# Patient Record
Sex: Male | Born: 1955 | Race: White | Hispanic: No | Marital: Married | Smoking: Current every day smoker
Health system: Southern US, Community
[De-identification: ages and names within clinical notes are randomized; demographics above are authoritative.]

## PROBLEM LIST (undated history)

## (undated) DIAGNOSIS — M1990 Unspecified osteoarthritis, unspecified site: Secondary | ICD-10-CM

## (undated) DIAGNOSIS — W3400XA Accidental discharge from unspecified firearms or gun, initial encounter: Secondary | ICD-10-CM

## (undated) DIAGNOSIS — Z9889 Other specified postprocedural states: Secondary | ICD-10-CM

## (undated) DIAGNOSIS — Z8679 Personal history of other diseases of the circulatory system: Secondary | ICD-10-CM

## (undated) DIAGNOSIS — I4819 Other persistent atrial fibrillation: Secondary | ICD-10-CM

## (undated) DIAGNOSIS — Z954 Presence of other heart-valve replacement: Secondary | ICD-10-CM

## (undated) DIAGNOSIS — I428 Other cardiomyopathies: Secondary | ICD-10-CM

## (undated) DIAGNOSIS — I099 Rheumatic heart disease, unspecified: Secondary | ICD-10-CM

## (undated) DIAGNOSIS — I442 Atrioventricular block, complete: Secondary | ICD-10-CM

## (undated) HISTORY — DX: Accidental discharge from unspecified firearms or gun, initial encounter: W34.00XA

## (undated) HISTORY — PX: TONSILLECTOMY: SUR1361

## (undated) HISTORY — DX: Other persistent atrial fibrillation: I48.19

## (undated) HISTORY — DX: Atrioventricular block, complete: I44.2

---
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
## 2011-10-09 ENCOUNTER — Emergency Department (HOSPITAL_COMMUNITY)
Admission: EM | Admit: 2011-10-09 | Discharge: 2011-10-09 | Disposition: A | Discharge status: Home / Self Care | Payer: BC Managed Care – PPO | Attending: Emergency Medicine | Admitting: Emergency Medicine

## 2011-10-09 ENCOUNTER — Encounter: Payer: Self-pay | Admitting: *Deleted

## 2011-10-09 DIAGNOSIS — H53149 Visual discomfort, unspecified: Secondary | ICD-10-CM | POA: Insufficient documentation

## 2011-10-09 DIAGNOSIS — H40219 Acute angle-closure glaucoma, unspecified eye: Secondary | ICD-10-CM | POA: Insufficient documentation

## 2011-10-09 DIAGNOSIS — H11419 Vascular abnormalities of conjunctiva, unspecified eye: Secondary | ICD-10-CM | POA: Insufficient documentation

## 2011-10-09 DIAGNOSIS — H5200 Hypermetropia, unspecified eye: Secondary | ICD-10-CM | POA: Insufficient documentation

## 2011-10-09 DIAGNOSIS — R51 Headache: Secondary | ICD-10-CM | POA: Insufficient documentation

## 2011-10-09 DIAGNOSIS — H409 Unspecified glaucoma: Secondary | ICD-10-CM | POA: Insufficient documentation

## 2011-10-09 DIAGNOSIS — R110 Nausea: Secondary | ICD-10-CM | POA: Insufficient documentation

## 2011-10-09 DIAGNOSIS — H5710 Ocular pain, unspecified eye: Secondary | ICD-10-CM | POA: Insufficient documentation

## 2011-10-09 MED ORDER — BRIMONIDINE TARTRATE 0.15 % OP SOLN
1.0000 [drp] | Freq: Once | OPHTHALMIC | Status: AC
  Administered 2011-10-09: 1 [drp] via OPHTHALMIC
  Filled 2011-10-09: qty 5

## 2011-10-09 MED ORDER — APRACLONIDINE HCL 1 % OP SOLN: 1.0000 [drp] | Freq: Once | OPHTHALMIC | Status: DC

## 2011-10-09 MED ORDER — HYDROCODONE-ACETAMINOPHEN 5-500 MG PO TABS: 1.0000 | ORAL_TABLET | Freq: Four times a day (QID) | ORAL | Status: AC | PRN

## 2011-10-09 MED ORDER — TIMOLOL MALEATE 0.5 % OP SOLN
1.0000 [drp] | Freq: Once | OPHTHALMIC | Status: AC
  Administered 2011-10-09: 1 [drp] via OPHTHALMIC

## 2011-10-09 MED ORDER — TETRACAINE HCL 0.5 % OP SOLN
OPHTHALMIC | Status: AC
  Administered 2011-10-09: 12:00:00
  Filled 2011-10-09: qty 2

## 2011-10-09 MED ORDER — ONDANSETRON HCL 4 MG PO TABS: 4.0000 mg | ORAL_TABLET | Freq: Three times a day (TID) | ORAL | Status: AC | PRN

## 2011-10-09 MED ORDER — FLUORESCEIN SODIUM 1 MG OP STRP
ORAL_STRIP | OPHTHALMIC | Status: AC
  Administered 2011-10-09: 12:00:00
  Filled 2011-10-09: qty 1

## 2011-10-09 MED ORDER — ACETAZOLAMIDE ER 500 MG PO CP12: 500.0000 mg | ORAL_CAPSULE | Freq: Two times a day (BID) | ORAL | Status: AC

## 2011-10-09 MED ORDER — ACETAZOLAMIDE SODIUM 500 MG IJ SOLR
500.0000 mg | Freq: Once | INTRAMUSCULAR | Status: AC
  Administered 2011-10-09: 500 mg via INTRAVENOUS
  Filled 2011-10-09: qty 500

## 2011-10-09 MED ORDER — PILOCARPINE HCL 2 % OP SOLN: 1.0000 [drp] | Freq: Once | OPHTHALMIC | Status: DC

## 2011-10-09 MED ORDER — DORZOLAMIDE HCL 2 % OP SOLN: 1.0000 [drp] | Freq: Two times a day (BID) | OPHTHALMIC | Status: AC

## 2011-10-09 MED ORDER — DORZOLAMIDE HCL 2 % OP SOLN
1.0000 [drp] | Freq: Three times a day (TID) | OPHTHALMIC | Status: DC
  Administered 2011-10-09 (×4): 1 [drp] via OPHTHALMIC
  Filled 2011-10-09: qty 10

## 2011-10-09 MED ORDER — OXYCODONE-ACETAMINOPHEN 5-325 MG PO TABS: 1.0000 | ORAL_TABLET | Freq: Once | ORAL | Status: DC

## 2011-10-09 MED ORDER — PREDNISOLONE ACETATE 1 % OP SUSP
1.0000 [drp] | Freq: Once | OPHTHALMIC | Status: AC
  Administered 2011-10-09: 1 [drp] via OPHTHALMIC
  Filled 2011-10-09: qty 1

## 2011-10-09 MED ORDER — PREDNISOLONE ACETATE 1 % OP SUSP: 1.0000 [drp] | Freq: Four times a day (QID) | OPHTHALMIC | Status: AC

## 2011-10-09 MED ORDER — OXYCODONE-ACETAMINOPHEN 5-325 MG PO TABS
ORAL_TABLET | ORAL | Status: AC
  Administered 2011-10-09: 1 via GASTROSTOMY
  Filled 2011-10-09: qty 1

## 2011-10-09 MED ORDER — OXYCODONE-ACETAMINOPHEN 5-325 MG PO TABS
ORAL_TABLET | ORAL | Status: AC
  Administered 2011-10-09: 1
  Filled 2011-10-09: qty 1

## 2011-10-09 MED ORDER — PILOCARPINE HCL 2 % OP SOLN
1.0000 [drp] | Freq: Once | OPHTHALMIC | Status: AC
  Administered 2011-10-09: 1 [drp] via OPHTHALMIC
  Filled 2011-10-09: qty 15

## 2011-10-09 MED ORDER — DORZOLAMIDE HCL-TIMOLOL MAL 2-0.5 % OP SOLN
1.0000 [drp] | Freq: Once | OPHTHALMIC | Status: AC
  Administered 2011-10-09: 1 [drp] via OPHTHALMIC
  Filled 2011-10-09: qty 10

## 2011-10-09 MED ORDER — TIMOLOL MALEATE 0.5 % OP SOLN: 1.0000 [drp] | Freq: Two times a day (BID) | OPHTHALMIC | Status: AC

## 2011-10-09 MED ORDER — PILOCARPINE HCL 2 % OP SOLN
1.0000 [drp] | Freq: Once | OPHTHALMIC | Status: AC
  Administered 2011-10-09: 1 [drp] via OPHTHALMIC

## 2011-10-09 MED ORDER — ONDANSETRON HCL 4 MG/2ML IJ SOLN
INTRAMUSCULAR | Status: AC
  Filled 2011-10-09: qty 2

## 2011-10-09 MED ORDER — TIMOLOL MALEATE 0.5 % OP SOLN: 1.0000 [drp] | Freq: Once | OPHTHALMIC | Status: DC

## 2011-10-09 MED ORDER — ONDANSETRON HCL 4 MG/2ML IJ SOLN
INTRAMUSCULAR | Status: AC
  Administered 2011-10-09: 4 mg via INTRAVENOUS
  Filled 2011-10-09: qty 2

## 2011-10-09 MED ORDER — PILOCARPINE HCL 2 % OP SOLN: 1.0000 [drp] | Freq: Four times a day (QID) | OPHTHALMIC | Status: AC

## 2011-10-09 MED ORDER — DORZOLAMIDE HCL-TIMOLOL MAL 2-0.5 % OP SOLN
1.0000 [drp] | Freq: Once | OPHTHALMIC | Status: DC
  Filled 2011-10-09: qty 10

## 2011-10-09 MED ORDER — ACETAZOLAMIDE 250 MG PO TABS
500.0000 mg | ORAL_TABLET | Freq: Once | ORAL | Status: AC
  Administered 2011-10-09: 500 mg via ORAL
  Filled 2011-10-09: qty 2

## 2011-10-09 MED ORDER — ONDANSETRON HCL 4 MG/2ML IJ SOLN
4.0000 mg | Freq: Once | INTRAMUSCULAR | Status: DC
  Administered 2011-10-09: 4 mg via INTRAVENOUS

## 2011-10-09 NOTE — ED Notes (Signed)
Pt amb to [facility].  C/o slight nasuea.  Family member at bedside

## 2011-10-09 NOTE — ED Notes (Signed)
Pharmacy notified to bring medicines

## 2011-10-09 NOTE — ED Notes (Signed)
Family at bedside. 

## 2011-10-09 NOTE — ED Notes (Signed)
MD at bedside. 

## 2011-10-09 NOTE — ED Notes (Signed)
[name] [name] has requested to hold meds for now

## 2011-10-09 NOTE — Consult Note (Signed)
Reason for consult:  Angle closure OD.   ED Course: Please see initial ED evaluation and triage note by Dr. Blanca Friend re: patient's presentation and ED course.  Agree with note as documented by Dr. Thane Edu note and initial treatment (including drops as documented, and IV Diamox) as discussed with me by telephone consult.   HPI: [name] [name] is an 55 y.o. male.   Currently patient reports that headache and nausea have improved.  Vision OD seems somewhat improved but still somewhat blurry.  No other complaints.    PMH:  History reviewed. No pertinent past medical history.  History reviewed. No pertinent past surgical history.  History reviewed. No pertinent family history.  Meds:  ASA 81mg , MVI  Social History:  reports that he has been smoking.  He does not have any smokeless tobacco history on file. He reports that he does not drink alcohol or use illicit drugs.  Allergies: No Known Allergies  ROS: negative per 10 systems reviewed other than HPI   Exam:  Blood pressure 136/70, pulse 72, temperature 97.6 F (36.4 C), temperature source Oral, resp. rate 18, height 5\' 10"  (1.778 m), weight 74.844 kg (165 lb), SpO2 98.00%.  Gen: NAD; alert; oriented.  VA cc:  OD 20/200 @ dist    Pupils:   OD mid-dilated, non reactive           OS miotic/pinpoint; nonreactive  IOP (T pen)  OD 23 - 26  OS 9   CVF: OD full to CF   OS full to CF  Motility:  OD full ductions  OS full ductions  Balance/alignment:  Ortho by Phoebe Perch   Slit lamp examination:                                 OD                                       External/adnexa: Normal                                      Lids/lashes:        Normal                                      Conjunctiva        White, quiet        Cornea:              Stromal folds; mild residual microcystic change; clear peripherally                 AC:                     Shallow; no cell; 1+ flare                         Iris:                      Normal        Lens:                  Clear; pigment on [facility]  OS                                       External/adnexa: Normal                                      Lids/lashes:        Normal                                      Conjunctiva        White, quiet        Cornea:              Clear                  AC:                     Narrow; quiet                              Iris:                     Normal        Lens:                  Clear       **Final IOP OD (see ED note re: course of meds):  22, 22 by T Pen 24 by Appl  A/P:  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 - With medical management, acute attack broken successfully as documented above. -- Recommend:  D/C to home with plan for follow up tomorrow with me at 1400 for IOP check. Cosopt (equiv) BID OD Prednisolone acetate QID OD Pilocarpine QID OD Diamox ER 500mg  PO BID.  Anticipate YAG PI OD on Tuesday 10/12/11.     All of the above information was relayed to the patient and/or patient family.  Ophthalmic warning signs and symptoms were reviewed, and clear instructions for immediate phone contact and/or immediate return to the ED or clinic were provided should any of these signs or symptoms occur.  Follow up contact information was provided.  All questions were answered.  Larenda Reedy 10/09/2011, 5:42 PM   [facility] [phone]

## 2011-10-09 NOTE — ED Provider Notes (Signed)
History     CSN: [id number] Arrival date & time: 10/09/2011 10:52 AM   First MD Initiated Contact with Patient 10/09/11 1125      Chief Complaint  Patient presents with  . Eye Problem    (Consider location/radiation/quality/duration/timing/severity/associated sxs/prior treatment) HPI [name] [name] is a 55 year old man with hyperopia who presents with a one day history of R eye pain.  He went to his optometrist [name] [name] yesterday for a regular eye exam for new glasses.  His eyes were dilated during that exam.  When he left the office, his right eye was hurting him a little bit.  This progressed throughout the night, and he awoke with bad R eye pain at 1am and has been awake with pain ever since.  The dilation in his other eye, the L eye, lasted 4-5 hours after his eye exam yesterday.  He also reports nausea, but he has not vomited yet.  He tried using an ice pack, but it did not help much.  Very sensitive to light.  The eye drops he received for dilation at the optometrist office were Tropicamide and phenylephrine per his daughter, who is an ophthalmic technician at [name] [name] office and actually gave these drops to her father yesterday.    Patient is hyperopic.  Per his daughter, his refraction OD is +4.00 -1.50 and OS is +3.00 -0.75.   History reviewed. No pertinent past medical history.  History reviewed. No pertinent past surgical history. No eye surgeries.  History reviewed. No pertinent family history.  Lung cancer in both parents.  History  Substance Use Topics  . Smoking status: Current Everyday Smoker -- 1.0 packs/day  . Smokeless tobacco: Not on file  . Alcohol Use: No     Review of Systems Positive for nausea, no vomiting.  No fever.  No headache.   Allergies  Review of patient's allergies indicates no known allergies.  Home Medications   Current Outpatient Rx  Name Route Sig Dispense Refill  . ASPIRIN EC 81 MG PO TBEC Oral Take 81 mg by mouth every 4  (four) hours as needed. For pain.     [name] [name] PO TABS Oral Take 1 tablet by mouth daily.        BP 132/76  Pulse 61  Temp(Src) 97.6 F (36.4 C) (Oral)  Resp 20  Ht 5\' 10"  (1.778 m)  Wt 165 lb (74.844 kg)  BMI 23.68 kg/m2  SpO2 98%  Physical Exam  Patient keeps his eyes closed unless asked.  Visual Acuity: OD: Count Fingers at 2 ft with glasses (per patient at baseline his right eye sees well) OS: 20/20 with glasses  IOP: OD: 34 mmHg, 28 on recheck, both with tonopen OS: 12 mmHg  Pupils: OD: 5-66mm, non reactive to direct or consensual light OS: 1-51mm, reactive to light stimuli in either eye  Other exam findings: OD: Conjunctival injection.  Marked cornea microcytic edema present.  Lens appears to be anterior and obstructing the pupil.  On slit lamp exam without gonioscope the angles appear partially closed, partially open.  No definite cell or flare in anterior chamber.  Pigment present on anterior lens capsule.  Gonioscopy not performed.  OS: Conjunctiva clear. Cornea clear.  No cell or flare.  Angles are markedly shallow and may be partially closed as well based on slitlamp exam.  Lens does not appear to be obstructing pupil.  Gonioscopy not performed.   ED Course  Procedures (including critical care time)  Pressures OD on initial exam 34,28 mmHg by tonopen Ophthalmology consulted First set of eye drops given 1pm. (Dorzolamide, Timolol, Alphagan, Pilocarpine one drop of each) OD pressure 36,39,40 mmHg at 1:04pm.   Diamox 500mg  PO given.  Patient vomited about 30 min later  OD pressure 37, 24, 29, 40 mmHg at 1:20pm by tonopen Second set of eye drops given 1:25pm.  (Dorzolamide, Timolol, Alphagan, Pilocarpine one drop of each) Extra drop of pilocarpine given per ophtho consultant recommendation.  OD pressure 28, 26, 29, 35 at 1:55pm by tonopen Exam unchanged, R pupil may be slightly smaller but still non reactive and anterior displacement of lens/pupillary  block persists Third set of eye drops given 1:50pm (Dorzolamide, Timolol, Alphagan one drop of each, Pilocarpine two drops)  OD pressure 35,28,28,26 at 2:05pm by tonopen Right pupil ~2mm, seems a bit improved from initial Additional pilocarpine drop given at 2:15pm. Zofran 4mg  IV given at 14:14 Pred Forte 1% one drop given at 15:01 Diamox 500mg  IV given at 15:02.  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  He agreed with our diagnosis and guided the above described therapy regimen to treat this patient's acute angle closure glaucoma.   Assessment/Plan: Patient to be discharged from ED to home Has fu tomorrow with [name] [name] for IOP check.  Will have repeat IOP check Monday and then be scheduled for laser peripheral iridotomy OD on Tuesday with [name] [name] Discharge Eye Drop regimen (all in R eye):   -Timolol BID  -Dorzolamide BID  -Pilocarpine 4x per day  -Pred Forte 4x per day  Diamox ER 500mg  PO BID Zofran ODT 4mg  PO Q6HPRN for nausea Tylenol 3 Q6HPRN for pain  [name] Friend, MD 10/09/11 1630

## 2011-10-09 NOTE — ED Notes (Signed)
Opthomologist at bedside.

## 2011-10-09 NOTE — ED Notes (Signed)
While in [place], pt states he vomited. MD aware and orders recieved

## 2011-10-09 NOTE — ED Notes (Signed)
Pt went to eye doctor yesterday for exam.  Pt's right eye has remained dilated.  He has pain to same.  Pt states he has visual disturbance with same.

## 2016-09-01 DIAGNOSIS — J0100 Acute maxillary sinusitis, unspecified: Secondary | ICD-10-CM | POA: Diagnosis not present

## 2016-09-01 DIAGNOSIS — R05 Cough: Secondary | ICD-10-CM | POA: Diagnosis not present

## 2016-10-02 ENCOUNTER — Encounter (HOSPITAL_COMMUNITY): Payer: Self-pay

## 2016-10-02 ENCOUNTER — Inpatient Hospital Stay (HOSPITAL_COMMUNITY)
Admission: EM | Admit: 2016-10-02 | Discharge: 2016-10-06 | DRG: 308 | Disposition: A | Discharge status: Home / Self Care | Payer: BLUE CROSS/BLUE SHIELD | Attending: Internal Medicine | Admitting: Internal Medicine

## 2016-10-02 DIAGNOSIS — I340 Nonrheumatic mitral (valve) insufficiency: Secondary | ICD-10-CM | POA: Diagnosis not present

## 2016-10-02 DIAGNOSIS — F17200 Nicotine dependence, unspecified, uncomplicated: Secondary | ICD-10-CM | POA: Diagnosis present

## 2016-10-02 DIAGNOSIS — I051 Rheumatic mitral insufficiency: Secondary | ICD-10-CM | POA: Diagnosis not present

## 2016-10-02 DIAGNOSIS — Z791 Long term (current) use of non-steroidal anti-inflammatories (NSAID): Secondary | ICD-10-CM | POA: Diagnosis not present

## 2016-10-02 DIAGNOSIS — I429 Cardiomyopathy, unspecified: Secondary | ICD-10-CM | POA: Diagnosis not present

## 2016-10-02 DIAGNOSIS — Z7982 Long term (current) use of aspirin: Secondary | ICD-10-CM

## 2016-10-02 DIAGNOSIS — Z79899 Other long term (current) drug therapy: Secondary | ICD-10-CM

## 2016-10-02 DIAGNOSIS — I509 Heart failure, unspecified: Secondary | ICD-10-CM

## 2016-10-02 DIAGNOSIS — I5043 Acute on chronic combined systolic (congestive) and diastolic (congestive) heart failure: Secondary | ICD-10-CM | POA: Diagnosis not present

## 2016-10-02 DIAGNOSIS — R0902 Hypoxemia: Secondary | ICD-10-CM

## 2016-10-02 DIAGNOSIS — J90 Pleural effusion, not elsewhere classified: Secondary | ICD-10-CM | POA: Diagnosis not present

## 2016-10-02 DIAGNOSIS — I4891 Unspecified atrial fibrillation: Secondary | ICD-10-CM | POA: Diagnosis not present

## 2016-10-02 DIAGNOSIS — I5041 Acute combined systolic (congestive) and diastolic (congestive) heart failure: Secondary | ICD-10-CM | POA: Diagnosis not present

## 2016-10-02 DIAGNOSIS — I5021 Acute systolic (congestive) heart failure: Secondary | ICD-10-CM | POA: Diagnosis not present

## 2016-10-02 DIAGNOSIS — I480 Paroxysmal atrial fibrillation: Secondary | ICD-10-CM | POA: Diagnosis not present

## 2016-10-02 LAB — BASIC METABOLIC PANEL
ANION GAP: 8 (ref 5–15)
BUN: 16 mg/dL (ref 6–20)
CALCIUM: 8.8 mg/dL — AB (ref 8.9–10.3)
CHLORIDE: 110 mmol/L (ref 101–111)
CO2: 20 mmol/L — AB (ref 22–32)
CREATININE: 0.98 mg/dL (ref 0.61–1.24)
GFR calc non Af Amer: >60 mL/min (ref >60)
GLUCOSE: 143 mg/dL — AB (ref 65–99)
Potassium: 4 mmol/L (ref 3.5–5.1)
Sodium: 138 mmol/L (ref 135–145)

## 2016-10-02 LAB — BRAIN NATRIURETIC PEPTIDE: B NATRIURETIC PEPTIDE 5: 333.2 pg/mL — AB (ref 0.0–100.0)

## 2016-10-02 LAB — CBC
HEMATOCRIT: 40.7 % (ref 39.0–52.0)
HEMOGLOBIN: 13.4 g/dL (ref 13.0–17.0)
MCH: 30.5 pg (ref 26.0–34.0)
MCHC: 32.9 g/dL (ref 30.0–36.0)
MCV: 92.5 fL (ref 78.0–100.0)
Platelets: 218 10*3/uL (ref 150–400)
RBC: 4.4 MIL/uL (ref 4.22–5.81)
RDW: 14.6 % (ref 11.5–15.5)
WBC: 9.3 10*3/uL (ref 4.0–10.5)

## 2016-10-02 LAB — I-STAT TROPONIN, ED: Troponin i, poc: 0.01 ng/mL (ref 0.00–0.08)

## 2016-10-02 LAB — TSH: TSH: 1.303 u[IU]/mL (ref 0.350–4.500)

## 2016-10-02 MED ORDER — FUROSEMIDE 10 MG/ML IJ SOLN
20.0000 mg | Freq: Once | INTRAMUSCULAR | Status: AC
  Administered 2016-10-02: 20 mg via INTRAVENOUS
  Filled 2016-10-02: qty 2

## 2016-10-02 MED ORDER — ACETAMINOPHEN 325 MG PO TABS
650.0000 mg | ORAL_TABLET | ORAL | Status: DC | PRN
  Administered 2016-10-03: 650 mg via ORAL
  Filled 2016-10-02: qty 2

## 2016-10-02 MED ORDER — APIXABAN 5 MG PO TABS
5.0000 mg | ORAL_TABLET | Freq: Two times a day (BID) | ORAL | Status: DC
  Administered 2016-10-02 – 2016-10-06 (×8): 5 mg via ORAL
  Filled 2016-10-02 (×8): qty 1

## 2016-10-02 MED ORDER — DILTIAZEM LOAD VIA INFUSION
20.0000 mg | Freq: Once | INTRAVENOUS | Status: AC
  Administered 2016-10-02: 20 mg via INTRAVENOUS
  Filled 2016-10-02: qty 20

## 2016-10-02 MED ORDER — FUROSEMIDE 20 MG PO TABS
20.0000 mg | ORAL_TABLET | Freq: Once | ORAL | Status: AC
  Administered 2016-10-02: 20 mg via ORAL
  Filled 2016-10-02: qty 1

## 2016-10-02 MED ORDER — DILTIAZEM HCL 100 MG IV SOLR
5.0000 mg/h | INTRAVENOUS | Status: DC
  Administered 2016-10-02 – 2016-10-04 (×4): 7.5 mg/h via INTRAVENOUS
  Administered 2016-10-05: 5 mg/h via INTRAVENOUS
  Filled 2016-10-02 (×4): qty 100

## 2016-10-02 MED ORDER — ONDANSETRON HCL 4 MG/2ML IJ SOLN: 4.0000 mg | Freq: Four times a day (QID) | INTRAMUSCULAR | Status: DC | PRN

## 2016-10-02 MED ORDER — DILTIAZEM HCL 100 MG IV SOLR
5.0000 mg/h | INTRAVENOUS | Status: DC
  Administered 2016-10-02: 5 mg/h via INTRAVENOUS
  Filled 2016-10-02 (×2): qty 100

## 2016-10-02 MED ORDER — GUAIFENESIN-DM 100-10 MG/5ML PO SYRP
5.0000 mL | ORAL_SOLUTION | ORAL | Status: DC | PRN
  Administered 2016-10-02: 5 mL via ORAL
  Filled 2016-10-02: qty 5

## 2016-10-02 NOTE — ED Notes (Signed)
Report called to rn on 2w 

## 2016-10-02 NOTE — ED Triage Notes (Signed)
Pt presents with 1 month h/o shortness of breath and malaise.  Pt reports last night was worse, was seen at Urgent care and referred here for new onset afib.

## 2016-10-02 NOTE — H&P (Signed)
History and Physical   Patient ID: [name] [name], MRN: [id number], DOB: Mar 14, 1956   Date of Encounter: 10/02/2016, 2:34 PM  Primary Care Provider: [facility] Cardiologist: New    Electrophysiologist: new  Chief Complaint:  Atrial fibrillation  History of Present Illness: [name] [name] is a 60 y.o. male presenting to the hospital with acute congestive heart failure and found to be in atrial fibrillation.  He was seen a couple of weeks ago with a 2 week antecedent history of a flulike illness characterized by fever and chills myalgias and cough. He was treated with antibiotics with interval improvement but then worsening over the last couple of weeks. He has noted increasing dyspnea on exertion, persistent cough and last night had orthopnea and nocturnal dyspnea. He has had no palpitations. He has no peripheral edema.  Thromboembolic risk profile is notable for nothing although I worry about cardiomyopathy.  His CHADS-VASc score 0.  He denies symptoms of OSA    Past Medical History:  Diagnosis Date  . Atrial fibrillation (HCC) 10/02/2016   Chads Vasc 0     History reviewed. No pertinent surgical history.    Prior to Admission medications   Medication Sig Start Date End Date Taking? Authorizing Provider  aspirin 325 MG tablet Take 325 mg by mouth every 6 (six) hours as needed for mild pain.   Yes Historical Provider, MD  ibuprofen (ADVIL,MOTRIN) 200 MG tablet Take 200 mg by mouth every 6 (six) hours as needed for moderate pain.   Yes Historical Provider, MD  Multiple Vitamin (MULTIVITAMIN) tablet Take 1 tablet by mouth daily.   Yes Historical Provider, MD      Allergies: No Known Allergies   Social History:  The patient  reports that he has been smoking.  He has been smoking about 1.00 pack per day. He uses smokeless tobacco. He reports that he does not drink alcohol or use drugs.   Family History:  The patient's family history is not on file.     ROS:  Please see the history of present illness.     All other systems reviewed and negative.   Vital Signs: Blood pressure 94/77, pulse 106, temperature 97.7 F (36.5 C), temperature source Oral, resp. rate (!) 32, height 5\' 10"  (1.778 m), weight 171 lb (77.6 kg), SpO2 93 %.  PHYSICAL EXAM: General:  Well nourished, well developed, in no acute distress  HEENT: normal Lymph: no adenopathy Neck: >10 Endocrine:  No thryomegaly Vascular: No carotid bruits; FA pulses 2+ bilaterally without bruits  Cardiac:  Irregularly irregular rate and rhythm with a 2/6 systolic murmur at the apex. PMI is displaced and sustained Lungs:  clear to auscultation bilaterally, no wheezing, rhonchi or rales  Abd: soft, nontender, no hepatomegaly  Ext: no edema Musculoskeletal:  No deformities, BUE and BLE strength normal and equal Skin: warm and dry  Neuro:  CNs 2-12 intact, no focal abnormalities noted Psych:  Normal affect    ECG personally reviewed  Atrial fibrillation with a rapid rate at about 110 Intervals-/09/46 2R in lead V1 Axis 100   Labs:   Lab Results  Component Value Date   WBC 9.3 10/02/2016   HGB 13.4 10/02/2016   HCT 40.7 10/02/2016   MCV 92.5 10/02/2016   PLT 218 10/02/2016     Recent Labs Lab 10/02/16 1100  NA 138  K 4.0  CL 110  CO2 20*  BUN 16  CREATININE 0.98  CALCIUM 8.8*  GLUCOSE 143*  TSH normal  Radiology/Studies:   No results found.    ASSESSMENT AND PLAN:   1. Atrial fibrillation with a rapid ventricular response 2. Acute congestive heart failure 3. Mitral regurgitation ? Functional 4. ? Cardiomyopathy  He will need to be admitted with his congestive heart failure and rapid rate. I reviewed issues with the family related to mechanisms of congestive failure and need for anticoagulation in the short-term and the anticipation that we would undertake cardioversion with TEE guidance on Monday. 1. IV diuresis 2. Continued rate control using  diltiazem 3. Continue anticoagulation with apixoban 4. Echo in a.m. to look at LV function and LA size 5. TEE guided cardioversion on Monday    Tommie ArdSigned,  Steven Klein 10/02/2016 2:34 PM  Pager # [phone]

## 2016-10-02 NOTE — Progress Notes (Signed)
Patient arrived from ED, placed on monitor and ccmd made aware, vital signs obtained patient has diltiazem infusing, will monitor patient .Admiral Marcucci, Randall An RN

## 2016-10-02 NOTE — Progress Notes (Signed)
Attempted to call ED and get report. RN stated would call back. Reeves Dam, Randall An RN

## 2016-10-02 NOTE — ED Notes (Signed)
The pt reports that he feels fine  Still in slow af  Will not keep 02 on  He reports that he does not need it  Visiting with family

## 2016-10-02 NOTE — ED Provider Notes (Signed)
[facility]-EMERGENCY DEPT Provider Note   CSN: [id number] Arrival date & time: 10/02/16  1114     History   Chief Complaint No chief complaint on file.   HPI [name] [name] is a 60 y.o. male.  HPI Patient presents to the emergency room for evaluation of new onset atrial fibrillation. Patient started having some respiratory symptoms several weeks ago. He went to his primary doctor and was diagnosed with a respiratory infection. He was put on a course of antibiotics and felt like his symptoms improved. Over the next week however the symptoms returned and he started to have some mild shortness of breath. This was approximately 3 weeks ago. The symptoms have persisted and became more severe yesterday. He noticed he was getting short of breath doing minimal activities. He had trouble lying flat. He went to an urgent care today. They did a chest x-ray that suggested some pulmonary edema. In the office he had an EKG that showed atrial fibrillation. Patient has no known history of heart disease. Denies any alcohol use. He denies any history of thyroid disease. He denies any trouble with chest pain. No fever, chills or other complaints. Past Medical History:  Diagnosis Date  . Atrial fibrillation (HCC) 10/02/2016   Chads Vasc 0    Patient Active Problem List   Diagnosis Date Noted  . Atrial fibrillation (HCC) 10/02/2016    History reviewed. No pertinent surgical history.     Home Medications    Prior to Admission medications   Medication Sig Start Date End Date Taking? Authorizing Provider  aspirin 325 MG tablet Take 325 mg by mouth every 6 (six) hours as needed for mild pain.   Yes Historical Provider, MD  ibuprofen (ADVIL,MOTRIN) 200 MG tablet Take 200 mg by mouth every 6 (six) hours as needed for moderate pain.   Yes Historical Provider, MD  Multiple Vitamin (MULTIVITAMIN) tablet Take 1 tablet by mouth daily.   Yes Historical Provider, MD    Family History History reviewed. No  pertinent family history.  Social History Social History  Substance Use Topics  . Smoking status: Current Every Day Smoker    Packs/day: 1.00  . Smokeless tobacco: Current User  . Alcohol use No     Allergies   Patient has no known allergies.   Review of Systems Review of Systems  All other systems reviewed and are negative.    Physical Exam Updated Vital Signs BP 108/74   Pulse 68   Temp 97.7 F (36.5 C) (Oral)   Resp (!) 27   Ht 5\' 10"  (1.778 m)   Wt 77.6 kg   SpO2 96%   BMI 24.54 kg/m   Physical Exam  Constitutional: He appears well-developed and well-nourished. No distress.  HENT:  Head: Normocephalic and atraumatic.  Right Ear: External ear normal.  Left Ear: External ear normal.  Eyes: Conjunctivae are normal. Right eye exhibits no discharge. Left eye exhibits no discharge. No scleral icterus.  Neck: Neck supple. No tracheal deviation present. No thyromegaly present.  Cardiovascular: Intact distal pulses.  An irregularly irregular rhythm present. Tachycardia present.   Pulmonary/Chest: Effort normal. No stridor. No respiratory distress. He has no wheezes. He has rales.  Bilateral   Abdominal: Soft. Bowel sounds are normal. He exhibits no distension. There is no tenderness. There is no rebound and no guarding.  Musculoskeletal: He exhibits no edema or tenderness.  Neurological: He is alert. He has normal strength. No cranial nerve deficit (no facial droop, extraocular movements intact,  no slurred speech) or sensory deficit. He exhibits normal muscle tone. He displays no seizure activity. Coordination normal.  Skin: Skin is warm and dry. No rash noted. He is not diaphoretic.  Psychiatric: He has a normal mood and affect.  Nursing note and vitals reviewed.    ED Treatments / Results  Labs (all labs ordered are listed, but only abnormal results are displayed) Labs Reviewed  BASIC METABOLIC PANEL - Abnormal; Notable for the following:       Result Value     CO2 20 (*)    Glucose, Bld 143 (*)    Calcium 8.8 (*)    All other components within normal limits  BRAIN NATRIURETIC PEPTIDE - Abnormal; Notable for the following:    B Natriuretic Peptide 333.2 (*)    All other components within normal limits  CBC  TSH  I-STAT TROPOININ, ED    EKG  EKG Interpretation  Date/Time:  Saturday October 02 2016 11:27:45 EST Ventricular Rate:  149 PR Interval:    QRS Duration: 88 QT Interval:  297 QTC Calculation: 468 R Axis:   100 Text Interpretation:  Atrial fibrillation Ventricular premature complex Right axis deviation Borderline T wave abnormalities No old tracing to compare Confirmed by Solene Hereford  MD-J, Mikle Sternberg ([id number]) on 10/02/2016 11:30:16 AM       Radiology UC CXR reviewed,  Image is available on Disc  Procedures Procedures (including critical care time)  Medications Ordered in ED Medications  diltiazem (CARDIZEM) 1 mg/mL load via infusion 20 mg (20 mg Intravenous Bolus from Bag 10/02/16 1148)    And  diltiazem (CARDIZEM) 100 mg in dextrose 5 % 100 mL (1 mg/mL) infusion (7.5 mg/hr Intravenous Rate/Dose Change 10/02/16 1243)  furosemide (LASIX) tablet 20 mg (20 mg Oral Given 10/02/16 1306)     Initial Impression / Assessment and Plan / ED Course  I have reviewed the triage vital signs and the nursing notes.  Pertinent labs & imaging results that were available during my care of the patient were reviewed by me and considered in my medical decision making (see chart for details).  Clinical Course as of Oct 02 1620  Sat Oct 02, 2016  1145 CXR from the UC reviewed.  Vascular congestion, bilateral pleural effusions  [JK]  1152 Patient's EKG is consistent with atrial fibrillation with a rapid ventricular rate. I will start the patient Cardizem drip.  He is not a candidate for ED cardioversion considering the long duration of his symptoms.`  [JK]  1230 HR has responded to cardizem drip.  Now in the low 100s.  Initial labs are reassuring.   Troponin normal.  Will consult with cardiology  [JK]  1251 Discussed with Dr Diona Browner. Will see pt in the ED  [JK]    Clinical Course User Index [JK] Linwood Dibbles, MD    Final Clinical Impressions(s) / ED Diagnoses   Final diagnoses:  Atrial fibrillation with rapid ventricular response [facility])    New Prescriptions New Prescriptions   No medications on file     Linwood Dibbles, MD 10/02/16 1621

## 2016-10-02 NOTE — ED Notes (Signed)
Pt c/o a burning sensation in his chest that has been there all day

## 2016-10-03 ENCOUNTER — Inpatient Hospital Stay (HOSPITAL_COMMUNITY): Payer: BLUE CROSS/BLUE SHIELD

## 2016-10-03 DIAGNOSIS — I4891 Unspecified atrial fibrillation: Secondary | ICD-10-CM

## 2016-10-03 LAB — ECHOCARDIOGRAM COMPLETE
HEIGHTINCHES: 70 in
Weight: 2736 oz

## 2016-10-03 LAB — BASIC METABOLIC PANEL
ANION GAP: 9 (ref 5–15)
BUN: 13 mg/dL (ref 6–20)
CALCIUM: 8.9 mg/dL (ref 8.9–10.3)
CO2: 26 mmol/L (ref 22–32)
Chloride: 105 mmol/L (ref 101–111)
Creatinine, Ser: 1.1 mg/dL (ref 0.61–1.24)
Glucose, Bld: 109 mg/dL — ABNORMAL HIGH (ref 65–99)
POTASSIUM: 4.3 mmol/L (ref 3.5–5.1)
Sodium: 140 mmol/L (ref 135–145)

## 2016-10-03 MED ORDER — ALPRAZOLAM 0.25 MG PO TABS
0.2500 mg | ORAL_TABLET | Freq: Every evening | ORAL | Status: AC | PRN
  Administered 2016-10-03: 0.25 mg via ORAL
  Filled 2016-10-03: qty 1

## 2016-10-03 MED ORDER — ALPRAZOLAM 0.25 MG PO TABS
0.2500 mg | ORAL_TABLET | Freq: Once | ORAL | Status: AC
  Administered 2016-10-03: 0.25 mg via ORAL
  Filled 2016-10-03: qty 1

## 2016-10-03 NOTE — Progress Notes (Signed)
Pt has six bits of SVT, per CCMD. PT alert, oriented in no apparent distress. Asymptomatic, we'll continue to monitor.

## 2016-10-03 NOTE — Progress Notes (Signed)
Patient Name: [name] KocherJohn P Maron Date of Encounter: 10/03/2016    Hospital Problem List     Active Problems:   Atrial fibrillation (HCC)     Subjective   Rate control improved. Out 2.1L. Feeling much improved with less SOB.  Inpatient Medications    Scheduled Meds: . apixaban  5 mg Oral BID   Continuous Infusions: . diltiazem (CARDIZEM) infusion 7.5 mg/hr (10/03/16 0631)   PRN Meds: acetaminophen, guaiFENesin-dextromethorphan, ondansetron (ZOFRAN) IV   Vital Signs    Vitals:   10/02/16 1645 10/02/16 1824 10/02/16 2036 10/03/16 0700  BP: 99/82 113/71 117/75 114/60  Pulse: 105 100 (!) 104 81  Resp: (!) 28 20 20 18   Temp:  98.2 F (36.8 C) 97.9 F (36.6 C) 97.8 F (36.6 C)  TempSrc:  Oral Oral Oral  SpO2: 93% 92% 92% 93%  Weight:      Height:        Intake/Output Summary (Last 24 hours) at 10/03/16 1028 Last data filed at 10/03/16 1000  Gross per 24 hour  Intake           419.63 ml  Output             2550 ml  Net         -2130.37 ml   Filed Weights   10/02/16 1127  Weight: 171 lb (77.6 kg)    Physical Exam    GEN: Well nourished, well developed, in no acute distress.  HEENT: Grossly normal.  Neck: Supple, no JVD, carotid bruits, or masses. Cardiac: iRRR, 2/6  Murmur at the apexrubs, or gallops. No clubbing, cyanosis, edema.  Radials/DP/PT 2+ and equal bilaterally.  Respiratory:  Respirations regular and unlabored, clear to auscultation bilaterally. GI: Soft, nontender, nondistended, BS + x 4. MS: no deformity or atrophy. Skin: warm and dry, no rash. Neuro:  Strength and sensation are intact. Psych: AAOx3.  Normal affect.  Labs    CBC  Recent Labs  10/02/16 1100  WBC 9.3  HGB 13.4  HCT 40.7  MCV 92.5  PLT 218   Basic Metabolic Panel  Recent Labs  10/02/16 1100 10/03/16 0215  NA 138 140  K 4.0 4.3  CL 110 105  CO2 20* 26  GLUCOSE 143* 109*  BUN 16 13  CREATININE 0.98 1.10  CALCIUM 8.8* 8.9   Liver Function Tests No results  for input(s): AST, ALT, ALKPHOS, BILITOT, PROT, ALBUMIN in the last 72 hours. No results for input(s): LIPASE, AMYLASE in the last 72 hours. Cardiac Enzymes No results for input(s): CKTOTAL, CKMB, CKMBINDEX, TROPONINI in the last 72 hours. BNP Invalid input(s): POCBNP D-Dimer No results for input(s): DDIMER in the last 72 hours. Hemoglobin A1C No results for input(s): HGBA1C in the last 72 hours. Fasting Lipid Panel No results for input(s): CHOL, HDL, LDLCALC, TRIG, CHOLHDL, LDLDIRECT in the last 72 hours. Thyroid Function Tests  Recent Labs  10/02/16 1100  TSH 1.303    Telemetry    Atrial fibrillaiton - Personally Reviewed  ECG    Atrial fibrillation, RAD - Personally Reviewed  Radiology    No results found.  Cardiac Studies   TTE pending  Patient Profile     [name] KocherJohn P [name] is a 60 y.o. male presenting to the hospital with acute congestive heart failure and found to be in atrial fibrillation.  Assessment & Plan    1. Atrial fibrillation: has been started on Eliquis for anticoagulation. Plan for TEE/CV tomorrow. Rate control with diltiazem.  2.  Acute heart failure: appears to be in HF with PND and orthopnea. TTE pending. Continue diuresis.  Out 2.1L.  Signed, Onda Kattner Jorja Loa, MD  10/03/2016, 10:28 AM

## 2016-10-03 NOTE — Discharge Instructions (Addendum)
Atrial Fibrillation Atrial fibrillation is a type of irregular or rapid heartbeat (arrhythmia). In atrial fibrillation, the heart quivers continuously in a chaotic pattern. This occurs when parts of the heart receive disorganized signals that make the heart unable to pump blood normally. This can increase the risk for stroke, heart failure, and other heart-related conditions. There are different types of atrial fibrillation, including:  Paroxysmal atrial fibrillation. This type starts suddenly, and it usually stops on its own shortly after it starts.  Persistent atrial fibrillation. This type often lasts longer than a week. It may stop on its own or with treatment.  Long-lasting persistent atrial fibrillation. This type lasts longer than 12 months.  Permanent atrial fibrillation. This type does not go away. Talk with your health care provider to learn about the type of atrial fibrillation that you have. What are the causes? This condition is caused by some heart-related conditions or procedures, including:  A heart attack.  Coronary artery disease.  Heart failure.  Heart valve conditions.  High blood pressure.  Inflammation of the sac that surrounds the heart (pericarditis).  Heart surgery.  Certain heart rhythm disorders, such as Wolf-Parkinson-White syndrome. Other causes include:  Pneumonia.  Obstructive sleep apnea.  Blockage of an artery in the lungs (pulmonary embolism, or PE).  Lung cancer.  Chronic lung disease.  Thyroid problems, especially if the thyroid is overactive (hyperthyroidism).  Caffeine.  Excessive alcohol use or illegal drug use.  Use of some medicines, including certain decongestants and diet pills. Sometimes, the cause cannot be found. What increases the risk? This condition is more likely to develop in:  People who are older in age.  People who smoke.  People who have diabetes mellitus.  People who are overweight (obese).  Athletes  who exercise vigorously. What are the signs or symptoms? Symptoms of this condition include:  A feeling that your heart is beating rapidly or irregularly.  A feeling of discomfort or pain in your chest.  Shortness of breath.  Sudden light-headedness or weakness.  Getting tired easily during exercise. In some cases, there are no symptoms. How is this diagnosed? Your health care provider may be able to detect atrial fibrillation when taking your pulse. If detected, this condition may be diagnosed with:  An electrocardiogram (ECG).  A Holter monitor test that records your heartbeat patterns over a 24-hour period.  Transthoracic echocardiogram (TTE) to evaluate how blood flows through your heart.  Transesophageal echocardiogram (TEE) to view more detailed images of your heart.  A stress test.  Imaging tests, such as a CT scan or chest X-ray.  Blood tests. How is this treated? The main goals of treatment are to prevent blood clots from forming and to keep your heart beating at a normal rate and rhythm. The type of treatment that you receive depends on many factors, such as your underlying medical conditions and how you feel when you are experiencing atrial fibrillation. This condition may be treated with:  Medicine to slow down the heart rate, bring the hearts rhythm back to normal, or prevent clots from forming.  Electrical cardioversion. This is a procedure that resets your hearts rhythm by delivering a controlled, low-energy shock to the heart through your skin.  Different types of ablation, such as catheter ablation, catheter ablation with pacemaker, or surgical ablation. These procedures destroy the heart tissues that send abnormal signals. When the pacemaker is used, it is placed under your skin to help your heart beat in a regular rhythm. Follow these instructions  at home:  Take over-the counter and prescription medicines only as told by your health care provider.  If  your health care provider prescribed a blood-thinning medicine (anticoagulant), take it exactly as told. Taking too much blood-thinning medicine can cause bleeding. If you do not take enough blood-thinning medicine, you will not have the protection that you need against stroke and other problems.  Do not use tobacco products, including cigarettes, chewing tobacco, and e-cigarettes. If you need help quitting, ask your health care provider.  If you have obstructive sleep apnea, manage your condition as told by your health care provider.  Do not drink alcohol.  Do not drink beverages that contain caffeine, such as coffee, soda, and tea.  Maintain a healthy weight. Do not use diet pills unless your health care provider approves. Diet pills may make heart problems worse.  Follow diet instructions as told by your health care provider.  Exercise regularly as told by your health care provider.  Keep all follow-up visits as told by your health care provider. This is important. How is this prevented?  Avoid drinking beverages that contain caffeine or alcohol.  Avoid certain medicines, especially medicines that are used for breathing problems.  Avoid certain herbs and herbal medicines, such as those that contain ephedra or ginseng.  Do not use illegal drugs, such as cocaine and amphetamines.  Do not smoke.  Manage your high blood pressure. Contact a health care provider if:  You notice a change in the rate, rhythm, or strength of your heartbeat.  You are taking an anticoagulant and you notice increased bruising.  You tire more easily when you exercise or exert yourself. Get help right away if:  You have chest pain, abdominal pain, sweating, or weakness.  You feel nauseous.  You notice blood in your vomit, bowel movement, or urine.  You have shortness of breath.  You suddenly have swollen feet and ankles.  You feel dizzy.  You have sudden weakness or numbness of the face, arm,  or leg, especially on one side of the body.  You have trouble speaking, trouble understanding, or both (aphasia).  Your face or your eyelid droops on one side. These symptoms may represent a serious problem that is an emergency. Do not wait to see if the symptoms will go away. Get medical help right away. Call your local emergency services (911 in the [place] Do not drive yourself to the hospital.  This information is not intended to replace advice given to you by your health care provider. Make sure you discuss any questions you have with your health care provider. Document Released: 10/25/2005 Document Revised: 03/03/2016 Document Reviewed: 02/19/2015 Elsevier Interactive Patient Education  2017 [employer] Information on my medicine - ELIQUIS (apixaban)  This medication education was reviewed with me or my healthcare representative as part of my discharge preparation.  The pharmacist that spoke with me during my hospital stay was:  Benny LennertMeyer, Andrew David, [facility]  Why was Eliquis prescribed for you? Eliquis was prescribed for you to reduce the risk of a blood clot forming that can cause a stroke if you have a medical condition called atrial fibrillation (a type of irregular heartbeat).  What do You need to know about Eliquis ? Take your Eliquis TWICE DAILY - one tablet in the morning and one tablet in the evening with or without food. If you have difficulty swallowing the tablet whole please discuss with your pharmacist how to take the medication safely.  Take Eliquis exactly as  prescribed by your doctor and DO NOT stop taking Eliquis without talking to the doctor who prescribed the medication.  Stopping may increase your risk of developing a stroke.  Refill your prescription before you run out.  After discharge, you should have regular check-up appointments with your healthcare provider that is prescribing your Eliquis.  In the future your dose may need to be changed if your kidney  function or weight changes by a significant amount or as you get older.  What do you do if you miss a dose? If you miss a dose, take it as soon as you remember on the same day and resume taking twice daily.  Do not take more than one dose of ELIQUIS at the same time to make up a missed dose.  Important Safety Information A possible side effect of Eliquis is bleeding. You should call your healthcare provider right away if you experience any of the following: ? Bleeding from an injury or your nose that does not stop. ? Unusual colored urine (red or dark brown) or unusual colored stools (red or black). ? Unusual bruising for unknown reasons. ? A serious fall or if you hit your head (even if there is no bleeding).  Some medicines may interact with Eliquis and might increase your risk of bleeding or clotting while on Eliquis. To help avoid this, consult your healthcare provider or pharmacist prior to using any new prescription or non-prescription medications, including herbals, vitamins, non-steroidal anti-inflammatory drugs (NSAIDs) and supplements.  This website has more information on Eliquis (apixaban): http://www.eliquis.com/eliquis/home

## 2016-10-04 ENCOUNTER — Inpatient Hospital Stay (HOSPITAL_COMMUNITY): Payer: BLUE CROSS/BLUE SHIELD

## 2016-10-04 ENCOUNTER — Encounter (HOSPITAL_COMMUNITY)
Admission: EM | Disposition: A | Discharge status: Home / Self Care | Payer: Self-pay | Source: Home / Self Care | Attending: Internal Medicine

## 2016-10-04 ENCOUNTER — Encounter (HOSPITAL_COMMUNITY): Payer: Self-pay

## 2016-10-04 ENCOUNTER — Inpatient Hospital Stay (HOSPITAL_COMMUNITY): Payer: BLUE CROSS/BLUE SHIELD | Admitting: Certified Registered Nurse Anesthetist

## 2016-10-04 DIAGNOSIS — I5041 Acute combined systolic (congestive) and diastolic (congestive) heart failure: Secondary | ICD-10-CM

## 2016-10-04 DIAGNOSIS — I051 Rheumatic mitral insufficiency: Secondary | ICD-10-CM

## 2016-10-04 DIAGNOSIS — I4891 Unspecified atrial fibrillation: Secondary | ICD-10-CM

## 2016-10-04 DIAGNOSIS — I340 Nonrheumatic mitral (valve) insufficiency: Secondary | ICD-10-CM

## 2016-10-04 HISTORY — PX: CARDIOVERSION: SHX1299

## 2016-10-04 HISTORY — PX: TEE WITHOUT CARDIOVERSION: SHX5443

## 2016-10-04 LAB — ECHO TEE
PISA EROA: 0.38 cm2
VTI: 110 cm

## 2016-10-04 SURGERY — ECHOCARDIOGRAM, TRANSESOPHAGEAL
Anesthesia: Monitor Anesthesia Care

## 2016-10-04 MED ORDER — SODIUM CHLORIDE 0.9 % IV SOLN
INTRAVENOUS | Status: DC
  Administered 2016-10-04: 11:00:00 via INTRAVENOUS

## 2016-10-04 MED ORDER — FUROSEMIDE 10 MG/ML IJ SOLN
40.0000 mg | Freq: Two times a day (BID) | INTRAMUSCULAR | Status: DC
  Administered 2016-10-04 – 2016-10-05 (×2): 40 mg via INTRAVENOUS
  Filled 2016-10-04 (×2): qty 4

## 2016-10-04 MED ORDER — ALPRAZOLAM 0.5 MG PO TABS
0.5000 mg | ORAL_TABLET | Freq: Two times a day (BID) | ORAL | Status: DC | PRN
  Administered 2016-10-04 – 2016-10-05 (×3): 0.5 mg via ORAL
  Filled 2016-10-04 (×3): qty 1

## 2016-10-04 MED ORDER — METOPROLOL TARTRATE 50 MG PO TABS
50.0000 mg | ORAL_TABLET | Freq: Two times a day (BID) | ORAL | Status: DC
  Administered 2016-10-04 – 2016-10-06 (×4): 50 mg via ORAL
  Filled 2016-10-04 (×4): qty 1

## 2016-10-04 MED ORDER — FUROSEMIDE 10 MG/ML IJ SOLN
40.0000 mg | Freq: Once | INTRAMUSCULAR | Status: AC
  Administered 2016-10-04: 40 mg via INTRAVENOUS
  Filled 2016-10-04: qty 4

## 2016-10-04 MED ORDER — LISINOPRIL 2.5 MG PO TABS
2.5000 mg | ORAL_TABLET | Freq: Every day | ORAL | Status: DC
  Administered 2016-10-04: 2.5 mg via ORAL
  Filled 2016-10-04 (×2): qty 1

## 2016-10-04 MED ORDER — OFF THE BEAT BOOK
Freq: Once | Status: AC
  Administered 2016-10-04: 09:00:00
  Filled 2016-10-04: qty 1

## 2016-10-04 MED ORDER — PROPOFOL 10 MG/ML IV BOLUS
INTRAVENOUS | Status: DC | PRN
  Administered 2016-10-04 (×3): 20 mg via INTRAVENOUS

## 2016-10-04 MED ORDER — SILVER SULFADIAZINE 1 % EX CREA
1.0000 "application " | TOPICAL_CREAM | Freq: Every day | CUTANEOUS | Status: DC
  Administered 2016-10-04 – 2016-10-05 (×2): 1 via TOPICAL
  Filled 2016-10-04: qty 85

## 2016-10-04 MED ORDER — FUROSEMIDE 10 MG/ML IJ SOLN: 40.0000 mg | Freq: Two times a day (BID) | INTRAMUSCULAR | Status: DC

## 2016-10-04 MED ORDER — BUTAMBEN-TETRACAINE-BENZOCAINE 2-2-14 % EX AERO
INHALATION_SPRAY | CUTANEOUS | Status: DC | PRN
  Administered 2016-10-04: 2 via TOPICAL

## 2016-10-04 MED ORDER — PROPOFOL 500 MG/50ML IV EMUL
INTRAVENOUS | Status: DC | PRN
  Administered 2016-10-04: 100 ug/kg/min via INTRAVENOUS

## 2016-10-04 NOTE — Anesthesia Preprocedure Evaluation (Signed)
Anesthesia Evaluation  Patient identified by MRN, date of birth, ID band Patient awake    Reviewed: Allergy & Precautions, NPO status   Airway Mallampati: II       Dental   Pulmonary Current Smoker,    breath sounds clear to auscultation       Cardiovascular  Rhythm:Irregular Rate:Normal  History noted. CE   Neuro/Psych    GI/Hepatic negative GI ROS, Neg liver ROS,   Endo/Other  negative endocrine ROS  Renal/GU negative Renal ROS     Musculoskeletal   Abdominal   Peds  Hematology   Anesthesia Other Findings   Reproductive/Obstetrics                             Anesthesia Physical Anesthesia Plan  ASA: III  Anesthesia Plan: MAC   Post-op Pain Management:    Induction: Intravenous  Airway Management Planned: Simple Face Mask  Additional Equipment:   Intra-op Plan:   Post-operative Plan:   Informed Consent: I have reviewed the patients History and Physical, chart, labs and discussed the procedure including the risks, benefits and alternatives for the proposed anesthesia with the patient or authorized representative who has indicated his/her understanding and acceptance.   Dental advisory given  Plan Discussed with: CRNA and Anesthesiologist  Anesthesia Plan Comments:         Anesthesia Quick Evaluation

## 2016-10-04 NOTE — Op Note (Addendum)
INDICATIONS: atrial fibrillation, mitral insufficiency  PROCEDURE:   Informed consent was obtained prior to the procedure. The risks, benefits and alternatives for the procedure were discussed and the patient comprehended these risks.  Risks include, but are not limited to, cough, sore throat, vomiting, nausea, somnolence, esophageal and stomach trauma or perforation, bleeding, low blood pressure, aspiration, pneumonia, infection, trauma to the teeth and death.    After a procedural time-out, the oropharynx was anesthetized with 20% benzocaine spray.   During this procedure the patient was administered propofol IV (Anesthesiology, Dr. Sampson Goon) for sedation.  The patient's heart rate, blood pressure, and oxygen saturationweare monitored continuously during the procedure.  The transesophageal probe was inserted in the esophagus and stomach without difficulty and multiple views were obtained.  The patient was kept under observation until the patient left the procedure room.  The patient left the procedure room in stable condition.   Agitated microbubble saline contrast was not administered.  COMPLICATIONS:    There were no immediate complications.  FINDINGS:  Severe mitral regurgitation, probably rheumatic. Dilated left atrium, no thrombus in the appendage. Moderately reduced LVEF, global hypokinesis. EF 35-40%. Normal aorta.  Incidentally noted extensive consolidation of the left lung and a small left pleural effusion.  RECOMMENDATIONS:   Proceed with cardioversion.  Time Spent Directly with the Patient:  30 minutes   [name] [name] 10/04/2016, 11:38 AM

## 2016-10-04 NOTE — Anesthesia Postprocedure Evaluation (Signed)
Anesthesia Post Note  Patient: [name]  Procedure(s) Performed: Procedure(s) (LRB): TRANSESOPHAGEAL ECHOCARDIOGRAM (TEE) (N/A) CARDIOVERSION (N/A)  Patient location during evaluation: Endoscopy Anesthesia Type: MAC Level of consciousness: awake Pain management: pain level controlled Vital Signs Assessment: post-procedure vital signs reviewed and stable Respiratory status: spontaneous breathing Cardiovascular status: stable Anesthetic complications: no    Last Vitals:  Vitals:   10/04/16 1240 10/04/16 1303  BP: (!) 100/54 (!) 114/58  Pulse: 86 90  Resp: (!) 26   Temp:      Last Pain:  Vitals:   10/04/16 1240  TempSrc:   PainSc: 3                  EDWARDS,Jamariya Davidoff

## 2016-10-04 NOTE — Op Note (Addendum)
Procedure: Electrical Cardioversion Indications:  Atrial Fibrillation  Procedure Details:  Consent: Risks of procedure as well as the alternatives and risks of each were explained to the (patient/caregiver).  Consent for procedure obtained.  Time Out: Verified patient identification, verified procedure, site/side was marked, verified correct patient position, special equipment/implants available, medications/allergies/relevent history reviewed, required imaging and test results available.  Performed  Patient placed on cardiac monitor, pulse oximetry, supplemental oxygen as necessary.  Sedation given: IV propofol, Anesthesiology Pacer pads placed anterior and posterior chest.  Cardioverted 3 time(s).  Cardioversion with synchronized biphasic 120J, 200J, 200J shock.  Evaluation: Findings: Post procedure EKG shows: Atrial Fibrillation Complications: None Patient did tolerate procedure well.   Unsuccessful DC cardioversion. Consider repeat attempt after improvement in CHF, lung problems and antiarrhythmic therapy. Discussed with Dr. [place].  Time Spent Directly with the Patient:  30 minutes   [name] [name] 10/04/2016, 11:41 AM

## 2016-10-04 NOTE — Progress Notes (Signed)
  Echocardiogram Echocardiogram Transesophageal has been performed.  Arvil Chaco 10/04/2016, 12:17 PM

## 2016-10-04 NOTE — Progress Notes (Signed)
Lasix IV given as ordered and patient requesting medication for Anxiety Xanax given as ordered as needed for anxiety will monitor patient. Savana Spina, Randall An RN

## 2016-10-04 NOTE — Progress Notes (Signed)
Patient family very upset this AM about waiting for testing for patient, about patient not being able to sleep any, several things. Suzzette Righter [facility] Made aware and into see patient and family. Orders were received. Unit Assistant Director also made aware and into see patient. Will continue to monitor patient. Mattis Featherly, Randall An RN

## 2016-10-04 NOTE — Transfer of Care (Signed)
Immediate Anesthesia Transfer of Care Note  Patient: [name] [name]  Procedure(s) Performed: Procedure(s): TRANSESOPHAGEAL ECHOCARDIOGRAM (TEE) (N/A) CARDIOVERSION (N/A)  Patient Location: PACU  Anesthesia Type:MAC  Level of Consciousness: awake, alert  and oriented  Airway & Oxygen Therapy: Patient Spontanous Breathing and Patient connected to nasal cannula oxygen  Post-op Assessment: Report given to RN and Post -op Vital signs reviewed and stable  Post vital signs: Reviewed and stable  Last Vitals:  Vitals:   10/04/16 0951 10/04/16 1003  BP: 108/68 111/67  Pulse: 94 90  Resp:  20  Temp:  36.4 C    Last Pain:  Vitals:   10/04/16 1003  TempSrc: Oral  PainSc:          Complications: No apparent anesthesia complications

## 2016-10-04 NOTE — Progress Notes (Signed)
Off the beat book given to patient. [name] [name], Randall An RN

## 2016-10-04 NOTE — Progress Notes (Signed)
Patient Name: [name] [name] Date of Encounter: 10/04/2016  Primary Cardiologist: New, Seen by Dr. Graciela Husbands and [facility] over the weekend  Hospital Problem List     Active Problems:   Atrial fibrillation (HCC)     Subjective   Uncomfortable, says he cannot breath. Endorses orthopnea.   Inpatient Medications    Scheduled Meds: . apixaban  5 mg Oral BID  . furosemide  40 mg Intravenous Once   Continuous Infusions: . diltiazem (CARDIZEM) infusion 7.5 mg/hr (10/03/16 1950)   PRN Meds: acetaminophen, ALPRAZolam, guaiFENesin-dextromethorphan, ondansetron (ZOFRAN) IV   Vital Signs    Vitals:   10/03/16 1439 10/03/16 1637 10/03/16 2038 10/04/16 0538  BP: 108/70 118/69 (!) 119/58 102/81  Pulse:  89 84 94  Resp:  18 20 20   Temp:  97.3 F (36.3 C) 97.3 F (36.3 C) 97.4 F (36.3 C)  TempSrc:  Oral Oral Oral  SpO2: 95% 93% 91% 94%  Weight:      Height:        Intake/Output Summary (Last 24 hours) at 10/04/16 0807 Last data filed at 10/03/16 1700  Gross per 24 hour  Intake              690 ml  Output              460 ml  Net              230 ml   Filed Weights   10/02/16 1127  Weight: 171 lb (77.6 kg)    Physical Exam   GEN: Well nourished, well developed, in no acute distress.  HEENT: Grossly normal.  Neck: Supple, no JVD, carotid bruits, or masses. Cardiac: irregularly irregular, 2/6 systolic murmur. No rubs or gallops. No clubbing, cyanosis, edema.  Radials/DP/PT 2+ and equal bilaterally.  Respiratory:  Respirations regular and unlabored, clear to auscultation bilaterally. GI: Soft, nontender, nondistended, BS + x 4. MS: no deformity or atrophy. Skin: warm and dry, no rash. Neuro:  Strength and sensation are intact. Psych: AAOx3.  Normal affect.  Labs    CBC  Recent Labs  10/02/16 1100  WBC 9.3  HGB 13.4  HCT 40.7  MCV 92.5  PLT 218   Basic Metabolic Panel  Recent Labs  10/02/16 1100 10/03/16 0215  NA 138 140  K 4.0 4.3  CL 110 105    CO2 20* 26  GLUCOSE 143* 109*  BUN 16 13  CREATININE 0.98 1.10  CALCIUM 8.8* 8.9   Thyroid Function Tests  Recent Labs  10/02/16 1100  TSH 1.303    Telemetry    Afib, rates controlled in the 80-90's - Personally Reviewed  ECG     Afib RVR rate 149- Personally Reviewed  Radiology    No results found.  Cardiac Studies  Transthoracic Echocardiography 10/03/16 Study Conclusions  - Left ventricle: The cavity size was at the upper limits of   normal. Wall thickness was increased in a pattern of mild LVH.   Systolic function was mildly to moderately reduced. The estimated   ejection fraction was in the range of 40% to 45%. Diffuse   hypokinesis. The study is not technically sufficient to allow   evaluation of LV diastolic function. - Aortic valve: Mildly calcified annulus. Trileaflet; mildly   calcified leaflets. - Mitral valve: Calcified annulus. Appears to be somewhat   restricted posterior leaflet motion with incomplete coaptation,   possibly a component of annular dilatation. There was moderate to   severe regurgitation directed  posteriorly. - Left atrium: The atrium was severely dilated. - Right ventricle: Systolic function was mildly reduced. - Right atrium: The atrium was severely dilated. Central venous   pressure (est): 8 mm Hg. - Tricuspid valve: There was mild regurgitation. - Pulmonary arteries: PA peak pressure: 44 mm Hg (S). - Pericardium, extracardiac: There was no pericardial effusion.  Impressions:  - Upper normal LV chamber size with mild LVH and LVEF approximately   40-45%. There is relatively diffuse hypokinesis. Indeterminate   diastolic function in the setting of atrial fibrillation. Severe   left atrial enlargement. Mildly calcified mitral annulus. Appears   to be somewhat restricted posterior leaflet motion with   incomplete coaptation, possibly a component of annular   dilatation. There is moderate to severe, posteriorly directed    mitral regurgitation (PISA not performed). Mildly reduced right   ventricular contraction. Severe right atrial enlargement. Mild   tricuspid regurgitation with PASP estimated at 44 mmHg. Could   consider TEE for further evaluation of mitral regurgitation.   Patient Profile     [name] [name] is a 60 year old male with no prior cardiac history. Admitted on 10/02/16 with rapid Afib and dyspnea. For TEE/DCCV today. Started on Eliquis for anticoagulation.    Assessment & Plan    1. New onset atrial fibrillation: Presented with a month of malaise and flu like symptoms. Developed activity intolerance and orthopnea, found to be in Afib RVR. Started on diltiazem gtt for rate control and Eliquis for anticoagulation, 3 doses so far, 4th dose will be given prior to TEE/DCCV today.   Echo shows reduced EF of 40-45%, and severe left atrial enlargement. Would discontinue diltiazem for rate control and use beta blocker in setting of reduced EF. With his severe left atrial enlargement I worry that he may not hold NSR. His wife thinks that he has sleep apnea. He needs a sleep study.   2. Acute on chronic systolic CHF: Appears volume overloaded today, using accessory muscles to breathe, will give 40mg  IV lasix now. With reduced EF would start on ACE-I, BP soft start with lisinopril 2.5mg . Would also start Coreg likely tomorrow.     Signed, Little IshikawaErin E Smith, NP  10/04/2016, 8:07 AM   Patient seen and examined and history reviewed. Agree with above findings and plan. Pleasant 60 yo WM admitted this weekend with symptoms of progressive dyspnea and fatigue. Found to be in Afib with RVR. Patient reports a longstanding murmur but has never had this formally evaluated by Echo. Presented with 6-7 weeks of progressive dyspnea unresponsive to antibiotics and mucinex.   I personally reviewed results of Echo and TEE.  He has Severe MR. Severe biatrial enlargement. Moderate LV dysfunction with EF 40-45%. Attempt at DCCV  today unsuccessful.  On exam he has bilateral rales 1/2 way up bilaterally. + JVD. Gr 2-3/6 systolic murmur at the apex. No edema.  My impression is that he has longstanding MR that has led to significant atrial enlargement and subsequent AFib. DCCV unsuccessful due to marked atrial enlargement. EF is down due to MR and/or Afib with RVR.  We need to promote better diuresis with IV lasix. HR currently controlled on IV diltiazem but with LV dysfunction will try and transition to oral metoprolol. Continue anticoagulation.  At some point we will need to consider right and left heart cath. I suspect he may need MV repair or replacement. Timing of this will depend on his response to medical therapy.  Prolonged discussion of these issues with  patient and extended family.  Ziah Turvey [place], MDFACC 10/04/2016 2:13 PM

## 2016-10-05 MED ORDER — FUROSEMIDE 40 MG PO TABS: 40.0000 mg | ORAL_TABLET | Freq: Every day | ORAL | Status: DC

## 2016-10-05 NOTE — Progress Notes (Signed)
Patient Name: [name] KocherJohn P [name] Date of Encounter: 10/05/2016  Primary Cardiologist: [name] Zingale [place] MD  Hospital Problem List     Active Problems:   Atrial fibrillation with rapid ventricular response [facility](HCC)   Rheumatic mitral regurgitation     Subjective   Feels much better. No SOB. Denies chest pain or dizziness.  Inpatient Medications    Scheduled Meds: . apixaban  5 mg Oral BID  . furosemide  40 mg Intravenous Q12H  . metoprolol tartrate  50 mg Oral BID  . silver sulfADIAZINE  1 application Topical Daily   Continuous Infusions:  PRN Meds: acetaminophen, ALPRAZolam, guaiFENesin-dextromethorphan, ondansetron (ZOFRAN) IV   Vital Signs    Vitals:   10/04/16 2000 10/04/16 2057 10/05/16 0434 10/05/16 0946  BP:  (!) 106/54 97/62 (!) 91/52  Pulse:  77 82 80  Resp:  20 20   Temp:  98.2 F (36.8 C) 97.9 F (36.6 C)   TempSrc:  Oral Oral   SpO2: 95% 93% 96%   Weight:   157 lb 6.4 oz (71.4 kg)   Height:        Intake/Output Summary (Last 24 hours) at 10/05/16 1120 Last data filed at 10/05/16 0900  Gross per 24 hour  Intake              490 ml  Output             2250 ml  Net            -1760 ml   Filed Weights   10/02/16 1127 10/05/16 0434  Weight: 171 lb (77.6 kg) 157 lb 6.4 oz (71.4 kg)    Physical Exam    GEN: Well nourished, well developed, in no acute distress.  HEENT: Grossly normal.  Neck: Supple, no JVD, carotid bruits, or masses. Cardiac: IRRR, no rubs, or gallops. Gr 2-3/6 systolic murmur at the apex. No clubbing, cyanosis, edema.  Radials/DP/PT 2+ and equal bilaterally.  Respiratory:  Respirations regular and unlabored, clear to auscultation bilaterally. GI: Soft, nontender, nondistended, BS + x 4. MS: no deformity or atrophy. Skin: warm and dry, no rash. Neuro:  Strength and sensation are intact. Psych: AAOx3.  Normal affect.  Labs    CBC No results for input(s): WBC, NEUTROABS, HGB, HCT, MCV, PLT in the last 72 hours. Basic Metabolic  Panel  Recent Labs  40/98/1111/26/17 0215  NA 140  K 4.3  CL 105  CO2 26  GLUCOSE 109*  BUN 13  CREATININE 1.10  CALCIUM 8.9   Liver Function Tests No results for input(s): AST, ALT, ALKPHOS, BILITOT, PROT, ALBUMIN in the last 72 hours. No results for input(s): LIPASE, AMYLASE in the last 72 hours. Cardiac Enzymes No results for input(s): CKTOTAL, CKMB, CKMBINDEX, TROPONINI in the last 72 hours. BNP Invalid input(s): POCBNP D-Dimer No results for input(s): DDIMER in the last 72 hours. Hemoglobin A1C No results for input(s): HGBA1C in the last 72 hours. Fasting Lipid Panel No results for input(s): CHOL, HDL, LDLCALC, TRIG, CHOLHDL, LDLDIRECT in the last 72 hours. Thyroid Function Tests No results for input(s): TSH, T4TOTAL, T3FREE, THYROIDAB in the last 72 hours.  Invalid input(s): FREET3  Telemetry    Afib with controlled rate in 80s. - Personally Reviewed  ECG    Afib with RVR - Personally Reviewed  Radiology    Dg Chest Port 1 View  Result Date: 10/04/2016 CLINICAL DATA:  Irregular heartbeat.  Hypoxia. EXAM: PORTABLE CHEST 1 VIEW COMPARISON:  None. FINDINGS: There are  bibasilar densities suggestive for pleural effusions and airspace disease. There may be airspace disease in the right hilum and right lower lung region. Heart size appears to be upper limits of normal. The trachea is midline. There is lucency at the left lung apex which may represent a bleb. IMPRESSION: Asymmetric airspace disease with bilateral pleural effusions. Findings could represent pulmonary edema and/or infection. Lucency at the left lung apex probably represents a bleb rather than pneumothorax. Electronically Signed   By: Richarda Overlie M.D.   On: 10/04/2016 12:48    Cardiac Studies   Transthoracic Echocardiography 10/03/16 Study Conclusions  - Left ventricle: The cavity size was at the upper limits of normal. Wall thickness was increased in a pattern of mild LVH. Systolic function was mildly  to moderately reduced. The estimated ejection fraction was in the range of 40% to 45%. Diffuse hypokinesis. The study is not technically sufficient to allow evaluation of LV diastolic function. - Aortic valve: Mildly calcified annulus. Trileaflet; mildly calcified leaflets. - Mitral valve: Calcified annulus. Appears to be somewhat restricted posterior leaflet motion with incomplete coaptation, possibly a component of annular dilatation. There was moderate to severe regurgitation directed posteriorly. - Left atrium: The atrium was severely dilated. - Right ventricle: Systolic function was mildly reduced. - Right atrium: The atrium was severely dilated. Central venous pressure (est): 8 mm Hg. - Tricuspid valve: There was mild regurgitation. - Pulmonary arteries: PA peak pressure: 44 mm Hg (S). - Pericardium, extracardiac: There was no pericardial effusion.  Impressions:  - Upper normal LV chamber size with mild LVH and LVEF approximately 40-45%. There is relatively diffuse hypokinesis. Indeterminate diastolic function in the setting of atrial fibrillation. Severe left atrial enlargement. Mildly calcified mitral annulus. Appears to be somewhat restricted posterior leaflet motion with incomplete coaptation, possibly a component of annular dilatation. There is moderate to severe, posteriorly directed mitral regurgitation (PISA not performed). Mildly reduced right ventricular contraction. Severe right atrial enlargement. Mild tricuspid regurgitation with PASP estimated at 44 mmHg. Could consider TEE for further evaluation of mitral regurgitation.  TEE: FINDINGS:  Severe mitral regurgitation, probably rheumatic. Dilated left atrium, no thrombus in the appendage. Moderately reduced LVEF, global hypokinesis. EF 35-40%. Normal aorta.  Incidentally noted extensive consolidation of the left lung and a small left pleural effusion.  Patient Profile      [name] [name] is a 60 year old male with no prior cardiac history. Admitted on 10/02/16 with rapid Afib and dyspnea. For TEE/DCCV today. Started on Eliquis for anticoagulation.   Assessment & Plan    1. Atrial fibrillation duration unknown. HR now well controlled. Unsuccessful DCCV suggests longer duration of AFib. Marked biatrial enlargement. Plan initial therapy with rate control and anticoagulation with Eliquis. Will stop IV diltiazem. 2. Acute on chronic combined systolic/diastolic CHF. EF 40-45%. This is particularly worrisome with severe MR. Indicates inability of LV to accommodate volume overload. On metoprolol and IV lasix. Stop diltiazem. Will hold ACEi due to low BP. Switch lasix to po. I/0 negative 2.8 liters. Improved rales and symptoms.  3. Chronic severe MR. ? Rheumatic. Patient reports being ill as a child but can't remember specifics.   Plan: anticipate that he will require MV repair versus replacement. Will optimize medical therapy. If stable will plan DC tomorrow. Will need to arrange right and left heart cath as outpatient.   Signed, Kairee Kozma [place], MD  10/05/2016, 11:20 AM

## 2016-10-06 ENCOUNTER — Telehealth: Payer: Self-pay | Admitting: Cardiology

## 2016-10-06 DIAGNOSIS — I5021 Acute systolic (congestive) heart failure: Secondary | ICD-10-CM

## 2016-10-06 LAB — BASIC METABOLIC PANEL
Anion gap: 8 (ref 5–15)
BUN: 13 mg/dL (ref 6–20)
CALCIUM: 9.1 mg/dL (ref 8.9–10.3)
CO2: 26 mmol/L (ref 22–32)
CREATININE: 0.86 mg/dL (ref 0.61–1.24)
Chloride: 107 mmol/L (ref 101–111)
GFR calc Af Amer: >60 mL/min (ref >60)
GLUCOSE: 106 mg/dL — AB (ref 65–99)
POTASSIUM: 3.9 mmol/L (ref 3.5–5.1)
Sodium: 141 mmol/L (ref 135–145)

## 2016-10-06 MED ORDER — SILVER SULFADIAZINE 1 % EX CREA: 1.0000 "application " | TOPICAL_CREAM | Freq: Every day | CUTANEOUS | 0 refills | Status: DC

## 2016-10-06 MED ORDER — FUROSEMIDE 20 MG PO TABS: 20.0000 mg | ORAL_TABLET | Freq: Every day | ORAL | 12 refills | Status: DC

## 2016-10-06 MED ORDER — APIXABAN 5 MG PO TABS: 5.0000 mg | ORAL_TABLET | Freq: Two times a day (BID) | ORAL | 12 refills | Status: DC

## 2016-10-06 MED ORDER — FUROSEMIDE 20 MG PO TABS
20.0000 mg | ORAL_TABLET | Freq: Every day | ORAL | Status: DC
  Administered 2016-10-06: 20 mg via ORAL
  Filled 2016-10-06: qty 1

## 2016-10-06 MED ORDER — METOPROLOL TARTRATE 50 MG PO TABS: 50.0000 mg | ORAL_TABLET | Freq: Two times a day (BID) | ORAL | 12 refills | Status: DC

## 2016-10-06 NOTE — Progress Notes (Signed)
Insurance check completed for Eliquis S/W [name]  @ [employer] # [phone]   ELIQUIS 5 MG BID 30 /60 TAB   COVER- YES  CO-PAY- $ 35.00  TIER- 2 DRUG  PRIOR APPROVAL- NO  [facility] : [facility]

## 2016-10-06 NOTE — Discharge Summary (Signed)
Discharge Summary    Patient ID: [name] [name],  MRN: [id number], DOB/AGE: 01/14/1956 60 y.o.  Admit date: 10/02/2016 Discharge date: 10/06/2016  Primary Care Provider: [facility] Primary Cardiologist: Dr. [place]   Discharge Diagnoses    Active Problems:   Atrial fibrillation with rapid ventricular response [facility])   Rheumatic mitral regurgitation   Allergies No Known Allergies  Diagnostic Studies/Procedures    Transthoracic Echocardiography 10/03/16 Study Conclusions  - Left ventricle: The cavity size was at the upper limits of   normal. Wall thickness was increased in a pattern of mild LVH.   Systolic function was mildly to moderately reduced. The estimated   ejection fraction was in the range of 40% to 45%. Diffuse   hypokinesis. The study is not technically sufficient to allow   evaluation of LV diastolic function. - Aortic valve: Mildly calcified annulus. Trileaflet; mildly   calcified leaflets. - Mitral valve: Calcified annulus. Appears to be somewhat   restricted posterior leaflet motion with incomplete coaptation,   possibly a component of annular dilatation. There was moderate to   severe regurgitation directed posteriorly. - Left atrium: The atrium was severely dilated. - Right ventricle: Systolic function was mildly reduced. - Right atrium: The atrium was severely dilated. Central venous   pressure (est): 8 mm Hg. - Tricuspid valve: There was mild regurgitation. - Pulmonary arteries: PA peak pressure: 44 mm Hg (S). - Pericardium, extracardiac: There was no pericardial effusion.  Impressions:  - Upper normal LV chamber size with mild LVH and LVEF approximately   40-45%. There is relatively diffuse hypokinesis. Indeterminate   diastolic function in the setting of atrial fibrillation. Severe   left atrial enlargement. Mildly calcified mitral annulus. Appears   to be somewhat restricted posterior leaflet motion with  incomplete coaptation, possibly a component of annular   dilatation. There is moderate to severe, posteriorly directed   mitral regurgitation (PISA not performed). Mildly reduced right   ventricular contraction. Severe right atrial enlargement. Mild   tricuspid regurgitation with PASP estimated at 44 mmHg. Could   consider TEE for further evaluation of mitral regurgitation.  Transesophageal Echocardiography 10/04/16 Study Conclusions  - Left ventricle: The cavity size was normal. Wall thickness was   normal. Systolic function was mildly to moderately reduced. The   estimated ejection fraction was in the range of 40% to 45%. Mild   diffuse hypokinesis with no identifiable regional variations. - Aortic valve: No evidence of vegetation. - Mitral valve: Moderate thickening, involving the leaflet margin   more than the base, with mild involvement of chords, consistent   with rheumatic disease. There was moderate to severe   regurgitation originating across the line of coaptation, but most   importantly from the anterolateral commissure and directed   centrally. - Left atrium: The atrium was severely dilated. No evidence of   thrombus in the atrial cavity or appendage. No spontaneous echo   contrast was observed. Emptying velocity was reduced. - Right atrium: The atrium was severely dilated. - Atrial septum: No defect or patent foramen ovale was identified. - Tricuspid valve: No evidence of vegetation. - Pulmonic valve: No evidence of vegetation. - Pericardium, extracardiac: There was a left pleural effusion.   Incidental note is made of left lung consolidation.    _____________   History of Present Illness     [name] [name] is a 60 year old male with no prior cardiac history. He presented to the ED on 10/02/16 with a  2 week history of flu-like illness characterized by fever, chills, myalgias and cough. He was treated initially with antibiotics that improved his symptoms but he  became more dypneic and cough persisted.   Also developed orthopnea and PND which prompted him to seek medical attention. He was found to be in atrial fibrillation with RVR and admitted.   Hospital Course  He was started on apixoban with plans for TEE/DCCV after appropriate doses of apixoban. Diltiazem gtt was started for rate control.   Also was started on IV diuresis as chest x ray showed pulmonary edema and patient was volume overloaded on exam. He had an excellent response to IV diuresis and his weight was down 13 pounds at discharge. Discharge weight was 158 lbs.   Transthoracic echo showed an EF of 40-45%, severe mitral regurgitation, severely dilated left atrium. TEE/DCCV on 10/04/16 was unsuccessful. In light of his reduced EF, his diltiazem was discontinued and he was started on metoprolol for rate control.   It was felt that his long standing MR led to significant atrial enlargement and subsequent Afib. He will need right and left heart cath which is scheduled for 12/13 at 10am.   His heart rate is controlled on metoprolol. He will follow up in our office in one week with repeat labs. If his renal function is stable and his BP can tolerate, will need to start on ACE-I. Will continue his po Lasix at 20mg .   He was seen today by Dr. [place] and deemed suitable for discharge.   _____________  Discharge Vitals Blood pressure 106/77, pulse (!) 115, temperature 97.4 F (36.3 C), temperature source Oral, resp. rate 18, height 5\' 10"  (1.778 m), weight 158 lb 9.6 oz (71.9 kg), SpO2 97 %.  Filed Weights   10/02/16 1127 10/05/16 0434 10/06/16 0531  Weight: 171 lb (77.6 kg) 157 lb 6.4 oz (71.4 kg) 158 lb 9.6 oz (71.9 kg)    Labs & Radiologic Studies    Basic Metabolic Panel  Recent Labs  10/06/16 0952  NA 141  K 3.9  CL 107  CO2 26  GLUCOSE 106*  BUN 13  CREATININE 0.86  CALCIUM 9.1    Dg Chest Port 1 View  Result Date: 10/04/2016 CLINICAL DATA:  Irregular heartbeat.   Hypoxia. EXAM: PORTABLE CHEST 1 VIEW COMPARISON:  None. FINDINGS: There are bibasilar densities suggestive for pleural effusions and airspace disease. There may be airspace disease in the right hilum and right lower lung region. Heart size appears to be upper limits of normal. The trachea is midline. There is lucency at the left lung apex which may represent a bleb. IMPRESSION: Asymmetric airspace disease with bilateral pleural effusions. Findings could represent pulmonary edema and/or infection. Lucency at the left lung apex probably represents a bleb rather than pneumothorax. Electronically Signed   By: Richarda OverlieAdam  Henn M.D.   On: 10/04/2016 12:48    Disposition   Pt is being discharged home today in good condition.  Follow-up Plans & Appointments    Follow-up Information    Corine ShelterLuke Kilroy, [place] Follow up on 10/15/2016.   Specialties:  Cardiology, Radiology Why:  at 8:30am for hospital follow up Contact information: [address] [place] [address] [phone]        Peter [place]Jordan, MD Follow up on 11/25/2016.   Specialty:  Cardiology Why:  at 9am for appt. with Dr. [place].  Contact information: [address] [place] [address] [phone]  [facility] Follow up on 10/20/2016.   Why:  Come to the Admissions department at 8am for heart catheterization. Nothing to eat or drink after midnight on 10/19/16.  Contact information: [address] [place] [address] [phone]         Discharge Instructions    Diet - low sodium heart healthy    Complete by:  As directed    Discharge instructions    Complete by:  As directed    You have a heart catheterization scheduled for 10/20/16. Please come to [facility] hospital at 8am that day and report to the admissions office. Nothing to eat or drink after midnight the night before.   Increase activity slowly    Complete by:  As directed       Discharge  Medications   Current Discharge Medication List    START taking these medications   Details  apixaban (ELIQUIS) 5 MG TABS tablet Take 1 tablet (5 mg total) by mouth 2 (two) times daily. Qty: 60 tablet, Refills: 12    furosemide (LASIX) 20 MG tablet Take 1 tablet (20 mg total) by mouth daily. Qty: 30 tablet, Refills: 12    metoprolol (LOPRESSOR) 50 MG tablet Take 1 tablet (50 mg total) by mouth 2 (two) times daily. Qty: 60 tablet, Refills: 12    silver sulfADIAZINE (SILVADENE) 1 % cream Apply 1 application topically daily. Qty: 50 g, Refills: 0      CONTINUE these medications which have NOT CHANGED   Details  Multiple Vitamin (MULTIVITAMIN) tablet Take 1 tablet by mouth daily.      STOP taking these medications     aspirin 325 MG tablet      ibuprofen (ADVIL,MOTRIN) 200 MG tablet             Outstanding Labs/Studies   BMP   Duration of Discharge Encounter   Greater than 30 minutes including physician time.  Signed, Little Ishikawa NP 10/06/2016, 11:11 AM

## 2016-10-06 NOTE — Telephone Encounter (Signed)
TCM Phone call .Marland Kitchen Appt is on 10/15/16 at 8:30am w/ Corine Shelter . Thanks

## 2016-10-06 NOTE — Care Management Note (Signed)
Case Management Note Donn Pierini RN, BSN Unit 2W-Case Manager [phone]  Patient Details  Name: [name] [name] MRN: [id number] Date of Birth: [birth year]  Subjective/Objective:  Pt admitted with afib- cardioversion attempted x3                  Action/Plan: PTA pt lived at home with wife- plan to return home- pt has been started on Eliquis- per insurance check- S/W [name]  @ [employer] # [phone]   ELIQUIS 5 MG BID 30 /60 TAB   COVER- YES  CO-PAY- $ 35.00  TIER- 2 DRUG  PRIOR APPROVAL- NO  [facility]   Spoke with pt and wife at bedside- pt given 30 day free card along with copay assist card for Eliquis, [facility]   Expected Discharge Date:       10/06/16           Expected Discharge Plan:  Home/Self Care  In-House Referral:     Discharge planning Services  CM Consult, Medication Assistance  Post Acute Care Choice:  NA Choice offered to:     DME Arranged:    DME Agency:     HH Arranged:    HH Agency:     Status of Service:  Completed, signed off  If discussed at Long Length of Stay Meetings, dates discussed:    Discharge Disposition: Home/self care   Additional Comments:  Darrold Span, RN 10/06/2016, 12:10 PM

## 2016-10-06 NOTE — Progress Notes (Signed)
Patient Name: [name] KocherJohn P [name] Date of Encounter: 10/06/2016  Primary Cardiologist: Willard Farquharson [place] MD  Hospital Problem List     Active Problems:   Atrial fibrillation with rapid ventricular response [facility](HCC)   Rheumatic mitral regurgitation     Subjective   Feels much better. No SOB. Denies chest pain or dizziness.  Inpatient Medications    Scheduled Meds: . apixaban  5 mg Oral BID  . furosemide  40 mg Oral Daily  . metoprolol tartrate  50 mg Oral BID  . silver sulfADIAZINE  1 application Topical Daily   Continuous Infusions:  PRN Meds: acetaminophen, ALPRAZolam, guaiFENesin-dextromethorphan, ondansetron (ZOFRAN) IV   Vital Signs    Vitals:   10/05/16 0946 10/05/16 1426 10/05/16 2056 10/06/16 0531  BP: (!) 91/52 (!) 107/47 (!) 109/56 106/77  Pulse: 80 72 96 86  Resp:  18 18 18   Temp:  97.8 F (36.6 C) 98.2 F (36.8 C) 97.4 F (36.3 C)  TempSrc:  Oral Oral Oral  SpO2:  98% 93% 97%  Weight:    158 lb 9.6 oz (71.9 kg)  Height:        Intake/Output Summary (Last 24 hours) at 10/06/16 0929 Last data filed at 10/06/16 0828  Gross per 24 hour  Intake              600 ml  Output             1200 ml  Net             -600 ml   Filed Weights   10/02/16 1127 10/05/16 0434 10/06/16 0531  Weight: 171 lb (77.6 kg) 157 lb 6.4 oz (71.4 kg) 158 lb 9.6 oz (71.9 kg)    Physical Exam    GEN: Well nourished, well developed, in no acute distress.  HEENT: Grossly normal.  Neck: Supple, no JVD, carotid bruits, or masses. Cardiac: IRRR, no rubs, or gallops. Gr 2-3/6 systolic murmur at the apex. No clubbing, cyanosis, edema.  Radials/DP/PT 2+ and equal bilaterally.  Respiratory:  Respirations regular and unlabored, clear to auscultation bilaterally. GI: Soft, nontender, nondistended, BS + x 4. MS: no deformity or atrophy. Skin: warm and dry, no rash. Neuro:  Strength and sensation are intact. Psych: AAOx3.  Normal affect.  Labs    CBC No results for input(s): WBC,  NEUTROABS, HGB, HCT, MCV, PLT in the last 72 hours. Basic Metabolic Panel No results for input(s): NA, K, CL, CO2, GLUCOSE, BUN, CREATININE, CALCIUM, MG, PHOS in the last 72 hours. Liver Function Tests No results for input(s): AST, ALT, ALKPHOS, BILITOT, PROT, ALBUMIN in the last 72 hours. No results for input(s): LIPASE, AMYLASE in the last 72 hours. Cardiac Enzymes No results for input(s): CKTOTAL, CKMB, CKMBINDEX, TROPONINI in the last 72 hours. BNP Invalid input(s): POCBNP D-Dimer No results for input(s): DDIMER in the last 72 hours. Hemoglobin A1C No results for input(s): HGBA1C in the last 72 hours. Fasting Lipid Panel No results for input(s): CHOL, HDL, LDLCALC, TRIG, CHOLHDL, LDLDIRECT in the last 72 hours. Thyroid Function Tests No results for input(s): TSH, T4TOTAL, T3FREE, THYROIDAB in the last 72 hours.  Invalid input(s): FREET3  Telemetry    Afib with controlled rate in 80s. - Personally Reviewed  ECG    Afib with RVR - Personally Reviewed  Radiology    Dg Chest Port 1 View  Result Date: 10/04/2016 CLINICAL DATA:  Irregular heartbeat.  Hypoxia. EXAM: PORTABLE CHEST 1 VIEW COMPARISON:  None. FINDINGS: There are  bibasilar densities suggestive for pleural effusions and airspace disease. There may be airspace disease in the right hilum and right lower lung region. Heart size appears to be upper limits of normal. The trachea is midline. There is lucency at the left lung apex which may represent a bleb. IMPRESSION: Asymmetric airspace disease with bilateral pleural effusions. Findings could represent pulmonary edema and/or infection. Lucency at the left lung apex probably represents a bleb rather than pneumothorax. Electronically Signed   By: Richarda Overlie M.D.   On: 10/04/2016 12:48    Cardiac Studies   Transthoracic Echocardiography 10/03/16 Study Conclusions  - Left ventricle: The cavity size was at the upper limits of normal. Wall thickness was increased in a  pattern of mild LVH. Systolic function was mildly to moderately reduced. The estimated ejection fraction was in the range of 40% to 45%. Diffuse hypokinesis. The study is not technically sufficient to allow evaluation of LV diastolic function. - Aortic valve: Mildly calcified annulus. Trileaflet; mildly calcified leaflets. - Mitral valve: Calcified annulus. Appears to be somewhat restricted posterior leaflet motion with incomplete coaptation, possibly a component of annular dilatation. There was moderate to severe regurgitation directed posteriorly. - Left atrium: The atrium was severely dilated. - Right ventricle: Systolic function was mildly reduced. - Right atrium: The atrium was severely dilated. Central venous pressure (est): 8 mm Hg. - Tricuspid valve: There was mild regurgitation. - Pulmonary arteries: PA peak pressure: 44 mm Hg (S). - Pericardium, extracardiac: There was no pericardial effusion.  Impressions:  - Upper normal LV chamber size with mild LVH and LVEF approximately 40-45%. There is relatively diffuse hypokinesis. Indeterminate diastolic function in the setting of atrial fibrillation. Severe left atrial enlargement. Mildly calcified mitral annulus. Appears to be somewhat restricted posterior leaflet motion with incomplete coaptation, possibly a component of annular dilatation. There is moderate to severe, posteriorly directed mitral regurgitation (PISA not performed). Mildly reduced right ventricular contraction. Severe right atrial enlargement. Mild tricuspid regurgitation with PASP estimated at 44 mmHg. Could consider TEE for further evaluation of mitral regurgitation.  TEE: FINDINGS:  Severe mitral regurgitation, probably rheumatic. Dilated left atrium, no thrombus in the appendage. Moderately reduced LVEF, global hypokinesis. EF 35-40%. Normal aorta.  Incidentally noted extensive consolidation of the left lung and  a small left pleural effusion.  Patient Profile     [name] [name] is a 60 year old male with no prior cardiac history. Admitted on 10/02/16 with rapid Afib and dyspnea. For TEE/DCCV today. Started on Eliquis for anticoagulation.   Assessment & Plan    1. Atrial fibrillation duration unknown. HR now well controlled on oral metoprolol. Unsuccessful DCCV suggests longer duration of AFib. Marked biatrial enlargement. Plan initial therapy with rate control and anticoagulation with Eliquis.  2. Acute on chronic combined systolic/diastolic CHF. EF 40-45%. This is particularly worrisome with severe MR. Indicates inability of LV to accommodate volume overload. On metoprolol and po lasix.  Will hold ACEi due to low BP. Reduce lasix to 20 mg daily. I/0 negative 5.4 liters. Will check BMET today.  3. Chronic severe MR. ? Rheumatic. Patient reports being ill as a child but can't remember specifics.   Plan: anticipate that he will require MV repair versus replacement. Will optimize medical therapy. DC home today. Plan follow up in our office in one week with repeat labs. If renal function stable will add ACEi. Will need to arrange right and left heart cath as outpatient probably on Dec 13 or 14 when I am in lab.  Signed, Platon Arocho [place], MD  10/06/2016, 9:29 AM

## 2016-10-08 NOTE — Telephone Encounter (Signed)
TOC call to patient no answer.No voice mail. 

## 2016-10-08 NOTE — Telephone Encounter (Signed)
Tried to call no voice mail set up

## 2016-10-15 ENCOUNTER — Encounter: Payer: Self-pay | Admitting: Cardiology

## 2016-10-15 ENCOUNTER — Other Ambulatory Visit: Payer: Self-pay | Admitting: Cardiology

## 2016-10-15 ENCOUNTER — Ambulatory Visit (INDEPENDENT_AMBULATORY_CARE_PROVIDER_SITE_OTHER): Payer: BLUE CROSS/BLUE SHIELD | Admitting: Cardiology

## 2016-10-15 DIAGNOSIS — I051 Rheumatic mitral insufficiency: Secondary | ICD-10-CM | POA: Diagnosis not present

## 2016-10-15 DIAGNOSIS — I517 Cardiomegaly: Secondary | ICD-10-CM

## 2016-10-15 DIAGNOSIS — I420 Dilated cardiomyopathy: Secondary | ICD-10-CM

## 2016-10-15 DIAGNOSIS — I428 Other cardiomyopathies: Secondary | ICD-10-CM | POA: Insufficient documentation

## 2016-10-15 DIAGNOSIS — Z7901 Long term (current) use of anticoagulants: Secondary | ICD-10-CM | POA: Insufficient documentation

## 2016-10-15 DIAGNOSIS — I509 Heart failure, unspecified: Secondary | ICD-10-CM | POA: Insufficient documentation

## 2016-10-15 DIAGNOSIS — I4891 Unspecified atrial fibrillation: Secondary | ICD-10-CM

## 2016-10-15 HISTORY — DX: Other cardiomyopathies: I42.8

## 2016-10-15 LAB — BASIC METABOLIC PANEL WITH GFR
BUN: 18 mg/dL (ref 7–25)
CO2: 27 mmol/L (ref 20–31)
Calcium: 9.6 mg/dL (ref 8.6–10.3)
Chloride: 103 mmol/L (ref 98–110)
Creat: 1.04 mg/dL (ref 0.70–1.33)
GFR, Est African American: >89 mL/min (ref >=60)
GFR, Est Non African American: 78 mL/min (ref >=60)
Glucose, Bld: 87 mg/dL (ref 65–99)
Potassium: 5 mmol/L (ref 3.5–5.3)
Sodium: 138 mmol/L (ref 135–146)

## 2016-10-15 LAB — CBC WITH DIFFERENTIAL/PLATELET
Basophils Absolute: 154 cells/uL (ref 0–200)
Basophils Relative: 1 %
Eosinophils Absolute: 154 cells/uL (ref 15–500)
Eosinophils Relative: 1 %
HCT: 47 % (ref 38.5–50.0)
Hemoglobin: 15.7 g/dL (ref 13.2–17.1)
Lymphocytes Relative: 21 %
Lymphs Abs: 3234 cells/uL (ref 850–3900)
MCH: 30.7 pg (ref 27.0–33.0)
MCHC: 33.4 g/dL (ref 32.0–36.0)
MCV: 91.8 fL (ref 80.0–100.0)
MPV: 10.1 fL (ref 7.5–12.5)
Monocytes Absolute: 1078 cells/uL — ABNORMAL HIGH (ref 200–950)
Monocytes Relative: 7 %
Neutro Abs: 10780 cells/uL — ABNORMAL HIGH (ref 1500–7800)
Neutrophils Relative %: 70 %
Platelets: 315 10*3/uL (ref 140–400)
RBC: 5.12 MIL/uL (ref 4.20–5.80)
RDW: 13.8 % (ref 11.0–15.0)
WBC: 15.4 10*3/uL — ABNORMAL HIGH (ref 3.8–10.8)

## 2016-10-15 LAB — APTT: aPTT: 27 s (ref 22–34)

## 2016-10-15 LAB — PROTIME-INR
INR: 1
Prothrombin Time: 11 s (ref 9.0–11.5)

## 2016-10-15 NOTE — Patient Instructions (Signed)
Medication Instructions: START taking your aspirin 81 mg daily.  DO NOT take Eliquis 12/11, 12/12, or 12/13   Labwork: Your physician recommends that you return for lab work today: CBC, BMP, PT-INR, and PTT   Any Other Special Instructions will be listed below: Your Catheterization with Dr. [place] is scheduled for 10/20/16.   If you need a refill on your cardiac medications before your next appointment, please call your pharmacy.

## 2016-10-15 NOTE — Assessment & Plan Note (Signed)
Admitted 10/02/16 with acute CHF in the setting of AF with RVR, CM with an EF of 40-45%, and severe MR Discharge wgt 158 lbs, wgt today 161.2 lbs

## 2016-10-15 NOTE — Assessment & Plan Note (Signed)
Eliquis added Nov 2017 for AF

## 2016-10-15 NOTE — Assessment & Plan Note (Signed)
Severe MR by TEE 10/04/16

## 2016-10-15 NOTE — Assessment & Plan Note (Signed)
LA was severely dilated by echo 10/03/16
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
## 2016-10-15 NOTE — Assessment & Plan Note (Signed)
New presentation 10/02/16- unknown duration. His rate is still a little fast but he is tolerating it well.

## 2016-10-15 NOTE — Assessment & Plan Note (Signed)
EF 40-45%, etiology not yet determined, for Rt and Lt cath 10/20/16

## 2016-10-18 ENCOUNTER — Telehealth: Payer: Self-pay | Admitting: Cardiology

## 2016-10-18 NOTE — Telephone Encounter (Signed)
New message ° ° ° ° ° °What can pt take for a cold? ° °

## 2016-10-18 NOTE — Telephone Encounter (Signed)
Spoke with wife passed on our recommendations for heart/BP medication with no decongestants (anything without the -D) and tylenol for bodyaches, fever

## 2016-10-20 ENCOUNTER — Encounter (HOSPITAL_COMMUNITY)
Admission: RE | Disposition: A | Discharge status: Home / Self Care | Payer: Self-pay | Source: Ambulatory Visit | Attending: Cardiology

## 2016-10-20 ENCOUNTER — Other Ambulatory Visit: Payer: Self-pay | Admitting: Cardiology

## 2016-10-20 ENCOUNTER — Ambulatory Visit (HOSPITAL_COMMUNITY)
Admission: RE | Admit: 2016-10-20 | Discharge: 2016-10-20 | Disposition: A | Discharge status: Home / Self Care | Payer: BLUE CROSS/BLUE SHIELD | Source: Ambulatory Visit | Attending: Cardiology | Admitting: Cardiology

## 2016-10-20 DIAGNOSIS — I509 Heart failure, unspecified: Secondary | ICD-10-CM | POA: Diagnosis not present

## 2016-10-20 DIAGNOSIS — Z7982 Long term (current) use of aspirin: Secondary | ICD-10-CM | POA: Insufficient documentation

## 2016-10-20 DIAGNOSIS — I429 Cardiomyopathy, unspecified: Secondary | ICD-10-CM | POA: Diagnosis not present

## 2016-10-20 DIAGNOSIS — I4891 Unspecified atrial fibrillation: Secondary | ICD-10-CM | POA: Insufficient documentation

## 2016-10-20 DIAGNOSIS — I051 Rheumatic mitral insufficiency: Secondary | ICD-10-CM | POA: Diagnosis not present

## 2016-10-20 DIAGNOSIS — Z7901 Long term (current) use of anticoagulants: Secondary | ICD-10-CM | POA: Insufficient documentation

## 2016-10-20 DIAGNOSIS — I5022 Chronic systolic (congestive) heart failure: Secondary | ICD-10-CM | POA: Diagnosis present

## 2016-10-20 DIAGNOSIS — F17210 Nicotine dependence, cigarettes, uncomplicated: Secondary | ICD-10-CM | POA: Insufficient documentation

## 2016-10-20 DIAGNOSIS — I428 Other cardiomyopathies: Secondary | ICD-10-CM

## 2016-10-20 HISTORY — PX: CARDIAC CATHETERIZATION: SHX172

## 2016-10-20 LAB — POCT I-STAT 3, ART BLOOD GAS (G3+)
ACID-BASE DEFICIT: 1 mmol/L (ref 0.0–2.0)
Bicarbonate: 24.1 mmol/L (ref 20.0–28.0)
O2 SAT: 98 %
PCO2 ART: 38.8 mmHg (ref 32.0–48.0)
TCO2: 25 mmol/L (ref 0–100)
pH, Arterial: 7.4 (ref 7.350–7.450)
pO2, Arterial: 110 mmHg — ABNORMAL HIGH (ref 83.0–108.0)

## 2016-10-20 LAB — POCT I-STAT 3, VENOUS BLOOD GAS (G3P V)
ACID-BASE DEFICIT: 1 mmol/L (ref 0.0–2.0)
BICARBONATE: 24.9 mmol/L (ref 20.0–28.0)
O2 Saturation: 56 %
PH VEN: 7.356 (ref 7.250–7.430)
TCO2: 26 mmol/L (ref 0–100)
pCO2, Ven: 44.5 mmHg (ref 44.0–60.0)
pO2, Ven: 31 mmHg — CL (ref 32.0–45.0)

## 2016-10-20 SURGERY — RIGHT/LEFT HEART CATH AND CORONARY ANGIOGRAPHY

## 2016-10-20 MED ORDER — HEPARIN SODIUM (PORCINE) 1000 UNIT/ML IJ SOLN
INTRAMUSCULAR | Status: AC
  Filled 2016-10-20: qty 1

## 2016-10-20 MED ORDER — IOPAMIDOL (ISOVUE-370) INJECTION 76%
INTRAVENOUS | Status: AC
  Filled 2016-10-20: qty 100

## 2016-10-20 MED ORDER — SODIUM CHLORIDE 0.9 % WEIGHT BASED INFUSION
3.0000 mL/kg/h | INTRAVENOUS | Status: AC
  Administered 2016-10-20: 3 mL/kg/h via INTRAVENOUS

## 2016-10-20 MED ORDER — SODIUM CHLORIDE 0.9% FLUSH: 3.0000 mL | INTRAVENOUS | Status: DC | PRN

## 2016-10-20 MED ORDER — LIDOCAINE HCL (PF) 1 % IJ SOLN
INTRAMUSCULAR | Status: AC
  Filled 2016-10-20: qty 30

## 2016-10-20 MED ORDER — VERAPAMIL HCL 2.5 MG/ML IV SOLN
INTRAVENOUS | Status: AC
  Filled 2016-10-20: qty 2

## 2016-10-20 MED ORDER — MIDAZOLAM HCL 2 MG/2ML IJ SOLN
INTRAMUSCULAR | Status: AC
  Filled 2016-10-20: qty 2

## 2016-10-20 MED ORDER — HEPARIN (PORCINE) IN NACL 2-0.9 UNIT/ML-% IJ SOLN
INTRAMUSCULAR | Status: DC | PRN
Start: 2016-10-20 — End: 2016-10-20
  Administered 2016-10-20: 1000 mL

## 2016-10-20 MED ORDER — IOPAMIDOL (ISOVUE-370) INJECTION 76%
INTRAVENOUS | Status: DC | PRN
  Administered 2016-10-20: 85 mL via INTRA_ARTERIAL

## 2016-10-20 MED ORDER — SODIUM CHLORIDE 0.9% FLUSH: 3.0000 mL | Freq: Two times a day (BID) | INTRAVENOUS | Status: DC

## 2016-10-20 MED ORDER — FENTANYL CITRATE (PF) 100 MCG/2ML IJ SOLN
INTRAMUSCULAR | Status: AC
  Filled 2016-10-20: qty 2

## 2016-10-20 MED ORDER — HEPARIN SODIUM (PORCINE) 1000 UNIT/ML IJ SOLN
INTRAMUSCULAR | Status: DC | PRN
  Administered 2016-10-20: 4000 [IU] via INTRAVENOUS

## 2016-10-20 MED ORDER — SODIUM CHLORIDE 0.9 % WEIGHT BASED INFUSION: 1.0000 mL/kg/h | INTRAVENOUS | Status: DC

## 2016-10-20 MED ORDER — ASPIRIN 81 MG PO CHEW: 81.0000 mg | CHEWABLE_TABLET | ORAL | Status: DC

## 2016-10-20 MED ORDER — AMIODARONE HCL 200 MG PO TABS: 400.0000 mg | ORAL_TABLET | Freq: Every day | ORAL | 3 refills | Status: DC

## 2016-10-20 MED ORDER — LIDOCAINE HCL (PF) 1 % IJ SOLN
INTRAMUSCULAR | Status: DC | PRN
  Administered 2016-10-20 (×2): 2 mL

## 2016-10-20 MED ORDER — SODIUM CHLORIDE 0.9 % IV SOLN: 250.0000 mL | INTRAVENOUS | Status: DC | PRN

## 2016-10-20 MED ORDER — FENTANYL CITRATE (PF) 100 MCG/2ML IJ SOLN
INTRAMUSCULAR | Status: DC | PRN
  Administered 2016-10-20: 25 ug via INTRAVENOUS

## 2016-10-20 MED ORDER — VERAPAMIL HCL 2.5 MG/ML IV SOLN
INTRAVENOUS | Status: DC | PRN
  Administered 2016-10-20: 10:00:00 via INTRA_ARTERIAL

## 2016-10-20 MED ORDER — HEPARIN (PORCINE) IN NACL 2-0.9 UNIT/ML-% IJ SOLN
INTRAMUSCULAR | Status: AC
  Filled 2016-10-20: qty 1000

## 2016-10-20 MED ORDER — MIDAZOLAM HCL 2 MG/2ML IJ SOLN
INTRAMUSCULAR | Status: DC | PRN
Start: 2016-10-20 — End: 2016-10-20
  Administered 2016-10-20: 1 mg via INTRAVENOUS

## 2016-10-20 SURGICAL SUPPLY — 14 items
CATH BALLN WEDGE 5F 110CM (CATHETERS) ×2 IMPLANT
CATH EXPO 5F FL3.5 (CATHETERS) ×2 IMPLANT
CATH EXPO 5FR ANG PIGTAIL 145 (CATHETERS) ×2 IMPLANT
CATH INFINITI JR4 5F (CATHETERS) ×2 IMPLANT
DEVICE RAD COMP TR BAND LRG (VASCULAR PRODUCTS) ×2 IMPLANT
GLIDESHEATH SLEND SS 6F .021 (SHEATH) ×2 IMPLANT
GUIDEWIRE INQWIRE 1.5J.035X260 (WIRE) ×1 IMPLANT
INQWIRE 1.5J .035X260CM (WIRE) ×2
KIT HEART LEFT (KITS) ×2 IMPLANT
PACK CARDIAC CATHETERIZATION (CUSTOM PROCEDURE TRAY) ×2 IMPLANT
SHEATH FAST CATH BRACH 5F 5CM (SHEATH) ×2 IMPLANT
SYR MEDRAD MARK V 150ML (SYRINGE) ×2 IMPLANT
TRANSDUCER W/STOPCOCK (MISCELLANEOUS) ×2 IMPLANT
TUBING CIL FLEX 10 FLL-RA (TUBING) ×2 IMPLANT

## 2016-10-20 NOTE — Interval H&P Note (Signed)
History and Physical Interval Note:  10/20/2016 9:52 AM  [name] [name]  has presented today for surgery, with the diagnosis of chf - severe mr  The various methods of treatment have been discussed with the patient and family. After consideration of risks, benefits and other options for treatment, the patient has consented to  Procedure(s): Right/Left Heart Cath and Coronary Angiography (N/A) as a surgical intervention .  The patient's history has been reviewed, patient examined, no change in status, stable for surgery.  I have reviewed the patient's chart and labs.  Questions were answered to the patient's satisfaction.     Theron Arista [facility] 10/20/2016 9:53 AM
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
## 2016-10-20 NOTE — Discharge Instructions (Addendum)
May resume Eliquis tonight.  Start Amiodarone 400 mg daily for your heart rhythm      Radial Site Care Introduction Refer to this sheet in the next few weeks. These instructions provide you with information about caring for yourself after your procedure. Your health care provider may also give you more specific instructions. Your treatment has been planned according to current medical practices, but problems sometimes occur. Call your health care provider if you have any problems or questions after your procedure. What can I expect after the procedure? After your procedure, it is typical to have the following:  Bruising at the radial site that usually fades within 1-2 weeks.  Blood collecting in the tissue (hematoma) that may be painful to the touch. It should usually decrease in size and tenderness within 1-2 weeks. Follow these instructions at home:  Take medicines only as directed by your health care provider.  You may shower 24-48 hours after the procedure or as directed by your health care provider. Remove the bandage (dressing) and gently wash the site with plain soap and water. Pat the area dry with a clean towel. Do not rub the site, because this may cause bleeding.  Do not take baths, swim, or use a hot tub until your health care provider approves.  Check your insertion site every day for redness, swelling, or drainage.  Do not apply powder or lotion to the site.  Do not flex or bend the affected arm for 24 hours or as directed by your health care provider.  Do not push or pull heavy objects with the affected arm for 24 hours or as directed by your health care provider.  Do not lift over 10 lb (4.5 kg) for 5 days after your procedure or as directed by your health care provider.  Ask your health care provider when it is okay to:  Return to work or school.  Resume usual physical activities or sports.  Resume sexual activity.  Do not drive home if you are discharged  the same day as the procedure. Have someone else drive you.  You may drive 24 hours after the procedure unless otherwise instructed by your health care provider.  Do not operate machinery or power tools for 24 hours after the procedure.  If your procedure was done as an outpatient procedure, which means that you went home the same day as your procedure, a responsible adult should be with you for the first 24 hours after you arrive home.  Keep all follow-up visits as directed by your health care provider. This is important. Contact a health care provider if:  You have a fever.  You have chills.  You have increased bleeding from the radial site. Hold pressure on the site. Get help right away if:  You have unusual pain at the radial site.  You have redness, warmth, or swelling at the radial site.  You have drainage (other than a small amount of blood on the dressing) from the radial site.  The radial site is bleeding, and the bleeding does not stop after 30 minutes of holding steady pressure on the site.  Your arm or hand becomes pale, cool, tingly, or numb. This information is not intended to replace advice given to you by your health care provider. Make sure you discuss any questions you have with your health care provider. Document Released: 11/27/2010 Document Revised: 04/01/2016 Document Reviewed: 05/13/2014  2017 [employer]

## 2016-10-21 ENCOUNTER — Encounter (HOSPITAL_COMMUNITY): Payer: Self-pay | Admitting: Cardiology

## 2016-10-25 ENCOUNTER — Other Ambulatory Visit: Payer: Self-pay | Admitting: *Deleted

## 2016-10-25 ENCOUNTER — Telehealth: Payer: Self-pay | Admitting: Cardiology

## 2016-10-25 ENCOUNTER — Institutional Professional Consult (permissible substitution) (INDEPENDENT_AMBULATORY_CARE_PROVIDER_SITE_OTHER): Payer: BLUE CROSS/BLUE SHIELD | Admitting: Thoracic Surgery (Cardiothoracic Vascular Surgery)

## 2016-10-25 ENCOUNTER — Encounter: Payer: Self-pay | Admitting: Thoracic Surgery (Cardiothoracic Vascular Surgery)

## 2016-10-25 VITALS — BP 100/70 | HR 76 | Resp 16 | Ht 70.0 in | Wt 161.0 lb

## 2016-10-25 DIAGNOSIS — I712 Thoracic aortic aneurysm, without rupture, unspecified: Secondary | ICD-10-CM

## 2016-10-25 DIAGNOSIS — I4819 Other persistent atrial fibrillation: Secondary | ICD-10-CM

## 2016-10-25 DIAGNOSIS — I5022 Chronic systolic (congestive) heart failure: Secondary | ICD-10-CM

## 2016-10-25 DIAGNOSIS — I051 Rheumatic mitral insufficiency: Secondary | ICD-10-CM

## 2016-10-25 DIAGNOSIS — I4891 Unspecified atrial fibrillation: Secondary | ICD-10-CM

## 2016-10-25 DIAGNOSIS — I420 Dilated cardiomyopathy: Secondary | ICD-10-CM

## 2016-10-25 DIAGNOSIS — I481 Persistent atrial fibrillation: Secondary | ICD-10-CM

## 2016-10-25 DIAGNOSIS — I340 Nonrheumatic mitral (valve) insufficiency: Secondary | ICD-10-CM

## 2016-10-25 DIAGNOSIS — Z01818 Encounter for other preprocedural examination: Secondary | ICD-10-CM

## 2016-10-25 NOTE — Telephone Encounter (Signed)
New message  Pt is having trouble breathing at night/waking up every 2 hours  Going on for the past 3-4 nights  Please call back and advise

## 2016-10-25 NOTE — Telephone Encounter (Signed)
Spoke with pt states that he recently had left heart cath. He states that ever since he has been waking ~every 2 hours SOB denies any other symptoms, chest pain or pressure, no lightheaded or dizziness. Pt states that he get up and walks around and lies back down and he is fine and goes back to sleep, then wakes up again in 2 hours and does the same thing again. Pt states that he had follow up with sergeon and he told him that this happens sometimes and he is just healing. Pt would like an rx for something to sleep so he doesn't wake wup all night long. Please advise.

## 2016-10-25 NOTE — Progress Notes (Signed)
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 Details: The right wrist was prepped, draped, and anesthetized with 1% lidocaine. Using the modified Seldinger technique a 6 Fr slender sheath was placed in the right radial artery and a 5 French sheath was placed in the right brachial vein. A Swan-Ganz catheter was used for the right heart catheterization. Standard protocol was followed for recording of right heart pressures and sampling of oxygen saturations. Fick cardiac output was calculated. Standard Judkins catheters were used for selective coronary angiography and left ventriculography. There were no immediate procedural complications. The patient was transferred to the post catheterization recovery area  for further monitoring.  Contrast: 85 cc   Estimated blood loss <50 mL.  During this procedure the patient was administered the following to achieve and maintain moderate conscious sedation: Versed 1 mg, Fentanyl 25 mcg, while the patient's heart rate, blood pressure, and oxygen saturation were continuously monitored. The period of conscious sedation was 85 minutes, of which I was present face-to-face 100% of this time.    Complications   Complications documented before study signed (10/20/2016 10:47 AM EST)    No complications were associated with this study.  Documented by [name] [name] [place], MD - 10/20/2016 10:46 AM EST    Coronary Findings   Dominance: Right  Left Main  Vessel was injected. Vessel is normal in caliber. Vessel is angiographically normal.  Left Anterior Descending  Vessel was injected. Vessel is normal in caliber. Vessel is angiographically normal.  Ramus Intermedius  Vessel was injected. Vessel is normal in caliber. Vessel is angiographically normal.  Left Circumflex  Vessel was injected. Vessel is normal in caliber. Vessel is angiographically normal.  Right Coronary Artery  Vessel was injected. Vessel is normal in caliber. Vessel is angiographically normal.  Right Heart   Right Heart Pressures LV EDP is normal. Normal right heart pressures.    Right Atrium The right atrium is dilated.    Wall Motion              Left Heart   Left Ventricle The left ventricle is moderately dilated. There is severe left ventricular systolic dysfunction. LV end diastolic pressure is normal. The left ventricular ejection fraction is 25-35% by visual estimate. There are LV function abnormalities due to global hypokinesis. There is severe (4+) mitral regurgitation.    Coronary Diagrams   Diagnostic Diagram     Implants     No implant documentation for this case.  PACS Images   Show images for Cardiac catheterization   Link to Procedure Log   Procedure Log     Hemo Data   Flowsheet Row Most Recent Value  Fick Cardiac Output 2.8 L/min  Fick Cardiac Output Index 1.48 (L/min)/BSA  RA A Wave 5 mmHg  RA V Wave 6 mmHg  RA Mean 4 mmHg  RV Systolic Pressure 33 mmHg  RV Diastolic Pressure 0 mmHg  RV EDP 2 mmHg  [name] Systolic Pressure 35 mmHg  [name] Diastolic Pressure 12 mmHg  [name] Mean 21 mmHg  PW A Wave 18 mmHg  PW V Wave 18 mmHg  PW Mean 15 mmHg  AO Systolic Pressure 92 mmHg  AO Diastolic Pressure 55 mmHg  AO Mean 69 mmHg  LV Systolic Pressure 84 mmHg  LV Diastolic Pressure 2 mmHg  LV EDP 6 mmHg  Arterial Occlusion Pressure Extended Systolic Pressure 91 mmHg  Arterial Occlusion Pressure Extended Diastolic Pressure 58 mmHg  Arterial Occlusion Pressure Extended Mean Pressure 71 mmHg  Left Ventricular Apex Extended Systolic Pressure 88 mmHg  Left Ventricular Apex Extended Diastolic Pressure 3 mmHg  Left Ventricular Apex Extended EDP Pressure 12 mmHg  QP/QS 1  TPVR Index 14.16 HRUI  TSVR Index 46.52 HRUI  PVR SVR Ratio 0.09  TPVR/TSVR Ratio 0.3     Impression:  Patient has stage D severe symptomatic mitral regurgitation and persistent atrial fibrillation, presumably of recent onset.  I have personally reviewed the patient's recent transthoracic and transesophageal echocardiograms and diagnostic heart catheterization. He has at least moderate global left ventricular systolic dysfunction. There is severe mitral regurgitation with a broad central jet that fills the entire left atrium. There was flow reversal in the pulmonary veins. There is mild thickening of the free margins of both the anterior and posterior mitral valve leaflets and there may be some fixed restriction of leaflet mobility consistent with type IIIA dysfunction, although overall both leaflets seem to move and it is also possible that the patient has primarily type I dysfunction.  There is severe left atrial enlargement. Some of the anatomical findings are consistent with likely  rheumatic disease, and this would make sense given the patient's reported history of long-standing presence of a heart murmur. 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 In either case the patient would likely best be treated with surgical intervention. There remains a possibility that his mitral valve may be repairable, although the patient may require mitral valve replacement if there is extensive fibrosis and scarring of the valve leaflets or subvalvular apparatus. Finally, I feel the patient might benefit from concomitant maze procedure, and he may be a good candidate for minimally invasive approach.   Plan:  The patient and his wife were counseled at length regarding the indications, risks and potential benefits of mitral valve repair or replacement and maze procedure.  The rationale for elective surgery has been explained, including a comparison between surgery and continued medical therapy with close follow-up.  The likelihood of successful and durable valve repair has been discussed with particular reference to the findings of their recent echocardiogram.  Based upon these findings and previous experience, I have quoted them a less than 50 percent likelihood of successful valve repair.  In the event that his valve cannot be successfully repaired, we discussed the possibility of replacing the mitral valve using a mechanical prosthesis with the attendant need for long-term anticoagulation versus the alternative of replacing it using a bioprosthetic tissue valve with its potential for late structural valve deterioration and failure, depending upon the patient's longevity.  The patient specifically requests that if the mitral valve must be replaced that  it be done using a mechanical valve.   The patient understands and accepts all potential risks of surgery including but not  limited to risk of death, stroke or other neurologic complication, myocardial infarction, congestive heart failure, respiratory failure, renal failure, bleeding requiring transfusion and/or reexploration, arrhythmia, infection or other wound complications, pneumonia, pleural and/or pericardial effusion, pulmonary embolus, aortic dissection or other major vascular complication, or delayed complications related to valve repair or replacement including but not limited to structural valve deterioration and failure, thrombosis, embolization, endocarditis, or paravalvular leak.  Alternative surgical approaches have been discussed including a comparison between conventional sternotomy and minimally-invasive techniques.  The relative risks and benefits of each have been reviewed as they pertain to the patient's specific circumstances, and all of their questions have been addressed.  Specific risks potentially related to the minimally-invasive approach were discussed at length, including but not limited to risk of conversion to full or partial sternotomy, aortic dissection or other major vascular complication, unilateral acute lung injury or pulmonary edema, phrenic nerve dysfunction or paralysis, rib fracture, chronic pain, lung hernia, or lymphocele.   All of their questions have been answered.  We tentatively plan to proceed with surgery on 11/11/2016. Prior to surgery we will obtain a CT angiogram of the aorta and iliac vessels to evaluate the feasibility of peripheral cannulation for surgery. The patient has been instructed to stop taking Eliquis 1 week prior to surgery. He will remain on all other medications without change through the night before surgery.  On the morning of surgery he will take only metoprolol with a sip of water.   I spent in excess of 90 minutes during the conduct of this  office consultation and >50% of this time involved direct face-to-face encounter with the patient for counseling and/or coordination of their care.   Salvatore Decent. Cornelius Moras, MD 10/25/2016 3:55 PM

## 2016-10-25 NOTE — Telephone Encounter (Signed)
Returned call to patient.Dr.Jordan's advice given.Advised to call back if not better.

## 2016-10-25 NOTE — Telephone Encounter (Signed)
Tried to call  will call tater

## 2016-10-25 NOTE — Patient Instructions (Signed)
No smoking!  Stop taking Eliquis on Thursday 11/04/2016  Continue taking all other medications without change through the day before surgery.  Have nothing to eat or drink after midnight the night before surgery.  On the morning of surgery take only metoprolol (Lopressor) with a sip of water.

## 2016-10-25 NOTE — Telephone Encounter (Signed)
Sounds like he is having PND. Would increase lasix to bid for 3 days to see if this helps. Scheduled for surgery Jan 4 with Dr. Cornelius Moras.  Asharia Lotter [place] MD, [facility]

## 2016-10-25 NOTE — Telephone Encounter (Signed)
waking ~every 2 hours at night

## 2016-10-27 ENCOUNTER — Encounter (HOSPITAL_COMMUNITY): Payer: Self-pay | Admitting: Dentistry

## 2016-10-27 ENCOUNTER — Ambulatory Visit (HOSPITAL_COMMUNITY): Payer: Self-pay | Admitting: Dentistry

## 2016-10-27 VITALS — BP 100/48 | HR 59 | Temp 97.8°F

## 2016-10-27 DIAGNOSIS — M264 Malocclusion, unspecified: Secondary | ICD-10-CM

## 2016-10-27 DIAGNOSIS — K08409 Partial loss of teeth, unspecified cause, unspecified class: Secondary | ICD-10-CM

## 2016-10-27 DIAGNOSIS — K06010 Localized gingival recession, unspecified: Secondary | ICD-10-CM

## 2016-10-27 DIAGNOSIS — K036 Deposits [accretions] on teeth: Secondary | ICD-10-CM

## 2016-10-27 DIAGNOSIS — Z7901 Long term (current) use of anticoagulants: Secondary | ICD-10-CM

## 2016-10-27 DIAGNOSIS — I340 Nonrheumatic mitral (valve) insufficiency: Secondary | ICD-10-CM

## 2016-10-27 DIAGNOSIS — K0530 Chronic periodontitis, unspecified: Secondary | ICD-10-CM

## 2016-10-27 DIAGNOSIS — K045 Chronic apical periodontitis: Secondary | ICD-10-CM

## 2016-10-27 DIAGNOSIS — K029 Dental caries, unspecified: Secondary | ICD-10-CM

## 2016-10-27 DIAGNOSIS — Z01818 Encounter for other preprocedural examination: Secondary | ICD-10-CM

## 2016-10-27 DIAGNOSIS — K06020 Generalized gingival recession, unspecified: Secondary | ICD-10-CM

## 2016-10-27 MED ORDER — AMOXICILLIN 500 MG PO CAPS: ORAL_CAPSULE | ORAL | 3 refills | Status: DC

## 2016-10-27 MED ORDER — CHLORHEXIDINE GLUCONATE 0.12 % MT SOLN: OROMUCOSAL | 99 refills | Status: DC

## 2016-10-27 NOTE — Patient Instructions (Signed)
[facility]    Department of Dental Medicine     DR. KULINSKI      HEART VALVES AND MOUTH CARE:  FACTS:   If you have any infection in your mouth, it can infect your heart valve.  If you heart valve is infected, you will be seriously ill.  Infections in the mouth can be SILENT and do not always cause pain.  Examples of infections in the mouth are gum disease, dental cavities, and abscesses.  Some possible signs of infection are: Bad breath, bleeding gums, or teeth that are sensitive to sweets, hot, and/or cold. There are many other signs as well.  WHAT YOU HAVE TO DO:   Brush your teeth after meals and at bedtime. Spend at least 2 minutes brushing well, especially behind your back teeth and all around your teeth that stand alone. Brush at the gumline also.  Do not go to bed without brushing your teeth and flossing.  If you gums bleed when you brush or floss, do NOT stop brushing or flossing. It usually means that your gums need more attention and better cleaning.   If your Dentist or Dr. Kulinski gave you a prescription mouthwash to use, make sure to use it as directed. If you run out of the medication, get a refill at the pharmacy.   If you were given any other medications or directions by your Dentist, please follow them. If you did not understand the directions or forget what you were told, please call. We will be happy to refresh her memory.  If you need antibiotics before dental procedures, make sure you take them one hour prior to every dental visit as directed.   Get a dental checkup every 4-6 months in order to keep your mouth healthy, or to find and treat any new infection. You will most likely need your teeth cleaned or gums treated at the same time.  If you are not able to come in for your scheduled appointment, call your Dentist as soon as possible to reschedule.  If you have a problem in between dental visits, call your Dentist.  
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
## 2016-11-02 ENCOUNTER — Ambulatory Visit
Admission: RE | Admit: 2016-11-02 | Discharge: 2016-11-02 | Disposition: A | Discharge status: Home / Self Care | Payer: BLUE CROSS/BLUE SHIELD | Source: Ambulatory Visit | Attending: Thoracic Surgery (Cardiothoracic Vascular Surgery) | Admitting: Thoracic Surgery (Cardiothoracic Vascular Surgery)

## 2016-11-02 DIAGNOSIS — I712 Thoracic aortic aneurysm, without rupture, unspecified: Secondary | ICD-10-CM

## 2016-11-02 DIAGNOSIS — Z01818 Encounter for other preprocedural examination: Secondary | ICD-10-CM

## 2016-11-02 MED ORDER — IOPAMIDOL (ISOVUE-370) INJECTION 76%
75.0000 mL | Freq: Once | INTRAVENOUS | Status: AC | PRN
  Administered 2016-11-02: 75 mL via INTRAVENOUS

## 2016-11-05 NOTE — Pre-Procedure Instructions (Addendum)
[name] [name]  11/05/2016      [id number] [address] [place] [address] Phone: [phone] Fax: [phone]    Your procedure is scheduled on January 4  Report to [facility] Admitting at 0530 A.M.  Call this number if you have problems the morning of surgery:  [phone]   Remember:  Do not eat food or drink liquids after midnight.   Take these medicines the morning of surgery with A SIP OF WATER metoprolol (LOPRESSOR), Amiodarone   7 days prior to surgery STOP taking any Aspirin, Aleve, Naproxen, Ibuprofen, Motrin, Advil, Goody's, BC's, all herbal medications, fish oil, and all vitamins,   ELIQUIS IS ALREADY STOPPED    Do not wear jewelry  Do not wear lotions, powders, or cologne, or deoderant.  Men may shave face and neck.  Do not bring valuables to the hospital.  [facility] is not responsible for any belongings or valuables.  Contacts, dentures or bridgework may not be worn into surgery.  Leave your suitcase in the car.  After surgery it may be brought to your room.  For patients admitted to the hospital, discharge time will be determined by your treatment team.  Patients discharged the day of surgery will not be allowed to drive home.    Special instructions:   [place]- Preparing For Surgery  Before surgery, you can play an important role. Because skin is not sterile, your skin needs to be as free of germs as possible. You can reduce the number of germs on your skin by washing with CHG (chlorahexidine gluconate) Soap before surgery.  CHG is an antiseptic cleaner which kills germs and bonds with the skin to continue killing germs even after washing.  Please do not use if you have an allergy to CHG or antibacterial soaps. If your skin becomes reddened/irritated stop using the CHG.  Do not shave (including legs and underarms) for at least 48 hours prior to first CHG shower. It is OK to shave  your face.  Please follow these instructions carefully.   1. Shower the NIGHT BEFORE SURGERY and the MORNING OF SURGERY with CHG.   2. If you chose to wash your hair, wash your hair first as usual with your normal shampoo.  3. After you shampoo, rinse your hair and body thoroughly to remove the shampoo.  4. Use CHG as you would any other liquid soap. You can apply CHG directly to the skin and wash gently with a scrungie or a clean washcloth.   5. Apply the CHG Soap to your body ONLY FROM THE NECK DOWN.  Do not use on open wounds or open sores. Avoid contact with your eyes, ears, mouth and genitals (private parts). Wash genitals (private parts) with your normal soap.  6. Wash thoroughly, paying special attention to the area where your surgery will be performed.  7. Thoroughly rinse your body with warm water from the neck down.  8. DO NOT shower/wash with your normal soap after using and rinsing off the CHG Soap.  9. Pat yourself dry with a CLEAN TOWEL.   10. Wear CLEAN PAJAMAS   11. Place CLEAN SHEETS on your bed the night of your first shower and DO NOT SLEEP WITH PETS.    Day of Surgery: Do not apply any deodorants/lotions. Please wear clean clothes to the hospital/surgery center.     Please read over the following fact sheets that you were  given.

## 2016-11-09 ENCOUNTER — Ambulatory Visit (HOSPITAL_COMMUNITY)
Admission: RE | Admit: 2016-11-09 | Discharge: 2016-11-09 | Disposition: A | Discharge status: Home / Self Care | Payer: BLUE CROSS/BLUE SHIELD | Source: Ambulatory Visit | Attending: Thoracic Surgery (Cardiothoracic Vascular Surgery) | Admitting: Thoracic Surgery (Cardiothoracic Vascular Surgery)

## 2016-11-09 ENCOUNTER — Encounter (HOSPITAL_COMMUNITY)
Admission: RE | Admit: 2016-11-09 | Discharge: 2016-11-09 | Disposition: A | Discharge status: Home / Self Care | Payer: BLUE CROSS/BLUE SHIELD | Source: Ambulatory Visit | Attending: Thoracic Surgery (Cardiothoracic Vascular Surgery) | Admitting: Thoracic Surgery (Cardiothoracic Vascular Surgery)

## 2016-11-09 ENCOUNTER — Encounter (HOSPITAL_COMMUNITY): Payer: Self-pay

## 2016-11-09 DIAGNOSIS — R42 Dizziness and giddiness: Secondary | ICD-10-CM | POA: Insufficient documentation

## 2016-11-09 DIAGNOSIS — I340 Nonrheumatic mitral (valve) insufficiency: Secondary | ICD-10-CM | POA: Diagnosis not present

## 2016-11-09 DIAGNOSIS — M542 Cervicalgia: Secondary | ICD-10-CM | POA: Insufficient documentation

## 2016-11-09 DIAGNOSIS — I509 Heart failure, unspecified: Secondary | ICD-10-CM | POA: Diagnosis not present

## 2016-11-09 DIAGNOSIS — I517 Cardiomegaly: Secondary | ICD-10-CM | POA: Diagnosis not present

## 2016-11-09 DIAGNOSIS — Z01812 Encounter for preprocedural laboratory examination: Secondary | ICD-10-CM | POA: Diagnosis not present

## 2016-11-09 DIAGNOSIS — I4891 Unspecified atrial fibrillation: Secondary | ICD-10-CM

## 2016-11-09 DIAGNOSIS — Z01818 Encounter for other preprocedural examination: Secondary | ICD-10-CM | POA: Diagnosis not present

## 2016-11-09 DIAGNOSIS — F17200 Nicotine dependence, unspecified, uncomplicated: Secondary | ICD-10-CM | POA: Insufficient documentation

## 2016-11-09 HISTORY — DX: Unspecified osteoarthritis, unspecified site: M19.90

## 2016-11-09 LAB — PULMONARY FUNCTION TEST
DL/VA % PRED: 85 %
DL/VA: 3.95 ml/min/mmHg/L
DLCO COR % PRED: 61 %
DLCO cor: 19.8 ml/min/mmHg
DLCO unc % pred: 61 %
DLCO unc: 19.91 ml/min/mmHg
FEF 25-75 POST: 4.01 L/s
FEF 25-75 Pre: 3.87 L/sec
FEF2575-%Change-Post: 3 %
FEF2575-%PRED-POST: 134 %
FEF2575-%Pred-Pre: 129 %
FEV1-%CHANGE-POST: 0 %
FEV1-%Pred-Post: 76 %
FEV1-%Pred-Pre: 76 %
FEV1-PRE: 2.76 L
FEV1-Post: 2.76 L
FEV1FVC-%CHANGE-POST: 2 %
FEV1FVC-%PRED-PRE: 111 %
FEV6-%Change-Post: -2 %
FEV6-%PRED-PRE: 71 %
FEV6-%Pred-Post: 69 %
FEV6-POST: 3.17 L
FEV6-Pre: 3.24 L
FEV6FVC-%Pred-Post: 104 %
FEV6FVC-%Pred-Pre: 104 %
FVC-%CHANGE-POST: -2 %
FVC-%PRED-POST: 66 %
FVC-%PRED-PRE: 67 %
FVC-POST: 3.17 L
FVC-PRE: 3.24 L
PRE FEV1/FVC RATIO: 85 %
PRE FEV6/FVC RATIO: 100 %
Post FEV1/FVC ratio: 87 %
Post FEV6/FVC ratio: 100 %
RV % PRED: 88 %
RV: 1.99 L
TLC % pred: 77 %
TLC: 5.44 L

## 2016-11-09 LAB — URINALYSIS, ROUTINE W REFLEX MICROSCOPIC
Bacteria, UA: NONE SEEN
Bilirubin Urine: NEGATIVE
GLUCOSE, UA: NEGATIVE mg/dL
HGB URINE DIPSTICK: NEGATIVE
Ketones, ur: NEGATIVE mg/dL
NITRITE: NEGATIVE
Protein, ur: NEGATIVE mg/dL
RBC / HPF: NONE SEEN RBC/hpf (ref 0–5)
SPECIFIC GRAVITY, URINE: 1.006 (ref 1.005–1.030)
Squamous Epithelial / LPF: NONE SEEN
pH: 5 (ref 5.0–8.0)

## 2016-11-09 LAB — CBC
HEMATOCRIT: 45.7 % (ref 39.0–52.0)
HEMOGLOBIN: 14.8 g/dL (ref 13.0–17.0)
MCH: 30.3 pg (ref 26.0–34.0)
MCHC: 32.4 g/dL (ref 30.0–36.0)
MCV: 93.5 fL (ref 78.0–100.0)
Platelets: 275 10*3/uL (ref 150–400)
RBC: 4.89 MIL/uL (ref 4.22–5.81)
RDW: 15.5 % (ref 11.5–15.5)
WBC: 10.5 10*3/uL (ref 4.0–10.5)

## 2016-11-09 LAB — VAS US DOPPLER PRE CABG
LCCADDIAS: 15 cm/s
LEFT ECA DIAS: -22 cm/s
LEFT VERTEBRAL DIAS: -17 cm/s
LICAPDIAS: -31 cm/s
Left CCA dist sys: 36 cm/s
Left CCA prox dias: 30 cm/s
Left CCA prox sys: 99 cm/s
Left ICA dist dias: -32 cm/s
Left ICA dist sys: -63 cm/s
Left ICA prox sys: -70 cm/s
RCCADSYS: -56 cm/s
RCCAPSYS: -90 cm/s
RIGHT ECA DIAS: -15 cm/s
RIGHT VERTEBRAL DIAS: -14 cm/s
Right CCA prox dias: -15 cm/s

## 2016-11-09 LAB — COMPREHENSIVE METABOLIC PANEL
ALK PHOS: 84 U/L (ref 38–126)
ALT: 23 U/L (ref 17–63)
AST: 22 U/L (ref 15–41)
Albumin: 4.1 g/dL (ref 3.5–5.0)
Anion gap: 6 (ref 5–15)
BUN: 17 mg/dL (ref 6–20)
CHLORIDE: 105 mmol/L (ref 101–111)
CO2: 24 mmol/L (ref 22–32)
CREATININE: 1.1 mg/dL (ref 0.61–1.24)
Calcium: 9.3 mg/dL (ref 8.9–10.3)
GFR calc Af Amer: >60 mL/min (ref >60)
Glucose, Bld: 95 mg/dL (ref 65–99)
Potassium: 4.6 mmol/L (ref 3.5–5.1)
Sodium: 135 mmol/L (ref 135–145)
Total Bilirubin: 0.7 mg/dL (ref 0.3–1.2)
Total Protein: 7.3 g/dL (ref 6.5–8.1)

## 2016-11-09 LAB — SURGICAL PCR SCREEN
MRSA, PCR: NEGATIVE
Staphylococcus aureus: NEGATIVE

## 2016-11-09 LAB — ABO/RH: ABO/RH(D): A POS

## 2016-11-09 LAB — APTT: aPTT: 31 seconds (ref 24–36)

## 2016-11-09 LAB — TYPE AND SCREEN
ABO/RH(D): A POS
Antibody Screen: NEGATIVE

## 2016-11-09 LAB — BLOOD GAS, ARTERIAL
Acid-Base Excess: 0.5 mmol/L (ref 0.0–2.0)
Bicarbonate: 24.1 mmol/L (ref 20.0–28.0)
DRAWN BY: 449841
FIO2: 0.21
O2 SAT: 98.1 %
PCO2 ART: 35.4 mmHg (ref 32.0–48.0)
PH ART: 7.448 (ref 7.350–7.450)
Patient temperature: 98.6
pO2, Arterial: 108 mmHg (ref 83.0–108.0)

## 2016-11-09 LAB — PROTIME-INR
INR: 0.99
PROTHROMBIN TIME: 13.1 s (ref 11.4–15.2)

## 2016-11-09 MED ORDER — ALBUTEROL SULFATE (2.5 MG/3ML) 0.083% IN NEBU
2.5000 mg | INHALATION_SOLUTION | Freq: Once | RESPIRATORY_TRACT | Status: AC
  Administered 2016-11-09: 2.5 mg via RESPIRATORY_TRACT

## 2016-11-09 NOTE — Progress Notes (Signed)
Pre-op Cardiac Surgery  Carotid Findings:   Findings are consistent with a 1-39 percent stenosis involving the right internal carotid artery and the left internal carotid artery. The vertebral arteries demonstrate antegrade flow.  Upper Extremity Right Left  Brachial Pressures 114  Triphasic 109  Triphasic  Radial Waveforms Triphasic Triphasic  Ulnar Waveforms Triphasic Triphasic  Palmar Arch (Allen's Test) Palmar waveforms are obliterated with radial compression and remain within normal limits with ulnar compression. Palmar waveforms are obliterated with radial and ulnar compression.   11/09/16 1:04 PM Olen Cordial RVT

## 2016-11-09 NOTE — Progress Notes (Signed)
   11/09/16 0849  OBSTRUCTIVE SLEEP APNEA  Have you ever been diagnosed with sleep apnea through a sleep study? No  Do you snore loudly (loud enough to be heard through closed doors)?  1  Do you often feel tired, fatigued, or sleepy during the daytime (such as falling asleep during driving or talking to someone)? 0  Has anyone observed you stop breathing during your sleep? 1  Do you have, or are you being treated for high blood pressure? 1  BMI more than 35 kg/m2? 0  Age > 50 (1-yes) 1  Neck circumference greater than:Male 16 inches or larger, Male 17inches or larger? 0  Male Gender (Yes=1) 1  Obstructive Sleep Apnea Score 5  Score 5 or greater  Results sent to PCP

## 2016-11-09 NOTE — Progress Notes (Signed)
Call from [place], ?from Radiology to note CXR done today.  Call to [facility] at Dr. Orvan July office. Asked that the CXR be reviewed .

## 2016-11-10 LAB — HEMOGLOBIN A1C
Hgb A1c MFr Bld: 5.6 % (ref 4.8–5.6)
Mean Plasma Glucose: 114 mg/dL

## 2016-11-10 MED ORDER — METOPROLOL TARTRATE 12.5 MG HALF TABLET: 12.5000 mg | ORAL_TABLET | Freq: Once | ORAL | Status: DC

## 2016-11-10 MED ORDER — VANCOMYCIN HCL 10 G IV SOLR
1250.0000 mg | INTRAVENOUS | Status: AC
  Administered 2016-11-11: 1250 mg via INTRAVENOUS
  Filled 2016-11-10: qty 1250

## 2016-11-10 MED ORDER — VANCOMYCIN HCL 1000 MG IV SOLR
INTRAVENOUS | Status: AC
  Administered 2016-11-11: 1000 mL
  Filled 2016-11-10: qty 1000

## 2016-11-10 MED ORDER — PHENYLEPHRINE HCL 10 MG/ML IJ SOLN
30.0000 ug/min | INTRAVENOUS | Status: AC
  Administered 2016-11-11: 25 ug/min via INTRAVENOUS
  Filled 2016-11-10: qty 2

## 2016-11-10 MED ORDER — TRANEXAMIC ACID (OHS) PUMP PRIME SOLUTION
2.0000 mg/kg | INTRAVENOUS | Status: DC
Start: 2016-11-11 — End: 2016-11-11
  Filled 2016-11-10: qty 1.52

## 2016-11-10 MED ORDER — NITROGLYCERIN IN D5W 200-5 MCG/ML-% IV SOLN
2.0000 ug/min | INTRAVENOUS | Status: DC
  Filled 2016-11-10: qty 250

## 2016-11-10 MED ORDER — DEXMEDETOMIDINE HCL IN NACL 400 MCG/100ML IV SOLN
0.1000 ug/kg/h | INTRAVENOUS | Status: AC
  Administered 2016-11-11: .2 ug/kg/h via INTRAVENOUS
  Filled 2016-11-10: qty 100

## 2016-11-10 MED ORDER — CHLORHEXIDINE GLUCONATE 0.12 % MT SOLN
15.0000 mL | Freq: Once | OROMUCOSAL | Status: AC
  Administered 2016-11-11: 15 mL via OROMUCOSAL
  Filled 2016-11-10: qty 15

## 2016-11-10 MED ORDER — SODIUM CHLORIDE 0.9 % IV SOLN
INTRAVENOUS | Status: AC
  Administered 2016-11-11: 1 [IU]/h via INTRAVENOUS
  Filled 2016-11-10: qty 2.5

## 2016-11-10 MED ORDER — DEXTROSE 5 % IV SOLN
1.5000 g | INTRAVENOUS | Status: AC
  Administered 2016-11-11: .75 g via INTRAVENOUS
  Administered 2016-11-11: 1.5 g via INTRAVENOUS
  Filled 2016-11-10: qty 1.5

## 2016-11-10 MED ORDER — MAGNESIUM SULFATE 50 % IJ SOLN
40.0000 meq | INTRAMUSCULAR | Status: DC
  Filled 2016-11-10: qty 10

## 2016-11-10 MED ORDER — TRANEXAMIC ACID 1000 MG/10ML IV SOLN
1.5000 mg/kg/h | INTRAVENOUS | Status: AC
  Administered 2016-11-11: 1.5 mg/kg/h via INTRAVENOUS
  Filled 2016-11-10: qty 25

## 2016-11-10 MED ORDER — SODIUM CHLORIDE 0.9 % IV SOLN
INTRAVENOUS | Status: DC
  Filled 2016-11-10: qty 30

## 2016-11-10 MED ORDER — EPINEPHRINE PF 1 MG/ML IJ SOLN
0.0000 ug/min | INTRAVENOUS | Status: AC
  Administered 2016-11-11: 3 ug/min via INTRAVENOUS
  Administered 2016-11-11: 2 ug/min via INTRAVENOUS
  Filled 2016-11-10: qty 4

## 2016-11-10 MED ORDER — PLASMA-LYTE 148 IV SOLN
INTRAVENOUS | Status: DC
  Filled 2016-11-10: qty 2.5

## 2016-11-10 MED ORDER — DOPAMINE-DEXTROSE 3.2-5 MG/ML-% IV SOLN
0.0000 ug/kg/min | INTRAVENOUS | Status: DC
  Filled 2016-11-10: qty 250

## 2016-11-10 MED ORDER — TRANEXAMIC ACID (OHS) BOLUS VIA INFUSION
15.0000 mg/kg | INTRAVENOUS | Status: AC
  Administered 2016-11-11: 1143 mg via INTRAVENOUS
  Filled 2016-11-10: qty 1143

## 2016-11-10 MED ORDER — POTASSIUM CHLORIDE 2 MEQ/ML IV SOLN
80.0000 meq | INTRAVENOUS | Status: DC
  Filled 2016-11-10: qty 40

## 2016-11-10 MED ORDER — DEXTROSE 5 % IV SOLN
750.0000 mg | INTRAVENOUS | Status: DC
  Filled 2016-11-10: qty 750

## 2016-11-10 MED ORDER — GLUTARALDEHYDE 0.625% SOAKING SOLUTION
TOPICAL | Status: DC | PRN
  Filled 2016-11-10 (×2): qty 50
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
## 2016-11-11 ENCOUNTER — Inpatient Hospital Stay (HOSPITAL_COMMUNITY): Payer: BLUE CROSS/BLUE SHIELD | Admitting: Vascular Surgery

## 2016-11-11 ENCOUNTER — Inpatient Hospital Stay (HOSPITAL_COMMUNITY): Payer: BLUE CROSS/BLUE SHIELD

## 2016-11-11 ENCOUNTER — Inpatient Hospital Stay (HOSPITAL_COMMUNITY)
Admission: RE | Admit: 2016-11-11 | Discharge: 2016-11-20 | DRG: 219 | Disposition: A | Discharge status: Home / Self Care | Payer: BLUE CROSS/BLUE SHIELD | Source: Ambulatory Visit | Attending: Thoracic Surgery (Cardiothoracic Vascular Surgery) | Admitting: Thoracic Surgery (Cardiothoracic Vascular Surgery)

## 2016-11-11 ENCOUNTER — Inpatient Hospital Stay (HOSPITAL_COMMUNITY): Payer: BLUE CROSS/BLUE SHIELD | Admitting: Anesthesiology

## 2016-11-11 ENCOUNTER — Encounter (HOSPITAL_COMMUNITY)
Admission: RE | Disposition: A | Discharge status: Home / Self Care | Payer: Self-pay | Source: Ambulatory Visit | Attending: Thoracic Surgery (Cardiothoracic Vascular Surgery)

## 2016-11-11 ENCOUNTER — Ambulatory Visit (HOSPITAL_COMMUNITY): Payer: BLUE CROSS/BLUE SHIELD

## 2016-11-11 ENCOUNTER — Encounter (HOSPITAL_COMMUNITY): Payer: Self-pay | Admitting: *Deleted

## 2016-11-11 DIAGNOSIS — Z954 Presence of other heart-valve replacement: Secondary | ICD-10-CM

## 2016-11-11 DIAGNOSIS — I081 Rheumatic disorders of both mitral and tricuspid valves: Principal | ICD-10-CM | POA: Diagnosis present

## 2016-11-11 DIAGNOSIS — I5043 Acute on chronic combined systolic (congestive) and diastolic (congestive) heart failure: Secondary | ICD-10-CM | POA: Diagnosis not present

## 2016-11-11 DIAGNOSIS — I340 Nonrheumatic mitral (valve) insufficiency: Secondary | ICD-10-CM

## 2016-11-11 DIAGNOSIS — Z7901 Long term (current) use of anticoagulants: Secondary | ICD-10-CM

## 2016-11-11 DIAGNOSIS — I4891 Unspecified atrial fibrillation: Secondary | ICD-10-CM

## 2016-11-11 DIAGNOSIS — I420 Dilated cardiomyopathy: Secondary | ICD-10-CM | POA: Diagnosis present

## 2016-11-11 DIAGNOSIS — I5022 Chronic systolic (congestive) heart failure: Secondary | ICD-10-CM | POA: Diagnosis not present

## 2016-11-11 DIAGNOSIS — M1990 Unspecified osteoarthritis, unspecified site: Secondary | ICD-10-CM | POA: Diagnosis not present

## 2016-11-11 DIAGNOSIS — D72829 Elevated white blood cell count, unspecified: Secondary | ICD-10-CM | POA: Diagnosis not present

## 2016-11-11 DIAGNOSIS — Z79899 Other long term (current) drug therapy: Secondary | ICD-10-CM | POA: Diagnosis not present

## 2016-11-11 DIAGNOSIS — Z4682 Encounter for fitting and adjustment of non-vascular catheter: Secondary | ICD-10-CM | POA: Diagnosis not present

## 2016-11-11 DIAGNOSIS — D6959 Other secondary thrombocytopenia: Secondary | ICD-10-CM | POA: Diagnosis not present

## 2016-11-11 DIAGNOSIS — I2510 Atherosclerotic heart disease of native coronary artery without angina pectoris: Secondary | ICD-10-CM | POA: Diagnosis not present

## 2016-11-11 DIAGNOSIS — I071 Rheumatic tricuspid insufficiency: Secondary | ICD-10-CM | POA: Diagnosis present

## 2016-11-11 DIAGNOSIS — I498 Other specified cardiac arrhythmias: Secondary | ICD-10-CM | POA: Diagnosis not present

## 2016-11-11 DIAGNOSIS — J9811 Atelectasis: Secondary | ICD-10-CM | POA: Diagnosis not present

## 2016-11-11 DIAGNOSIS — R918 Other nonspecific abnormal finding of lung field: Secondary | ICD-10-CM | POA: Diagnosis not present

## 2016-11-11 DIAGNOSIS — I481 Persistent atrial fibrillation: Secondary | ICD-10-CM | POA: Diagnosis present

## 2016-11-11 DIAGNOSIS — J984 Other disorders of lung: Secondary | ICD-10-CM | POA: Diagnosis not present

## 2016-11-11 DIAGNOSIS — D62 Acute posthemorrhagic anemia: Secondary | ICD-10-CM | POA: Diagnosis not present

## 2016-11-11 DIAGNOSIS — I272 Pulmonary hypertension, unspecified: Secondary | ICD-10-CM | POA: Diagnosis not present

## 2016-11-11 DIAGNOSIS — Z950 Presence of cardiac pacemaker: Secondary | ICD-10-CM

## 2016-11-11 DIAGNOSIS — Z952 Presence of prosthetic heart valve: Secondary | ICD-10-CM

## 2016-11-11 DIAGNOSIS — Z8679 Personal history of other diseases of the circulatory system: Secondary | ICD-10-CM

## 2016-11-11 DIAGNOSIS — I427 Cardiomyopathy due to drug and external agent: Secondary | ICD-10-CM | POA: Diagnosis not present

## 2016-11-11 DIAGNOSIS — I428 Other cardiomyopathies: Secondary | ICD-10-CM

## 2016-11-11 DIAGNOSIS — I361 Nonrheumatic tricuspid (valve) insufficiency: Secondary | ICD-10-CM | POA: Diagnosis not present

## 2016-11-11 DIAGNOSIS — I5023 Acute on chronic systolic (congestive) heart failure: Secondary | ICD-10-CM | POA: Diagnosis not present

## 2016-11-11 DIAGNOSIS — Z452 Encounter for adjustment and management of vascular access device: Secondary | ICD-10-CM | POA: Diagnosis not present

## 2016-11-11 DIAGNOSIS — I517 Cardiomegaly: Secondary | ICD-10-CM | POA: Diagnosis present

## 2016-11-11 DIAGNOSIS — R0602 Shortness of breath: Secondary | ICD-10-CM | POA: Diagnosis not present

## 2016-11-11 DIAGNOSIS — I4819 Other persistent atrial fibrillation: Secondary | ICD-10-CM | POA: Diagnosis present

## 2016-11-11 DIAGNOSIS — Z9889 Other specified postprocedural states: Secondary | ICD-10-CM

## 2016-11-11 DIAGNOSIS — I442 Atrioventricular block, complete: Secondary | ICD-10-CM | POA: Diagnosis present

## 2016-11-11 DIAGNOSIS — I492 Junctional premature depolarization: Secondary | ICD-10-CM | POA: Diagnosis not present

## 2016-11-11 DIAGNOSIS — I4510 Unspecified right bundle-branch block: Secondary | ICD-10-CM | POA: Diagnosis present

## 2016-11-11 DIAGNOSIS — I051 Rheumatic mitral insufficiency: Secondary | ICD-10-CM | POA: Diagnosis present

## 2016-11-11 DIAGNOSIS — Z45018 Encounter for adjustment and management of other part of cardiac pacemaker: Secondary | ICD-10-CM | POA: Diagnosis not present

## 2016-11-11 DIAGNOSIS — F17210 Nicotine dependence, cigarettes, uncomplicated: Secondary | ICD-10-CM | POA: Diagnosis present

## 2016-11-11 DIAGNOSIS — Z09 Encounter for follow-up examination after completed treatment for conditions other than malignant neoplasm: Secondary | ICD-10-CM

## 2016-11-11 HISTORY — DX: Presence of other heart-valve replacement: Z95.4

## 2016-11-11 HISTORY — PX: MITRAL VALVE REPAIR: SHX2039

## 2016-11-11 HISTORY — PX: MINIMALLY INVASIVE MAZE PROCEDURE: SHX6244

## 2016-11-11 HISTORY — PX: TRICUSPID VALVE REPLACEMENT: SHX816

## 2016-11-11 HISTORY — DX: Personal history of other diseases of the circulatory system: Z86.79

## 2016-11-11 HISTORY — DX: Rheumatic heart disease, unspecified: I09.9

## 2016-11-11 HISTORY — DX: Other cardiomyopathies: I42.8

## 2016-11-11 HISTORY — DX: Other specified postprocedural states: Z98.890

## 2016-11-11 HISTORY — PX: TEE WITHOUT CARDIOVERSION: SHX5443

## 2016-11-11 LAB — POCT I-STAT 3, ART BLOOD GAS (G3+)
ACID-BASE DEFICIT: 5 mmol/L — AB (ref 0.0–2.0)
ACID-BASE EXCESS: 1 mmol/L (ref 0.0–2.0)
Acid-base deficit: 2 mmol/L (ref 0.0–2.0)
Acid-base deficit: 1 mmol/L (ref 0.0–2.0)
BICARBONATE: 28 mmol/L (ref 20.0–28.0)
BICARBONATE: 27.1 mmol/L (ref 20.0–28.0)
BICARBONATE: 22.4 mmol/L (ref 20.0–28.0)
Bicarbonate: 25.3 mmol/L (ref 20.0–28.0)
O2 Saturation: 100 %
O2 Saturation: 100 %
O2 Saturation: 92 %
O2 Saturation: 91 %
PCO2 ART: 48.6 mmHg — AB (ref 32.0–48.0)
PCO2 ART: 44 mmHg (ref 32.0–48.0)
PH ART: 7.189 — AB (ref 7.350–7.450)
PH ART: 7.365 (ref 7.350–7.450)
PO2 ART: 348 mmHg — AB (ref 83.0–108.0)
PO2 ART: 417 mmHg — AB (ref 83.0–108.0)
PO2 ART: 69 mmHg — AB (ref 83.0–108.0)
PO2 ART: 63 mmHg — AB (ref 83.0–108.0)
Patient temperature: 36.3
Patient temperature: 36.4
TCO2: 30 mmol/L (ref 0–100)
TCO2: 29 mmol/L (ref 0–100)
TCO2: 24 mmol/L (ref 0–100)
TCO2: 27 mmol/L (ref 0–100)
pCO2 arterial: 73.5 mmHg (ref 32.0–48.0)
pCO2 arterial: 51 mmHg — ABNORMAL HIGH (ref 32.0–48.0)
pH, Arterial: 7.334 — ABNORMAL LOW (ref 7.350–7.450)
pH, Arterial: 7.269 — ABNORMAL LOW (ref 7.350–7.450)

## 2016-11-11 LAB — CBC
HCT: 40 % (ref 39.0–52.0)
HEMATOCRIT: 36.9 % — AB (ref 39.0–52.0)
HEMOGLOBIN: 12.2 g/dL — AB (ref 13.0–17.0)
Hemoglobin: 13 g/dL (ref 13.0–17.0)
MCH: 30.3 pg (ref 26.0–34.0)
MCH: 30.3 pg (ref 26.0–34.0)
MCHC: 32.5 g/dL (ref 30.0–36.0)
MCHC: 33.1 g/dL (ref 30.0–36.0)
MCV: 93.2 fL (ref 78.0–100.0)
MCV: 91.8 fL (ref 78.0–100.0)
PLATELETS: 161 10*3/uL (ref 150–400)
Platelets: 184 10*3/uL (ref 150–400)
RBC: 4.29 MIL/uL (ref 4.22–5.81)
RBC: 4.02 MIL/uL — ABNORMAL LOW (ref 4.22–5.81)
RDW: 15.6 % — AB (ref 11.5–15.5)
RDW: 15 % (ref 11.5–15.5)
WBC: 22.2 10*3/uL — AB (ref 4.0–10.5)
WBC: 21.5 10*3/uL — AB (ref 4.0–10.5)

## 2016-11-11 LAB — POCT I-STAT, CHEM 8
BUN: 21 mg/dL — ABNORMAL HIGH (ref 6–20)
BUN: 19 mg/dL (ref 6–20)
BUN: 20 mg/dL (ref 6–20)
BUN: 18 mg/dL (ref 6–20)
BUN: 19 mg/dL (ref 6–20)
BUN: 16 mg/dL (ref 6–20)
BUN: 15 mg/dL (ref 6–20)
CALCIUM ION: 1.09 mmol/L — AB (ref 1.15–1.40)
CHLORIDE: 95 mmol/L — AB (ref 101–111)
CHLORIDE: 100 mmol/L — AB (ref 101–111)
CHLORIDE: 103 mmol/L (ref 101–111)
CHLORIDE: 100 mmol/L — AB (ref 101–111)
CREATININE: 0.9 mg/dL (ref 0.61–1.24)
CREATININE: 0.7 mg/dL (ref 0.61–1.24)
CREATININE: 0.8 mg/dL (ref 0.61–1.24)
CREATININE: 0.9 mg/dL (ref 0.61–1.24)
CREATININE: 0.9 mg/dL (ref 0.61–1.24)
Calcium, Ion: 1.29 mmol/L (ref 1.15–1.40)
Calcium, Ion: 1.23 mmol/L (ref 1.15–1.40)
Calcium, Ion: 1.07 mmol/L — ABNORMAL LOW (ref 1.15–1.40)
Calcium, Ion: 1.08 mmol/L — ABNORMAL LOW (ref 1.15–1.40)
Calcium, Ion: 1.09 mmol/L — ABNORMAL LOW (ref 1.15–1.40)
Calcium, Ion: 1.11 mmol/L — ABNORMAL LOW (ref 1.15–1.40)
Chloride: 103 mmol/L (ref 101–111)
Chloride: 104 mmol/L (ref 101–111)
Chloride: 105 mmol/L (ref 101–111)
Creatinine, Ser: 0.9 mg/dL (ref 0.61–1.24)
Creatinine, Ser: 0.9 mg/dL (ref 0.61–1.24)
GLUCOSE: 114 mg/dL — AB (ref 65–99)
GLUCOSE: 129 mg/dL — AB (ref 65–99)
Glucose, Bld: 110 mg/dL — ABNORMAL HIGH (ref 65–99)
Glucose, Bld: 115 mg/dL — ABNORMAL HIGH (ref 65–99)
Glucose, Bld: 110 mg/dL — ABNORMAL HIGH (ref 65–99)
Glucose, Bld: 156 mg/dL — ABNORMAL HIGH (ref 65–99)
Glucose, Bld: 172 mg/dL — ABNORMAL HIGH (ref 65–99)
HCT: 30 % — ABNORMAL LOW (ref 39.0–52.0)
HCT: 31 % — ABNORMAL LOW (ref 39.0–52.0)
HCT: 32 % — ABNORMAL LOW (ref 39.0–52.0)
HEMATOCRIT: 42 % (ref 39.0–52.0)
HEMATOCRIT: 41 % (ref 39.0–52.0)
HEMATOCRIT: 33 % — AB (ref 39.0–52.0)
HEMATOCRIT: 36 % — AB (ref 39.0–52.0)
HEMOGLOBIN: 14.3 g/dL (ref 13.0–17.0)
HEMOGLOBIN: 13.9 g/dL (ref 13.0–17.0)
HEMOGLOBIN: 10.2 g/dL — AB (ref 13.0–17.0)
HEMOGLOBIN: 12.2 g/dL — AB (ref 13.0–17.0)
Hemoglobin: 11.2 g/dL — ABNORMAL LOW (ref 13.0–17.0)
Hemoglobin: 10.5 g/dL — ABNORMAL LOW (ref 13.0–17.0)
Hemoglobin: 10.9 g/dL — ABNORMAL LOW (ref 13.0–17.0)
POTASSIUM: 4.6 mmol/L (ref 3.5–5.1)
POTASSIUM: 4.9 mmol/L (ref 3.5–5.1)
POTASSIUM: 5.2 mmol/L — AB (ref 3.5–5.1)
Potassium: 4.7 mmol/L (ref 3.5–5.1)
Potassium: 6.5 mmol/L (ref 3.5–5.1)
Potassium: 3.9 mmol/L (ref 3.5–5.1)
Potassium: 3.7 mmol/L (ref 3.5–5.1)
SODIUM: 141 mmol/L (ref 135–145)
Sodium: 141 mmol/L (ref 135–145)
Sodium: 138 mmol/L (ref 135–145)
Sodium: 138 mmol/L (ref 135–145)
Sodium: 132 mmol/L — ABNORMAL LOW (ref 135–145)
Sodium: 134 mmol/L — ABNORMAL LOW (ref 135–145)
Sodium: 137 mmol/L (ref 135–145)
TCO2: 29 mmol/L (ref 0–100)
TCO2: 25 mmol/L (ref 0–100)
TCO2: 28 mmol/L (ref 0–100)
TCO2: 25 mmol/L (ref 0–100)
TCO2: 27 mmol/L (ref 0–100)
TCO2: 24 mmol/L (ref 0–100)
TCO2: 23 mmol/L (ref 0–100)

## 2016-11-11 LAB — POCT I-STAT 4, (NA,K, GLUC, HGB,HCT)
Glucose, Bld: 130 mg/dL — ABNORMAL HIGH (ref 65–99)
HCT: 37 % — ABNORMAL LOW (ref 39.0–52.0)
Hemoglobin: 12.6 g/dL — ABNORMAL LOW (ref 13.0–17.0)
Potassium: 4.1 mmol/L (ref 3.5–5.1)
Sodium: 140 mmol/L (ref 135–145)

## 2016-11-11 LAB — GLUCOSE, CAPILLARY
GLUCOSE-CAPILLARY: 108 mg/dL — AB (ref 65–99)
GLUCOSE-CAPILLARY: 152 mg/dL — AB (ref 65–99)
GLUCOSE-CAPILLARY: 160 mg/dL — AB (ref 65–99)
Glucose-Capillary: 128 mg/dL — ABNORMAL HIGH (ref 65–99)
Glucose-Capillary: 85 mg/dL (ref 65–99)
Glucose-Capillary: 179 mg/dL — ABNORMAL HIGH (ref 65–99)
Glucose-Capillary: 170 mg/dL — ABNORMAL HIGH (ref 65–99)

## 2016-11-11 LAB — PROTIME-INR
INR: 1.38
PROTHROMBIN TIME: 17 s — AB (ref 11.4–15.2)

## 2016-11-11 LAB — CREATININE, SERUM: Creatinine, Ser: 1.18 mg/dL (ref 0.61–1.24)

## 2016-11-11 LAB — HEMOGLOBIN AND HEMATOCRIT, BLOOD
HEMATOCRIT: 31.8 % — AB (ref 39.0–52.0)
HEMOGLOBIN: 10.6 g/dL — AB (ref 13.0–17.0)

## 2016-11-11 LAB — PLATELET COUNT: Platelets: 143 10*3/uL — ABNORMAL LOW (ref 150–400)

## 2016-11-11 LAB — APTT: APTT: 37 s — AB (ref 24–36)

## 2016-11-11 LAB — MAGNESIUM: Magnesium: 3 mg/dL — ABNORMAL HIGH (ref 1.7–2.4)

## 2016-11-11 SURGERY — REPAIR, MITRAL VALVE, MINIMALLY INVASIVE
Anesthesia: General | Site: Chest

## 2016-11-11 MED ORDER — LACTATED RINGERS IV SOLN
INTRAVENOUS | Status: DC | PRN
  Administered 2016-11-11: 07:00:00 via INTRAVENOUS

## 2016-11-11 MED ORDER — ACETAMINOPHEN 160 MG/5ML PO SOLN
1000.0000 mg | Freq: Four times a day (QID) | ORAL | Status: DC
  Administered 2016-11-11: 1000 mg
  Filled 2016-11-11: qty 40.6

## 2016-11-11 MED ORDER — EPINEPHRINE PF 1 MG/ML IJ SOLN
0.0000 ug/min | INTRAVENOUS | Status: DC
  Administered 2016-11-12: 2 ug/min via INTRAVENOUS
  Filled 2016-11-11 (×2): qty 4

## 2016-11-11 MED ORDER — ALBUMIN HUMAN 5 % IV SOLN
250.0000 mL | INTRAVENOUS | Status: AC | PRN
  Administered 2016-11-11 (×3): 250 mL via INTRAVENOUS
  Filled 2016-11-11: qty 250

## 2016-11-11 MED ORDER — SODIUM CHLORIDE 0.9% FLUSH
3.0000 mL | Freq: Two times a day (BID) | INTRAVENOUS | Status: DC
  Administered 2016-11-12: 3 mL via INTRAVENOUS
  Administered 2016-11-13 – 2016-11-14 (×2): 10 mL via INTRAVENOUS
  Administered 2016-11-16 – 2016-11-18 (×3): 3 mL via INTRAVENOUS

## 2016-11-11 MED ORDER — ROCURONIUM BROMIDE 100 MG/10ML IV SOLN
INTRAVENOUS | Status: DC | PRN
  Administered 2016-11-11: 100 mg via INTRAVENOUS
  Administered 2016-11-11: 50 mg via INTRAVENOUS
  Administered 2016-11-11: 20 mg via INTRAVENOUS
  Administered 2016-11-11: 30 mg via INTRAVENOUS
  Administered 2016-11-11 (×2): 50 mg via INTRAVENOUS

## 2016-11-11 MED ORDER — MAGNESIUM SULFATE 4 GM/100ML IV SOLN
4.0000 g | Freq: Once | INTRAVENOUS | Status: AC
  Administered 2016-11-11: 4 g via INTRAVENOUS
  Filled 2016-11-11: qty 100

## 2016-11-11 MED ORDER — SODIUM CHLORIDE 0.9 % IV SOLN
INTRAVENOUS | Status: DC | PRN
  Administered 2016-11-11: 15:00:00 via INTRAVENOUS

## 2016-11-11 MED ORDER — SODIUM CHLORIDE 0.9% FLUSH: 3.0000 mL | INTRAVENOUS | Status: DC | PRN

## 2016-11-11 MED ORDER — MIDAZOLAM HCL 2 MG/2ML IJ SOLN
INTRAMUSCULAR | Status: AC
  Filled 2016-11-11: qty 2

## 2016-11-11 MED ORDER — DOCUSATE SODIUM 100 MG PO CAPS
200.0000 mg | ORAL_CAPSULE | Freq: Every day | ORAL | Status: DC
  Administered 2016-11-12 – 2016-11-19 (×6): 200 mg via ORAL
  Filled 2016-11-11 (×9): qty 2

## 2016-11-11 MED ORDER — DEXMEDETOMIDINE HCL IN NACL 200 MCG/50ML IV SOLN
INTRAVENOUS | Status: AC
  Filled 2016-11-11: qty 50

## 2016-11-11 MED ORDER — SODIUM CHLORIDE 0.9 % IJ SOLN
INTRAMUSCULAR | Status: AC
Start: 2016-11-11 — End: 2016-11-11
  Filled 2016-11-11: qty 10

## 2016-11-11 MED ORDER — LACTATED RINGERS IV SOLN: INTRAVENOUS | Status: DC

## 2016-11-11 MED ORDER — FAMOTIDINE IN NACL 20-0.9 MG/50ML-% IV SOLN
20.0000 mg | Freq: Two times a day (BID) | INTRAVENOUS | Status: AC
  Administered 2016-11-11: 20 mg via INTRAVENOUS

## 2016-11-11 MED ORDER — BUPIVACAINE HCL (PF) 0.5 % IJ SOLN
INTRAMUSCULAR | Status: DC | PRN
  Administered 2016-11-11: 10 mL

## 2016-11-11 MED ORDER — SODIUM CHLORIDE 0.9 % IV SOLN
INTRAVENOUS | Status: DC
  Administered 2016-11-11: 2.8 [IU]/h via INTRAVENOUS
  Filled 2016-11-11 (×2): qty 2.5

## 2016-11-11 MED ORDER — CHLORHEXIDINE GLUCONATE 0.12% ORAL RINSE (MEDLINE KIT)
15.0000 mL | Freq: Two times a day (BID) | OROMUCOSAL | Status: DC
  Administered 2016-11-11 – 2016-11-12 (×3): 15 mL via OROMUCOSAL

## 2016-11-11 MED ORDER — BUPIVACAINE HCL 0.5 % IJ SOLN
INTRAMUSCULAR | Status: AC
  Filled 2016-11-11: qty 1

## 2016-11-11 MED ORDER — NITROGLYCERIN IN D5W 200-5 MCG/ML-% IV SOLN: 0.0000 ug/min | INTRAVENOUS | Status: DC

## 2016-11-11 MED ORDER — BUPIVACAINE 0.5 % ON-Q PUMP SINGLE CATH 400 ML
400.0000 mL | INJECTION | Status: DC
  Filled 2016-11-11: qty 400

## 2016-11-11 MED ORDER — 0.9 % SODIUM CHLORIDE (POUR BTL) OPTIME
TOPICAL | Status: DC | PRN
  Administered 2016-11-11: 6000 mL

## 2016-11-11 MED ORDER — ROCURONIUM BROMIDE 50 MG/5ML IV SOSY
PREFILLED_SYRINGE | INTRAVENOUS | Status: AC
Start: 2016-11-11 — End: 2016-11-11
  Filled 2016-11-11: qty 5

## 2016-11-11 MED ORDER — SODIUM CHLORIDE 0.9 % IV SOLN
30.0000 meq | Freq: Once | INTRAVENOUS | Status: DC
  Filled 2016-11-11: qty 15

## 2016-11-11 MED ORDER — SODIUM CHLORIDE 0.9 % IV SOLN
INTRAVENOUS | Status: DC
  Administered 2016-11-11: 16:00:00 via INTRAVENOUS

## 2016-11-11 MED ORDER — FENTANYL CITRATE (PF) 250 MCG/5ML IJ SOLN
INTRAMUSCULAR | Status: AC
  Filled 2016-11-11: qty 25

## 2016-11-11 MED ORDER — HEPARIN SODIUM (PORCINE) 1000 UNIT/ML IJ SOLN
INTRAMUSCULAR | Status: DC | PRN
  Administered 2016-11-11: 5000 [IU] via INTRAVENOUS
  Administered 2016-11-11: 25000 [IU] via INTRAVENOUS

## 2016-11-11 MED ORDER — MORPHINE SULFATE (PF) 2 MG/ML IV SOLN
1.0000 mg | INTRAVENOUS | Status: DC | PRN
  Administered 2016-11-12 (×2): 1 mg via INTRAVENOUS
  Administered 2016-11-13: 2 mg via INTRAVENOUS
  Filled 2016-11-11: qty 1

## 2016-11-11 MED ORDER — SODIUM CHLORIDE 0.9 % IV SOLN
30.0000 meq | Freq: Once | INTRAVENOUS | Status: AC
  Administered 2016-11-11: 30 meq via INTRAVENOUS
  Filled 2016-11-11: qty 15

## 2016-11-11 MED ORDER — ACETAMINOPHEN 650 MG RE SUPP
650.0000 mg | Freq: Once | RECTAL | Status: AC
  Administered 2016-11-11: 650 mg via RECTAL

## 2016-11-11 MED ORDER — ORAL CARE MOUTH RINSE
15.0000 mL | Freq: Four times a day (QID) | OROMUCOSAL | Status: DC
  Administered 2016-11-11 – 2016-11-14 (×5): 15 mL via OROMUCOSAL

## 2016-11-11 MED ORDER — METOPROLOL TARTRATE 5 MG/5ML IV SOLN: 2.5000 mg | INTRAVENOUS | Status: DC | PRN

## 2016-11-11 MED ORDER — SODIUM CHLORIDE 0.9 % IV SOLN
250.0000 mL | INTRAVENOUS | Status: DC
  Administered 2016-11-12: 250 mL via INTRAVENOUS

## 2016-11-11 MED ORDER — ACETAMINOPHEN 160 MG/5ML PO SOLN: 650.0000 mg | Freq: Once | ORAL | Status: AC

## 2016-11-11 MED ORDER — MIDAZOLAM HCL 10 MG/2ML IJ SOLN
INTRAMUSCULAR | Status: AC
  Filled 2016-11-11: qty 2

## 2016-11-11 MED ORDER — PANTOPRAZOLE SODIUM 40 MG PO TBEC
40.0000 mg | DELAYED_RELEASE_TABLET | Freq: Every day | ORAL | Status: DC
  Administered 2016-11-13 – 2016-11-20 (×8): 40 mg via ORAL
  Filled 2016-11-11 (×8): qty 1

## 2016-11-11 MED ORDER — SODIUM CHLORIDE 0.45 % IV SOLN
INTRAVENOUS | Status: DC | PRN
  Administered 2016-11-11: 16:00:00 via INTRAVENOUS

## 2016-11-11 MED ORDER — BUPIVACAINE 0.5 % ON-Q PUMP SINGLE CATH 400 ML
INJECTION | Status: DC | PRN
  Administered 2016-11-11: 400 mL

## 2016-11-11 MED ORDER — MILRINONE LACTATE IN DEXTROSE 20-5 MG/100ML-% IV SOLN
0.3750 ug/kg/min | INTRAVENOUS | Status: AC
  Administered 2016-11-11: 0.375 ug/kg/min via INTRAVENOUS
  Filled 2016-11-11: qty 100

## 2016-11-11 MED ORDER — PHENYLEPHRINE HCL 10 MG/ML IJ SOLN
0.0000 ug/min | INTRAVENOUS | Status: DC
  Administered 2016-11-11: 15 ug/min via INTRAVENOUS
  Administered 2016-11-12: 35 ug/min via INTRAVENOUS
  Filled 2016-11-11 (×3): qty 2

## 2016-11-11 MED ORDER — PROPOFOL 10 MG/ML IV BOLUS
INTRAVENOUS | Status: AC
  Filled 2016-11-11: qty 20

## 2016-11-11 MED ORDER — TRAMADOL HCL 50 MG PO TABS
50.0000 mg | ORAL_TABLET | ORAL | Status: DC | PRN
  Administered 2016-11-12 – 2016-11-17 (×5): 100 mg via ORAL
  Filled 2016-11-11 (×2): qty 2
  Filled 2016-11-11: qty 1
  Filled 2016-11-11 (×3): qty 2

## 2016-11-11 MED ORDER — MIDAZOLAM HCL 2 MG/2ML IJ SOLN: 2.0000 mg | INTRAMUSCULAR | Status: DC | PRN

## 2016-11-11 MED ORDER — OXYCODONE HCL 5 MG PO TABS
5.0000 mg | ORAL_TABLET | ORAL | Status: DC | PRN
  Administered 2016-11-12 (×5): 10 mg via ORAL
  Administered 2016-11-13: 5 mg via ORAL
  Administered 2016-11-13: 10 mg via ORAL
  Filled 2016-11-11 (×6): qty 2
  Filled 2016-11-11: qty 1
  Filled 2016-11-11: qty 2

## 2016-11-11 MED ORDER — LIDOCAINE 2% (20 MG/ML) 5 ML SYRINGE
INTRAMUSCULAR | Status: DC | PRN
  Administered 2016-11-11: 60 mg via INTRAVENOUS

## 2016-11-11 MED ORDER — BISACODYL 5 MG PO TBEC
10.0000 mg | DELAYED_RELEASE_TABLET | Freq: Every day | ORAL | Status: DC
  Administered 2016-11-12 – 2016-11-15 (×4): 10 mg via ORAL
  Filled 2016-11-11 (×6): qty 2

## 2016-11-11 MED ORDER — MORPHINE SULFATE (PF) 2 MG/ML IV SOLN
1.0000 mg | INTRAVENOUS | Status: DC | PRN
  Administered 2016-11-11 (×2): 2 mg via INTRAVENOUS
  Filled 2016-11-11: qty 1
  Filled 2016-11-11: qty 2

## 2016-11-11 MED ORDER — SODIUM CHLORIDE 0.9 % IR SOLN
Status: DC | PRN
  Administered 2016-11-11: 3000 mL
  Administered 2016-11-11: 1000 mL

## 2016-11-11 MED ORDER — ASPIRIN 81 MG PO CHEW: 324.0000 mg | CHEWABLE_TABLET | Freq: Every day | ORAL | Status: DC

## 2016-11-11 MED ORDER — MILRINONE LACTATE IN DEXTROSE 20-5 MG/100ML-% IV SOLN
0.1000 ug/kg/min | INTRAVENOUS | Status: DC
  Administered 2016-11-11: 0.5 ug/kg/min via INTRAVENOUS
  Administered 2016-11-12: 0.3 ug/kg/min via INTRAVENOUS
  Administered 2016-11-12: 0.5 ug/kg/min via INTRAVENOUS
  Administered 2016-11-13: 0.25 ug/kg/min via INTRAVENOUS
  Administered 2016-11-13: 0.3 ug/kg/min via INTRAVENOUS
  Administered 2016-11-14 (×2): 0.25 ug/kg/min via INTRAVENOUS
  Administered 2016-11-15: 0.2 ug/kg/min via INTRAVENOUS
  Administered 2016-11-16: 0.1 ug/kg/min via INTRAVENOUS
  Filled 2016-11-11 (×9): qty 100

## 2016-11-11 MED ORDER — METHYLPREDNISOLONE SODIUM SUCC 125 MG IJ SOLR
125.0000 mg | Freq: Once | INTRAMUSCULAR | Status: AC
  Administered 2016-11-11: 125 mg via INTRAVENOUS
  Filled 2016-11-11: qty 2

## 2016-11-11 MED ORDER — DEXMEDETOMIDINE HCL IN NACL 200 MCG/50ML IV SOLN
0.0000 ug/kg/h | INTRAVENOUS | Status: DC
  Administered 2016-11-11: 0.3 ug/kg/h via INTRAVENOUS

## 2016-11-11 MED ORDER — INSULIN REGULAR BOLUS VIA INFUSION
0.0000 [IU] | Freq: Three times a day (TID) | INTRAVENOUS | Status: DC
  Filled 2016-11-11: qty 10

## 2016-11-11 MED ORDER — DEXTROSE 5 % IV SOLN
1.5000 g | Freq: Two times a day (BID) | INTRAVENOUS | Status: AC
  Administered 2016-11-11 – 2016-11-13 (×4): 1.5 g via INTRAVENOUS
  Filled 2016-11-11 (×5): qty 1.5

## 2016-11-11 MED ORDER — LACTATED RINGERS IV SOLN: 500.0000 mL | Freq: Once | INTRAVENOUS | Status: DC | PRN

## 2016-11-11 MED ORDER — ASPIRIN EC 325 MG PO TBEC
325.0000 mg | DELAYED_RELEASE_TABLET | Freq: Every day | ORAL | Status: DC
  Administered 2016-11-12: 325 mg via ORAL
  Filled 2016-11-11: qty 1

## 2016-11-11 MED ORDER — MIDAZOLAM HCL 5 MG/5ML IJ SOLN
INTRAMUSCULAR | Status: DC | PRN
Start: 2016-11-11 — End: 2016-11-11
  Administered 2016-11-11 (×2): 2 mg via INTRAVENOUS
  Administered 2016-11-11: 1 mg via INTRAVENOUS
  Administered 2016-11-11: 3 mg via INTRAVENOUS
  Administered 2016-11-11: 2 mg via INTRAVENOUS

## 2016-11-11 MED ORDER — CHLORHEXIDINE GLUCONATE 0.12 % MT SOLN
15.0000 mL | OROMUCOSAL | Status: AC
  Administered 2016-11-11: 15 mL via OROMUCOSAL

## 2016-11-11 MED ORDER — ROCURONIUM BROMIDE 50 MG/5ML IV SOSY
PREFILLED_SYRINGE | INTRAVENOUS | Status: AC
  Filled 2016-11-11: qty 5

## 2016-11-11 MED ORDER — FENTANYL CITRATE (PF) 250 MCG/5ML IJ SOLN
INTRAMUSCULAR | Status: DC | PRN
  Administered 2016-11-11 (×2): 100 ug via INTRAVENOUS
  Administered 2016-11-11: 50 ug via INTRAVENOUS
  Administered 2016-11-11: 250 ug via INTRAVENOUS
  Administered 2016-11-11: 150 ug via INTRAVENOUS
  Administered 2016-11-11: 50 ug via INTRAVENOUS

## 2016-11-11 MED ORDER — EPHEDRINE SULFATE-NACL 50-0.9 MG/10ML-% IV SOSY
PREFILLED_SYRINGE | INTRAVENOUS | Status: DC | PRN
  Administered 2016-11-11: 10 mg via INTRAVENOUS

## 2016-11-11 MED ORDER — ONDANSETRON HCL 4 MG/2ML IJ SOLN
4.0000 mg | Freq: Four times a day (QID) | INTRAMUSCULAR | Status: DC | PRN
  Administered 2016-11-12 (×2): 4 mg via INTRAVENOUS
  Filled 2016-11-11 (×2): qty 2

## 2016-11-11 MED ORDER — PROTAMINE SULFATE 10 MG/ML IV SOLN
INTRAVENOUS | Status: AC
  Filled 2016-11-11: qty 25

## 2016-11-11 MED ORDER — PROTAMINE SULFATE 10 MG/ML IV SOLN
INTRAVENOUS | Status: DC | PRN
  Administered 2016-11-11: 250 mg via INTRAVENOUS

## 2016-11-11 MED ORDER — PROPOFOL 10 MG/ML IV BOLUS
INTRAVENOUS | Status: DC | PRN
  Administered 2016-11-11: 50 mg via INTRAVENOUS

## 2016-11-11 MED ORDER — VANCOMYCIN HCL IN DEXTROSE 1-5 GM/200ML-% IV SOLN
1000.0000 mg | Freq: Once | INTRAVENOUS | Status: AC
  Administered 2016-11-11: 1000 mg via INTRAVENOUS
  Filled 2016-11-11: qty 200

## 2016-11-11 MED ORDER — BISACODYL 10 MG RE SUPP: 10.0000 mg | Freq: Every day | RECTAL | Status: DC

## 2016-11-11 MED ORDER — ACETAMINOPHEN 500 MG PO TABS
1000.0000 mg | ORAL_TABLET | Freq: Four times a day (QID) | ORAL | Status: AC
  Administered 2016-11-12 – 2016-11-16 (×18): 1000 mg via ORAL
  Filled 2016-11-11 (×18): qty 2

## 2016-11-11 MED ORDER — LIDOCAINE 2% (20 MG/ML) 5 ML SYRINGE
INTRAMUSCULAR | Status: AC
  Filled 2016-11-11: qty 5

## 2016-11-11 MED ORDER — HEPARIN SODIUM (PORCINE) 1000 UNIT/ML IJ SOLN
INTRAMUSCULAR | Status: AC
  Filled 2016-11-11: qty 1

## 2016-11-11 MED ORDER — CHLORHEXIDINE GLUCONATE 4 % EX LIQD: 30.0000 mL | CUTANEOUS | Status: DC

## 2016-11-11 MED ORDER — SODIUM BICARBONATE 8.4 % IV SOLN
50.0000 meq | Freq: Once | INTRAVENOUS | Status: AC
  Administered 2016-11-11: 50 meq via INTRAVENOUS

## 2016-11-11 MED ORDER — FENTANYL CITRATE (PF) 250 MCG/5ML IJ SOLN
INTRAMUSCULAR | Status: AC
  Filled 2016-11-11: qty 5

## 2016-11-11 MED FILL — Magnesium Sulfate Inj 50%: INTRAMUSCULAR | Qty: 10 | Status: AC

## 2016-11-11 MED FILL — Heparin Sodium (Porcine) Inj 1000 Unit/ML: INTRAMUSCULAR | Qty: 30 | Status: AC

## 2016-11-11 MED FILL — Potassium Chloride Inj 2 mEq/ML: INTRAVENOUS | Qty: 40 | Status: AC

## 2016-11-11 SURGICAL SUPPLY — 120 items
ADAPTER CARDIO PERF ANTE/RETRO (ADAPTER) ×3 IMPLANT
ARTICLIP LAA PROCLIP II 45 (Clip) ×3 IMPLANT
BAG DECANTER FOR FLEXI CONT (MISCELLANEOUS) ×6 IMPLANT
BLADE SURG 11 STRL SS (BLADE) ×3 IMPLANT
CANISTER SUCTION 2500CC (MISCELLANEOUS) ×6 IMPLANT
CANNULA FEM VENOUS REMOTE 22FR (CANNULA) IMPLANT
CANNULA FEMORAL ART 14 SM (MISCELLANEOUS) ×3 IMPLANT
CANNULA GUNDRY RCSP 15FR (MISCELLANEOUS) ×3 IMPLANT
CANNULA OPTISITE PERFUSION 16F (CANNULA) IMPLANT
CANNULA OPTISITE PERFUSION 18F (CANNULA) ×3 IMPLANT
CANNULA SUMP PERICARDIAL (CANNULA) ×6 IMPLANT
CARDIOBLATE CARDIAC ABLATION (MISCELLANEOUS)
CATH KIT ON Q 5IN SLV (PAIN MANAGEMENT) IMPLANT
CATH KIT ON-Q SILVERSOAK 5IN (CATHETERS) ×3 IMPLANT
CATH ROBINSON RED A/P 18FR (CATHETERS) ×3 IMPLANT
CLAMP OLL ABLATION (MISCELLANEOUS) ×3 IMPLANT
CONN ST 1/4X3/8  BEN (MISCELLANEOUS) ×2
CONN ST 1/4X3/8 BEN (MISCELLANEOUS) ×4 IMPLANT
CONNECTOR 1/2X3/8X1/2 3 WAY (MISCELLANEOUS) ×1
CONNECTOR 1/2X3/8X1/2 3WAY (MISCELLANEOUS) ×2 IMPLANT
CONT SPEC 4OZ CLIKSEAL STRL BL (MISCELLANEOUS) ×6 IMPLANT
CONT SPEC STER OR (MISCELLANEOUS) ×3 IMPLANT
COVER BACK TABLE 24X17X13 BIG (DRAPES) ×3 IMPLANT
CRADLE DONUT ADULT HEAD (MISCELLANEOUS) ×3 IMPLANT
DERMABOND ADVANCED (GAUZE/BANDAGES/DRESSINGS) ×1
DERMABOND ADVANCED .7 DNX12 (GAUZE/BANDAGES/DRESSINGS) ×2 IMPLANT
DEVICE ATRICLIP LAA PRCLPII 45 (Clip) ×2 IMPLANT
DEVICE CARDIOBLATE CARDIAC ABL (MISCELLANEOUS) IMPLANT
DEVICE PMI PUNCTURE CLOSURE (MISCELLANEOUS) ×3 IMPLANT
DEVICE SUT CK QUICK LOAD MINI (Prosthesis & Implant Heart) ×15 IMPLANT
DEVICE TROCAR PUNCTURE CLOSURE (ENDOMECHANICALS) ×3 IMPLANT
DRAIN CHANNEL 28F RND 3/8 FF (WOUND CARE) IMPLANT
DRAIN CHANNEL 32F RND 10.7 FF (WOUND CARE) ×6 IMPLANT
DRAPE BILATERAL SPLIT (DRAPES) ×3 IMPLANT
DRAPE C-ARM 42X72 X-RAY (DRAPES) ×3 IMPLANT
DRAPE CV SPLIT W-CLR ANES SCRN (DRAPES) ×3 IMPLANT
DRAPE INCISE IOBAN 66X45 STRL (DRAPES) ×9 IMPLANT
DRAPE SLUSH/WARMER DISC (DRAPES) ×3 IMPLANT
DRSG COVADERM 4X8 (GAUZE/BANDAGES/DRESSINGS) ×3 IMPLANT
ELECT BLADE 6.5 EXT (BLADE) ×3 IMPLANT
ELECT REM PT RETURN 9FT ADLT (ELECTROSURGICAL) ×6
ELECTRODE REM PT RTRN 9FT ADLT (ELECTROSURGICAL) ×4 IMPLANT
FELT TEFLON 1X6 (MISCELLANEOUS) ×3 IMPLANT
FEMORAL VENOUS CANN RAP (CANNULA) ×3 IMPLANT
FLUID NSS /IRRIG 1000 ML XXX (MISCELLANEOUS) ×3 IMPLANT
FLUID NSS /IRRIG 3000 ML XXX (IV SOLUTION) ×3 IMPLANT
GAUZE SPONGE 4X4 12PLY STRL (GAUZE/BANDAGES/DRESSINGS) IMPLANT
GLOVE BIOGEL M 6.5 STRL (GLOVE) ×9 IMPLANT
GLOVE BIOGEL M STER SZ 6 (GLOVE) ×9 IMPLANT
GLOVE BIOGEL M STRL SZ7.5 (GLOVE) ×6 IMPLANT
GLOVE BIOGEL PI IND STRL 6 (GLOVE) ×4 IMPLANT
GLOVE BIOGEL PI IND STRL 6.5 (GLOVE) ×8 IMPLANT
GLOVE BIOGEL PI INDICATOR 6 (GLOVE) ×2
GLOVE BIOGEL PI INDICATOR 6.5 (GLOVE) ×4
GLOVE ORTHO TXT STRL SZ7.5 (GLOVE) ×9 IMPLANT
GOWN STRL REUS W/ TWL LRG LVL3 (GOWN DISPOSABLE) ×12 IMPLANT
GOWN STRL REUS W/TWL LRG LVL3 (GOWN DISPOSABLE) ×6
KIT BASIN OR (CUSTOM PROCEDURE TRAY) ×3 IMPLANT
KIT DILATOR VASC 18G NDL (KITS) ×3 IMPLANT
KIT DRAINAGE VACCUM ASSIST (KITS) ×3 IMPLANT
KIT ROOM TURNOVER OR (KITS) ×3 IMPLANT
KIT SUCTION CATH 14FR (SUCTIONS) ×3 IMPLANT
KIT SUT CK MINI COMBO 4X17 (Prosthesis & Implant Heart) ×3 IMPLANT
LEAD PACING MYOCARDI (MISCELLANEOUS) ×3 IMPLANT
LINE VENT (MISCELLANEOUS) ×3 IMPLANT
LOOP VESSEL SUPERMAXI WHITE (MISCELLANEOUS) ×3 IMPLANT
NEEDLE AORTIC ROOT 14G 7F (CATHETERS) ×3 IMPLANT
NS IRRIG 1000ML POUR BTL (IV SOLUTION) ×18 IMPLANT
PACK OPEN HEART (CUSTOM PROCEDURE TRAY) ×3 IMPLANT
PAD ARMBOARD 7.5X6 YLW CONV (MISCELLANEOUS) ×6 IMPLANT
PAD ELECT DEFIB RADIOL ZOLL (MISCELLANEOUS) ×3 IMPLANT
PROBE CRYO2-ABLATION MALLABLE (MISCELLANEOUS) ×3 IMPLANT
RING TRICUSPID T28 (Prosthesis & Implant Heart) ×3 IMPLANT
SET CANNULATION TOURNIQUET (MISCELLANEOUS) ×3 IMPLANT
SET CARDIOPLEGIA MPS 5001102 (MISCELLANEOUS) ×3 IMPLANT
SET IRRIG TUBING LAPAROSCOPIC (IRRIGATION / IRRIGATOR) ×3 IMPLANT
SOLUTION ANTI FOG 6CC (MISCELLANEOUS) ×3 IMPLANT
SPONGE GAUZE 4X4 12PLY STER LF (GAUZE/BANDAGES/DRESSINGS) ×6 IMPLANT
SUT BONE WAX W31G (SUTURE) ×3 IMPLANT
SUT E-PACK MINIMALLY INVASIVE (SUTURE) ×3 IMPLANT
SUT ETHIBON 2 0 V 52N 30 (SUTURE) ×3 IMPLANT
SUT ETHIBOND (SUTURE) ×6 IMPLANT
SUT ETHIBOND 2 0 SH (SUTURE) ×12 IMPLANT
SUT ETHIBOND 2 0 V4 (SUTURE) IMPLANT
SUT ETHIBOND 2 0V4 GREEN (SUTURE) IMPLANT
SUT ETHIBOND 2-0 RB-1 WHT (SUTURE) ×6 IMPLANT
SUT ETHIBOND 4 0 RB 1 (SUTURE) ×3 IMPLANT
SUT ETHIBOND 4 0 TF (SUTURE) IMPLANT
SUT ETHIBOND 5 0 C 1 30 (SUTURE) IMPLANT
SUT ETHIBOND NAB MH 2-0 36IN (SUTURE) IMPLANT
SUT ETHIBOND X763 2 0 SH 1 (SUTURE) ×3 IMPLANT
SUT GORETEX 6.0 TH-9 30 IN (SUTURE) IMPLANT
SUT GORETEX CV 4 TH 22 36 (SUTURE) ×3 IMPLANT
SUT GORETEX CV-5THC-13 36IN (SUTURE) IMPLANT
SUT GORETEX CV4 TH-18 (SUTURE) ×6 IMPLANT
SUT GORETEX TH-18 36 INCH (SUTURE) IMPLANT
SUT PROLENE 3 0 SH DA (SUTURE) ×3 IMPLANT
SUT PROLENE 3 0 SH1 36 (SUTURE) ×12 IMPLANT
SUT PROLENE 4 0 RB 1 (SUTURE) ×5
SUT PROLENE 4-0 RB1 .5 CRCL 36 (SUTURE) ×10 IMPLANT
SUT SILK 2 0 SH CR/8 (SUTURE) IMPLANT
SUT SILK 3 0 SH CR/8 (SUTURE) IMPLANT
SUT VIC AB 2-0 CTX 36 (SUTURE) IMPLANT
SUT VIC AB 3-0 SH 8-18 (SUTURE) IMPLANT
SUT VICRYL 2 TP 1 (SUTURE) IMPLANT
SYRINGE 10CC LL (SYRINGE) ×3 IMPLANT
SYSTEM SAHARA CHEST DRAIN ATS (WOUND CARE) ×3 IMPLANT
TAPE CLOTH SURG 4X10 WHT LF (GAUZE/BANDAGES/DRESSINGS) ×6 IMPLANT
TAPE UMBILICAL COTTON 1/8X30 (MISCELLANEOUS) ×3 IMPLANT
TOWEL OR 17X24 6PK STRL BLUE (TOWEL DISPOSABLE) ×6 IMPLANT
TOWEL OR 17X26 10 PK STRL BLUE (TOWEL DISPOSABLE) ×6 IMPLANT
TRAY FOLEY IC TEMP SENS 16FR (CATHETERS) ×3 IMPLANT
TROCAR XCEL BLADELESS 5X75MML (TROCAR) ×3 IMPLANT
TROCAR XCEL NON-BLD 11X100MML (ENDOMECHANICALS) ×6 IMPLANT
TUBE SUCT INTRACARD DLP 20F (MISCELLANEOUS) ×3 IMPLANT
TUNNELER SHEATH ON-Q 11GX8 DSP (PAIN MANAGEMENT) ×3 IMPLANT
UNDERPAD 30X30 (UNDERPADS AND DIAPERS) ×3 IMPLANT
VALVE MITRAL 33MM (Prosthesis & Implant Heart) ×3 IMPLANT
WATER STERILE IRR 1000ML POUR (IV SOLUTION) ×6 IMPLANT
WIRE .035 3MM-J 145CM (WIRE) ×3 IMPLANT

## 2016-11-11 NOTE — Anesthesia Procedure Notes (Signed)
Procedure Name: Intubation Date/Time: 11/11/2016 8:04 AM Performed by: Rush Farmer E Pre-anesthesia Checklist: Patient identified, Emergency Drugs available, Suction available and Patient being monitored Patient Re-evaluated:Patient Re-evaluated prior to inductionOxygen Delivery Method: Circle system utilized Preoxygenation: Pre-oxygenation with 100% oxygen Intubation Type: IV induction Ventilation: Mask ventilation without difficulty Laryngoscope Size: Mac and 4 Grade View: Grade III Tube type: Oral Endobronchial tube: Left, Double lumen EBT, EBT position confirmed by auscultation and EBT position confirmed by fiberoptic bronchoscope and 37 Fr Number of attempts: 2 Airway Equipment and Method: Stylet Placement Confirmation: ETT inserted through vocal cords under direct vision,  positive ETCO2 and breath sounds checked- equal and bilateral Tube secured with: Tape Dental Injury: Teeth and Oropharynx as per pre-operative assessment

## 2016-11-11 NOTE — OR Nursing (Signed)
1523 Rolling call made to SICU.

## 2016-11-11 NOTE — Progress Notes (Signed)
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  Check repeat ABG and wean FiO2 as tolerated.  Empiric steroids  Purcell Nails, MD 11/11/2016 6:11 PM

## 2016-11-11 NOTE — Anesthesia Preprocedure Evaluation (Signed)
Anesthesia Evaluation  Patient identified by MRN, date of birth, ID band Patient awake    Reviewed: Allergy & Precautions, NPO status   Airway Mallampati: II  TM Distance: <3 FB Neck ROM: Full    Dental  (+) Teeth Intact   Pulmonary shortness of breath, Current Smoker, former smoker,    breath sounds clear to auscultation       Cardiovascular + angina +CHF  + dysrhythmias Atrial Fibrillation + Valvular Problems/Murmurs MR  Rhythm:Irregular Rate:Normal  History noted. CE   Neuro/Psych negative neurological ROS  negative psych ROS   GI/Hepatic negative GI ROS, Neg liver ROS,   Endo/Other  negative endocrine ROS  Renal/GU negative Renal ROS     Musculoskeletal  (+) Arthritis ,   Abdominal   Peds  Hematology   Anesthesia Other Findings   Reproductive/Obstetrics                             Anesthesia Physical Anesthesia Plan  ASA: IV  Anesthesia Plan: General   Post-op Pain Management:    Induction: Intravenous  Airway Management Planned: Double Lumen EBT  Additional Equipment: Arterial line, TEE, CVP, PA Cath and Ultrasound Guidance Line Placement  Intra-op Plan:   Post-operative Plan: Post-operative intubation/ventilation  Informed Consent: I have reviewed the patients History and Physical, chart, labs and discussed the procedure including the risks, benefits and alternatives for the proposed anesthesia with the patient or authorized representative who has indicated his/her understanding and acceptance.   Dental advisory given  Plan Discussed with: Surgeon  Anesthesia Plan Comments:         Anesthesia Quick Evaluation

## 2016-11-11 NOTE — Interval H&P Note (Signed)
History and Physical Interval Note:  11/11/2016 7:32 AM  [name] [name]  has presented today for surgery, with the diagnosis of MR AFIB  The various methods of treatment have been discussed with the patient and family. After consideration of risks, benefits and other options for treatment, the patient has consented to  Procedure(s): MINIMALLY INVASIVE MITRAL VALVE REPAIR OR REPLACEMENT (MVR) (Right) MINIMALLY INVASIVE MAZE PROCEDURE (N/A) TRANSESOPHAGEAL ECHOCARDIOGRAM (TEE) (N/A) as a surgical intervention .  The patient's history has been reviewed, patient examined, no change in status, stable for surgery.  I have reviewed the patient's chart and labs.  Questions were answered to the patient's satisfaction.     Purcell Nails

## 2016-11-11 NOTE — H&P (View-Only) (Signed)
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 anesthetized with 1% lidocaine. Using the modified Seldinger technique a 6 Fr slender sheath was placed in the right radial artery and a 5 French sheath was placed in the right brachial vein. A Swan-Ganz catheter was used for the right heart catheterization. Standard protocol was followed for recording of right heart pressures and sampling of oxygen saturations. Fick cardiac output was calculated. Standard Judkins catheters were used for selective coronary angiography and left ventriculography. There were no immediate procedural complications. The patient was transferred to the post catheterization recovery area  for further monitoring.  Contrast: 85 cc   Estimated blood loss <50 mL.  During this procedure the patient was administered the following to achieve and maintain moderate conscious sedation: Versed 1 mg, Fentanyl 25 mcg, while the patient's heart rate, blood pressure, and oxygen saturation were continuously monitored. The period of conscious sedation was 85 minutes, of which I was present face-to-face 100% of this time.    Complications   Complications documented before study signed (10/20/2016 10:47 AM EST)    No complications were associated with this study.  Documented by Peter M [place], MD - 10/20/2016 10:46 AM EST    Coronary Findings   Dominance: Right  Left Main  Vessel was injected. Vessel is normal in caliber. Vessel is angiographically normal.  Left Anterior Descending  Vessel was injected. Vessel is normal in caliber. Vessel is angiographically normal.  Ramus Intermedius  Vessel was injected. Vessel is normal in caliber. Vessel is angiographically normal.  Left Circumflex  Vessel was injected. Vessel is normal in caliber. Vessel is angiographically normal.  Right Coronary Artery  Vessel was injected. Vessel is normal in caliber. Vessel is angiographically normal.  Right Heart   Right Heart Pressures LV EDP is normal. Normal right heart pressures.    Right Atrium The right atrium is dilated.    Wall Motion              Left Heart   Left Ventricle The left ventricle is moderately dilated. There is severe left ventricular systolic dysfunction. LV end diastolic pressure is normal. The left ventricular ejection fraction is 25-35% by visual estimate. There are LV function abnormalities due to global hypokinesis. There is severe (4+) mitral regurgitation.    Coronary Diagrams   Diagnostic Diagram     Implants     No implant documentation for this case.  PACS Images   Show images for Cardiac catheterization   Link to Procedure Log   Procedure Log     Hemo Data   Flowsheet Row Most Recent Value  Fick Cardiac Output 2.8 L/min  Fick Cardiac Output Index 1.48 (L/min)/BSA  RA A Wave 5 mmHg  RA V Wave 6 mmHg  RA Mean 4 mmHg  RV Systolic Pressure 33 mmHg  RV Diastolic Pressure 0 mmHg  RV EDP 2 mmHg  PA Systolic Pressure 35 mmHg  PA Diastolic Pressure 12 mmHg  PA Mean 21 mmHg  PW A Wave 18 mmHg  PW V Wave 18 mmHg  PW Mean 15 mmHg  AO Systolic Pressure 92 mmHg  AO Diastolic Pressure 55 mmHg  AO Mean 69 mmHg  LV Systolic Pressure 84 mmHg  LV Diastolic Pressure 2 mmHg  LV EDP 6 mmHg  Arterial Occlusion Pressure Extended Systolic Pressure 91 mmHg  Arterial Occlusion Pressure Extended Diastolic Pressure 58 mmHg  Arterial Occlusion Pressure Extended Mean Pressure 71 mmHg  Left Ventricular Apex Extended Systolic Pressure 88 mmHg  Left Ventricular Apex Extended Diastolic Pressure 3 mmHg  Left Ventricular Apex Extended EDP Pressure 12 mmHg  QP/QS 1  TPVR Index 14.16 HRUI  TSVR Index 46.52 HRUI  PVR SVR Ratio 0.09  TPVR/TSVR Ratio 0.3     Impression:  Patient has stage D severe symptomatic mitral regurgitation and persistent atrial fibrillation, presumably of recent onset.  I have personally reviewed the patient's recent transthoracic and transesophageal echocardiograms and diagnostic heart catheterization. He has at least moderate global left ventricular systolic dysfunction. There is severe mitral regurgitation with a broad central jet that fills the entire left atrium. There was flow reversal in the pulmonary veins. There is mild thickening of the free margins of both the anterior and posterior mitral valve leaflets and there may be some fixed restriction of leaflet mobility consistent with type IIIA dysfunction, although overall both leaflets seem to move and it is also possible that the patient has primarily type I dysfunction.  There is severe left atrial enlargement. Some of the anatomical findings are consistent with likely  rheumatic disease, and this would make sense given the patient's reported history of long-standing presence of a heart murmur. 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 In either case the patient would likely best be treated with surgical intervention. There remains a possibility that his mitral valve may be repairable, although the patient may require mitral valve replacement if there is extensive fibrosis and scarring of the valve leaflets or subvalvular apparatus. Finally, I feel the patient might benefit from concomitant maze procedure, and he may be a good candidate for minimally invasive approach.   Plan:  The patient and his wife were counseled at length regarding the indications, risks and potential benefits of mitral valve repair or replacement and maze procedure.  The rationale for elective surgery has been explained, including a comparison between surgery and continued medical therapy with close follow-up.  The likelihood of successful and durable valve repair has been discussed with particular reference to the findings of their recent echocardiogram.  Based upon these findings and previous experience, I have quoted them a less than 50 percent likelihood of successful valve repair.  In the event that his valve cannot be successfully repaired, we discussed the possibility of replacing the mitral valve using a mechanical prosthesis with the attendant need for long-term anticoagulation versus the alternative of replacing it using a bioprosthetic tissue valve with its potential for late structural valve deterioration and failure, depending upon the patient's longevity.  The patient specifically requests that if the mitral valve must be replaced that  it be done using a mechanical valve.   The patient understands and accepts all potential risks of surgery including but not  limited to risk of death, stroke or other neurologic complication, myocardial infarction, congestive heart failure, respiratory failure, renal failure, bleeding requiring transfusion and/or reexploration, arrhythmia, infection or other wound complications, pneumonia, pleural and/or pericardial effusion, pulmonary embolus, aortic dissection or other major vascular complication, or delayed complications related to valve repair or replacement including but not limited to structural valve deterioration and failure, thrombosis, embolization, endocarditis, or paravalvular leak.  Alternative surgical approaches have been discussed including a comparison between conventional sternotomy and minimally-invasive techniques.  The relative risks and benefits of each have been reviewed as they pertain to the patient's specific circumstances, and all of their questions have been addressed.  Specific risks potentially related to the minimally-invasive approach were discussed at length, including but not limited to risk of conversion to full or partial sternotomy, aortic dissection or other major vascular complication, unilateral acute lung injury or pulmonary edema, phrenic nerve dysfunction or paralysis, rib fracture, chronic pain, lung hernia, or lymphocele.   All of their questions have been answered.  We tentatively plan to proceed with surgery on 11/11/2016. Prior to surgery we will obtain a CT angiogram of the aorta and iliac vessels to evaluate the feasibility of peripheral cannulation for surgery. The patient has been instructed to stop taking Eliquis 1 week prior to surgery. He will remain on all other medications without change through the night before surgery.  On the morning of surgery he will take only metoprolol with a sip of water.   I spent in excess of 90 minutes during the conduct of this  office consultation and >50% of this time involved direct face-to-face encounter with the patient for counseling and/or coordination of their care.   Salvatore Decent. Cornelius Moras, MD 10/25/2016 3:55 PM

## 2016-11-11 NOTE — Op Note (Signed)
CARDIOTHORACIC SURGERY OPERATIVE NOTE  Date of Procedure:   11/11/2016  Preoperative Diagnosis:    Severe Mitral Regurgitation  Recurrent Persistent Atrial Fibrillation  Dilated Non-ischemic Cardiomyopathy  Postoperative Diagnosis:   Severe Mitral Regurgitation  Recurrent Persistent Atrial Fibrillation  Dilated Non-ischemic Cardiomyopathy  Moderate-severe Tricuspid Regurgitation  Procedure:    Minimally-Invasive Mitral Valve Replacement  Sorin Carbomedics Optiform bileaflet mechanical valve (size 33mm, catalog #[id number], serial #[id number])   Minimally-Invasive Tricuspid Valve Repair  Edwards mc3 ring annuloplasty (size 28 mm, model #4900, serial #[id number])   Minimally-Invasive Maze Procedure  Complete bilateral atrial lesion set using cryothermy and bipolar radiofrequency ablation  Clipping of Left Atrial Appendage (Atricure Pro 245 left atrial clip, size 45 mm)  Surgeon: Salvatore Decent. 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  The patient states that he was told that he has had a heart murmur for nearly all of his life but he has never previously been referred to a cardiologist or added an echocardiogram performed. He denies any known history of rheumatic fever or  scarlet fever. He states that he was in his usual state of health until approximately 2 months ago when he developed a viral illness manifest as low-grade fevers, chills, malaise, and a productive cough. He was treated with expectorant's and a short course of antibiotics and symptoms initially improved. Within a couple of weeks he began to experience progressive shortness of breath. Symptoms progressed fairly rapidly, ultimately prompting him to present to his primary care physician 10/02/2016 with symptoms of resting shortness breath and orthopnea. He was found to be in rapid atrial fibrillation and admitted directly to the hospital. Troponins were negative but BNP level was elevated. Transthoracic echocardiogram performed 10/03/2016 revealed moderate left ventricular systolic dysfunction with ejection fraction estimated 40-45%. There was moderate to severe mitral regurgitation, severe left atrial enlargement, mild right ventricular systolic dysfunction, severe right atrial enlargement, and mild tricuspid regurgitation.  Chest x-ray revealed mild pulmonary edema.  He was anticoagulated and underwent attempted TEE guided cardioversion 10/04/2016. The patient did not maintain sinus rhythm but TEE confirmed the presence of severe mitral regurgitation with moderate thickening of the mitral valve leaflets with somewhat restricted leaflet mobility, suggestive of underlying rheumatic disease.  Heart failure symptoms improved with diuresis and rate control. He was anticoagulated using Eliquis and discharged home. He underwent left and right heart catheterization on 10/20/2016. He was found to have mild nonobstructive coronary artery disease severe global left ventricular systolic dysfunction with ejection fraction estimated 30-35%, and severe mitral regurgitation.  There was only mild pulmonary hypertension with PA pressures measured 35/12 and mean central venous pressure 4 mmHg.  He was referred for surgical consultation.   The patient has been seen in consultation and counseled at length regarding the indications, risks and potential benefits of surgery.  All  questions have been answered, and the patient provides full informed consent for the operation as described.    DETAILS OF THE OPERATIVE PROCEDURE  Preparation:  The patient is brought to the operating room on the above mentioned date and central monitoring was established by the anesthesia team including placement of Swan-Ganz catheter through the left internal jugular vein.  A radial arterial line is placed. The patient is placed in the supine position on the operating table.  Intravenous antibiotics are administered. General endotracheal anesthesia is induced uneventfully. The patient is initially intubated using a dual lumen endotracheal tube.  A Foley catheter is placed.  Baseline transesophageal echocardiogram was performed.  Findings were notable for dilated cardiomyopathy with severe left ventricular systolic dysfunction. There were no regional wall motion abnormalities. The left ventricle was globally enlarged. There was severe central mitral regurgitation. There was no mitral valve prolapse. There was severe enlargement of the mitral annulus. There was moderate calcification of the posterior mitral annulus. Both the anterior and posterior leaflets of the mitral valve moved reasonably well. There was mild thickening and sclerosis of the anterior leaflet. There was no mitral stenosis. There was severe left and right atrial enlargement. There was right ventricular chamber enlargement. There was moderate to severe tricuspid regurgitation. The tricuspid leaflets appeared normal.  A soft roll is placed behind the patient's left scapula and the neck gently extended and turned to the left.   The patient's right neck, chest, abdomen, both groins, and both lower extremities are prepared and draped in a sterile manner. A time out procedure is performed.   Surgical  Approach:  A right miniature anterolateral thoracotomy incision is performed. The incision is placed just lateral to and superior to the right nipple. The pectoralis major muscle is retracted medially and completely preserved. The right pleural space is entered through the 3rd intercostal space. A soft tissue retractor is placed.  Two 11 mm ports are placed through separate stab incisions inferiorly. The right pleural space is insufflated continuously with carbon dioxide gas through the posterior port during the remainder of the operation.  A longitudinal incision is made in the pericardium 3 cm anterior to the phrenic nerve and silk traction sutures are placed on either side of the incision for exposure.   Extracorporeal Cardiopulmonary Bypass:  A small incision is made in the right inguinal crease and the anterior surface of the right common femoral artery and right common femoral vein are identified.  The patient is placed in Trendelenburg position. The right internal jugular vein is cannulated with Seldinger technique and a guidewire advanced into the right atrium. The patient is heparinized systemically. The right internal jugular vein is cannulated with a 14 [place] pediatric femoral venous cannula. Pursestring sutures are placed on the anterior surface of the right common femoral vein and right common femoral artery. The right common femoral vein is cannulated with the Seldinger technique and a guidewire is advanced under transesophageal echocardiogram guidance through the right atrium. The femoral vein is cannulated with a long 22 French femoral venous cannula. The right common femoral artery is cannulated with Seldinger technique and a flexible guidewire is advanced until it can be appreciated intraluminally in the descending thoracic aorta on transesophageal echocardiogram. The femoral artery is cannulated with an 18 French femoral arterial cannula.  Adequate heparinization is verified.      The  entire pre-bypass portion of the operation was notable for stable hemodynamics.  Cardiopulmonary bypass was begun.  Vacuum assist venous drainage is utilized.  The incision in the pericardium is extended in both directions. Venous drainage and exposure are notably excellent.    Clipping of Left Atrial Appendage:  The left atrial appendage is obliterated using an Atricure left atrial appendage clip (Atriclip Pro245, size 45 mm).  The clip is applied under thoracoscopic visualization posterior to the aorta and pulmonary artery through the oblique sinus.  The clip was applied prior to application of the aortic crossclamp, with transesophageal echocardiographic confirmation that the clip satisfactorily obliterates the appendage.   Myocardial Protection  A retrograde cardioplegia cannula is placed through the right atrium into the coronary sinus using transesophageal echocardiogram guidance.  An antegrade cardioplegia cannula is placed in the ascending aorta.  The patient is cooled to 28C systemic temperature.  The aortic cross clamp is applied and cold blood cardioplegia is delivered initially in an antegrade fashion through the aortic root.   Supplemental cardioplegia is given retrograde through the coronary sinus catheter. The initial cardioplegic arrest is rapid with early diastolic arrest.  Repeat doses of cardioplegia are administered intermittently every 20 to 30 minutes throughout the entire cross clamp portion of the operation through the aortic root and through the coronary sinus catheter in order to maintain completely flat electrocardiogram.  Myocardial protection was felt to be excellent.   Maze Procedure (left atrial lesion set):  The left atrial appendage is obliterated using an Atricure left atrial appendage clip (Atriclip Pro245, size 45 mm).  The clip is applied under thoracoscopic visualization posterior to the aorta and pulmonary artery through the oblique sinus.  The clip was applied  prior to application of the aortic crossclamp, with transesophageal echocardiographic confirmation that the clip satisfactorily obliterates the appendage.  Following placement of the aortic crossclamp and the administration of the initial arresting dose of cardioplegia, a left atriotomy incision was performed through the interatrial groove and extended partially across the back wall of the left atrium after opening the oblique sinus inferiorly.  The mitral valve and floor of the left atrium are exposed using a self-retaining retractor.    The AtriCure Synergy bipolar radiofrequency ablation clamp is utilized to create a series of linear lesions in the left atrium, each with one limb of the clamp along the endocardial surface and the other along the epicardial surface. Initially the right-sided pulmonary veins are encircled and a bipolar lesion is created around the base of the right sided pulmonary veins. Next a lesion is placed across the back wall of the left atrium. Third a lesion is created across the dome of left atrium. Fourth a lesion is placed extending from the inferior aspect of the atriotomy incision towards the mitral annulus. The Atricure CryoICE nitrous oxide cryothermy system is utilized for all cryothermy ablation lesions.  The left atrial lesion set of the Cox cryomaze procedure is now performed using 3 minute duration for all cryothermy lesions.  Initially a lesion is placed along the endocardial surface of the left atrium from the caudad apex of the atriotomy incision across the posterior wall of the left atrium onto the posterior mitral annulus.  A mirror image lesion along the epicardial surface is then performed with the probe posterior to the left atrium, crossing over the coronary sinus.  Two lesions are then performed to create a box isolating all of the pulmonary veins from the remainder of the left atrium.  The first lesion is placed from the cephalad apex of the atriotomy incision  across the dome of the left atrium to just anterior to  the left sided pulmonary veins.  The second lesion completes the box from the caudad apex of the atriotomy incision across the back wall of the left atrium to connect with the previous lesion just anterior to the left sided pulmonary veins.     Mitral Valve Replacement:  The mitral valve was inspected and notable for classical rheumatic features with chordal fusion, thickening, and foreshortening of many of the primary cords to both the anterior and the posterior leaflet. There is no mitral stenosis. There is moderate fibrosis and scarring of the anterior leaflet and mild scarring of the posterior leaflet. There is moderate calcification in the posterior annulus. There is severe left atrial enlargement and annular dilatation. Although mitral valve repair is felt to be technically possible, this would require patch augmentation of both the anterior and the posterior leaflets. Given the patient's degree of left ventricular systolic dysfunction and the potential for attenuated durability of mitral valve repair given the patient's rheumatic disease, a decision is made to proceed with valve replacement.   Interrupted 2-0 Ethibond horizontal mattress sutures are placed circumferentially around the entire mitral valve annulus. The sutures will ultimately be utilized for ring annuloplasty, and at this juncture there are utilized to suspend the valve symmetrically.  The majority of the anterior leaflet of the mitral valve is excised sharply. A small portion of the free margin of the anterior leaflet is preserved with associated segments of chordae tendineae from both the anterior and the posterior papillary muscle. Each are incorporated laterally into the suture line. The posterior leaflet is split in the midline and debulked, thereby preserving subvalvular apparatus to both the anterior and the posterior leaflet. The mitral annulus is sized and notably quite  large.  Mitral valve replacement is performed using interrupted 2-0 Ethibond horizontal mattress pledgeted sutures with pledgets in the supra-annular position.  A Sorin Carbomedics Optiform bileaflet mechanical prosthetic valve (size 33 mm, catalog # [id number], serial # [id number]) is implanted uneventfully.  All valve sutures were secured using a Cor-knot device.  The valve is tested to make sure that both leaflets opened and closed normally and are free of impingement from the remaining subvalvular apparatus.  The left atriotomy was closed using a 2-layer closure of running 3-0 Prolene suture after placing a sump drain across the mitral valve to serve as a left ventricular vent.     Maze Procedure (right atrial lesion set):  The inferior vena cava cannula was pulled down until the tip was just below the junction between the right atrium and the inferior vena cava. An oblique incision is made in the right atrium. Traction sutures are placed to facilitate exposure of the tricuspid valve.   The AtriCure Synergy bipolar radiofrequency ablation clamp is utilized to create a series of linear lesions in the right atrium, each with one limb of the clamp along the endocardial surface and the other along the epicardial surface. The first lesion is placed from the posterior apex of the atriotomy incision and along the lateral wall of the right atrium to reach the lateral aspect of the superior vena cava. A second lesion is placed in the opposite direction from the posterior apex of the atriotomy incision along the lateral wall to reach the lateral aspect of the inferior vena cava. A third lesion is placed from the midportion of the atriotomy incision extending at a right angle to reach the tip of the right atrial appendage. A fourth lesion is placed from the anterior apex of the atriotomy  incision in an anterior and inferior direction to reach the acute margin of the heart. Finally, the cryotherapy probe is utilized to  complete the right atrial lesion set by placing the probe along the endocardial surface of the right atrium from the anterior apex of the atriotomy incision to reach the tricuspid annulus at the 2:00 position.    Tricuspid Valve Repair:  The tricuspid valve is inspected carefully. The tricuspid valve leaflets appear normal with normal mobility and no sign of any fibrosis or thickening. Ring annuloplasty is performed using interrupted 2-0 Ethibond horizontal mattress sutures placed circumferentially around the tricuspid annulus with exception of the area immediately below the triangle of Koch. The valve was sized to accept a 28 mm annuloplasty ring based upon the overall surface area of the combined anterior and posterior leaflets.One final dose of warm "hot shot" cardioplegia was administered through the aortic root.  The aortic cross clamp was removed after a total cross clamp time of 141 minutes.  An [facility] 3 annuloplasty ring (size 28 mm, model #4900, serial #[id number]) is implanted uneventfully. All sutures were secured using a Cor-knot device.  After completion of the annuloplasty the valve was tested with saline and appears to be competent. The right atriotomy incision is closed using a 2 layer closure of running 4-0 Prolene suture.   Procedure Completion:  Epicardial pacing wires are fixed to the inferior wall of the right ventricule and to the right atrial appendage. The patient is rewarmed to 37C temperature. The left ventricular vent is removed. The patient is weaned and disconnected from cardiopulmonary bypass.  The patient's rhythm at separation from bypass was junctional.  AV pacing was utilized.  The patient was weaned from bypass on milrinone and low dose epinephrine infusions.  Total cardiopulmonary bypass time for the operation was 218 minutes.  Followup transesophageal echocardiogram performed after separation from bypass revealed a well-seated bileaflet mechanical prosthetic valve  in the mitral position. The valve is functioning normally. There was no paravalvular leak. There was a well-seated annuloplasty ring in the tricuspid position. The tricuspid valve is functioning normally. There was no residual leak.  Left and right ventricular function were unchanged from preoperatively.  The femoral arterial and venous cannulae were removed uneventfully. There was a palpable pulse in the distal right common femoral artery after removal of the cannula. Protamine was administered to reverse the anticoagulation. The right internal jugular cannula was removed and manual pressure held on the neck for 15 minutes.  Single lung ventilation was begun. The atriotomy closure was inspected for hemostasis. The pericardial sac was drained using a 28 French Bard drain placed through the anterior port incision.  The pericardium was closed using a patch of core matrix bovine submucosal tissue patch. The right pleural space is irrigated with saline solution and inspected for hemostasis. The right pleural space was drained using a 28 French Bard drain placed through the posterior port incision. The miniature thoracotomy incision was closed in multiple layers in routine fashion. The right groin incision was inspected for hemostasis and closed in multiple layers in routine fashion.  The post-bypass portion of the operation was notable for stable rhythm and hemodynamics.  No blood products were administered during the operation.   Disposition:  The patient tolerated the procedure well.  The patient was reintubated using a single lumen endotracheal tube and subsequently transported to the surgical intensive care unit in stable condition. There were no intraoperative complications. All sponge instrument and needle counts are verified correct  at completion of the operation.    Salvatore Decent. Cornelius Moras MD 11/11/2016 3:07 PM

## 2016-11-11 NOTE — OR Nursing (Signed)
1418 First call made to SICU.

## 2016-11-11 NOTE — Transfer of Care (Signed)
Immediate Anesthesia Transfer of Care Note  Patient: [name] [name]  Procedure(s) Performed: Procedure(s): MINIMALLY INVASIVE MITRAL VALVE REPLACEMENT (MVR) WITH 33 MM CARBOMEDICS OPTIFORM MECHANICAL VALVE (N/A) MINIMALLY INVASIVE MAZE PROCEDURE WITH APPLICATION OF ATRICLIP PRO 2 45 ON LAA (N/A) TRANSESOPHAGEAL ECHOCARDIOGRAM (TEE) (N/A) TRICUSPID VALVE REPAIR WITH EDWARDS MC3 TRICUSPID 28 MM ANNULOPLASTY RING MODEL 4900 (N/A)  Patient Location: SICU  Anesthesia Type:General  Level of Consciousness: sedated and Patient remains intubated per anesthesia plan  Airway & Oxygen Therapy: Patient remains intubated per anesthesia plan and Patient placed on Ventilator (see vital sign flow sheet for setting)  Post-op Assessment: Report given to RN and Post -op Vital signs reviewed and stable  Post vital signs: Reviewed and stable  Last Vitals:  Vitals:   11/11/16 0555  BP: (!) 104/55  Pulse: 77  Resp: 18  Temp: 36.3 C    Last Pain:  Vitals:   11/11/16 0555  TempSrc: Oral      Patients Stated Pain Goal: 2 (11/11/16 0549)  Complications: No apparent anesthesia complications

## 2016-11-11 NOTE — Brief Op Note (Addendum)
11/11/2016  1:35 PM  PATIENT:  [name] [name]  61 y.o. 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  Cryo: Yes  Cryo Lesions (select all that apply):        2   Box Lesion,     4  Posterior Mirtal Annular Line,     6  Mitral Valve Cryo Lesion,     9   Intercaval Line to Tricuspid Annulus ("T" Lesion) and    10  Tricuspid Cryo Lesion, Medial   16  Other - epicardial posterior AV groove and coronary sinus    Radiofrequency:  Yes.  Bipolar: Yes.  RF Lesions (select all that apply):     9   Intercaval Line to Tricuspid Annulus ("T" Lesion),    11  Intercaval Line and   15b  RAA Lateral Wall to "T" Lesion     Left Atrial Appendage Treatment:    Yes -  epicardial clip   Purcell Nails, MD 11/11/2016 2:58 PM

## 2016-11-12 ENCOUNTER — Encounter (HOSPITAL_COMMUNITY): Payer: Self-pay | Admitting: Thoracic Surgery (Cardiothoracic Vascular Surgery)

## 2016-11-12 ENCOUNTER — Inpatient Hospital Stay (HOSPITAL_COMMUNITY): Payer: BLUE CROSS/BLUE SHIELD

## 2016-11-12 LAB — POCT I-STAT 3, ART BLOOD GAS (G3+)
ACID-BASE DEFICIT: 10 mmol/L — AB (ref 0.0–2.0)
ACID-BASE DEFICIT: 5 mmol/L — AB (ref 0.0–2.0)
Acid-base deficit: 5 mmol/L — ABNORMAL HIGH (ref 0.0–2.0)
Acid-base deficit: 6 mmol/L — ABNORMAL HIGH (ref 0.0–2.0)
BICARBONATE: 17 mmol/L — AB (ref 20.0–28.0)
BICARBONATE: 20.3 mmol/L (ref 20.0–28.0)
BICARBONATE: 21 mmol/L (ref 20.0–28.0)
Bicarbonate: 21.4 mmol/L (ref 20.0–28.0)
O2 SAT: 91 %
O2 SAT: 97 %
O2 Saturation: 95 %
O2 Saturation: 96 %
PCO2 ART: 41.6 mmHg (ref 32.0–48.0)
PCO2 ART: 42.4 mmHg (ref 32.0–48.0)
PH ART: 7.332 — AB (ref 7.350–7.450)
PO2 ART: 102 mmHg (ref 83.0–108.0)
PO2 ART: 67 mmHg — AB (ref 83.0–108.0)
PO2 ART: 87 mmHg (ref 83.0–108.0)
Patient temperature: 36.8
Patient temperature: 36.6
Patient temperature: 36.3
Patient temperature: 35.9
TCO2: 18 mmol/L (ref 0–100)
TCO2: 22 mmol/L (ref 0–100)
TCO2: 22 mmol/L (ref 0–100)
TCO2: 23 mmol/L (ref 0–100)
pCO2 arterial: 40.1 mmHg (ref 32.0–48.0)
pCO2 arterial: 39.8 mmHg (ref 32.0–48.0)
pH, Arterial: 7.235 — ABNORMAL LOW (ref 7.350–7.450)
pH, Arterial: 7.308 — ABNORMAL LOW (ref 7.350–7.450)
pH, Arterial: 7.285 — ABNORMAL LOW (ref 7.350–7.450)
pO2, Arterial: 84 mmHg (ref 83.0–108.0)

## 2016-11-12 LAB — CBC
HCT: 37 % — ABNORMAL LOW (ref 39.0–52.0)
HEMATOCRIT: 38 % — AB (ref 39.0–52.0)
HEMOGLOBIN: 12.3 g/dL — AB (ref 13.0–17.0)
HEMOGLOBIN: 12.2 g/dL — AB (ref 13.0–17.0)
MCH: 29.9 pg (ref 26.0–34.0)
MCH: 30.6 pg (ref 26.0–34.0)
MCHC: 32.4 g/dL (ref 30.0–36.0)
MCHC: 33 g/dL (ref 30.0–36.0)
MCV: 92.2 fL (ref 78.0–100.0)
MCV: 92.7 fL (ref 78.0–100.0)
Platelets: 182 10*3/uL (ref 150–400)
Platelets: 139 10*3/uL — ABNORMAL LOW (ref 150–400)
RBC: 4.12 MIL/uL — ABNORMAL LOW (ref 4.22–5.81)
RBC: 3.99 MIL/uL — ABNORMAL LOW (ref 4.22–5.81)
RDW: 15.3 % (ref 11.5–15.5)
RDW: 15.3 % (ref 11.5–15.5)
WBC: 23.3 10*3/uL — ABNORMAL HIGH (ref 4.0–10.5)
WBC: 26 10*3/uL — AB (ref 4.0–10.5)

## 2016-11-12 LAB — GLUCOSE, CAPILLARY
GLUCOSE-CAPILLARY: 157 mg/dL — AB (ref 65–99)
GLUCOSE-CAPILLARY: 120 mg/dL — AB (ref 65–99)
GLUCOSE-CAPILLARY: 83 mg/dL (ref 65–99)
Glucose-Capillary: 113 mg/dL — ABNORMAL HIGH (ref 65–99)
Glucose-Capillary: 118 mg/dL — ABNORMAL HIGH (ref 65–99)
Glucose-Capillary: 119 mg/dL — ABNORMAL HIGH (ref 65–99)
Glucose-Capillary: 128 mg/dL — ABNORMAL HIGH (ref 65–99)
Glucose-Capillary: 136 mg/dL — ABNORMAL HIGH (ref 65–99)
Glucose-Capillary: 153 mg/dL — ABNORMAL HIGH (ref 65–99)
Glucose-Capillary: 157 mg/dL — ABNORMAL HIGH (ref 65–99)
Glucose-Capillary: 169 mg/dL — ABNORMAL HIGH (ref 65–99)
Glucose-Capillary: 185 mg/dL — ABNORMAL HIGH (ref 65–99)
Glucose-Capillary: 185 mg/dL — ABNORMAL HIGH (ref 65–99)
Glucose-Capillary: 89 mg/dL (ref 65–99)
Glucose-Capillary: 93 mg/dL (ref 65–99)

## 2016-11-12 LAB — POCT I-STAT, CHEM 8
BUN: 17 mg/dL (ref 6–20)
BUN: 17 mg/dL (ref 6–20)
CALCIUM ION: 1.13 mmol/L — AB (ref 1.15–1.40)
CALCIUM ION: 1.13 mmol/L — AB (ref 1.15–1.40)
CHLORIDE: 96 mmol/L — AB (ref 101–111)
CHLORIDE: 97 mmol/L — AB (ref 101–111)
CREATININE: 0.8 mg/dL (ref 0.61–1.24)
Creatinine, Ser: 0.8 mg/dL (ref 0.61–1.24)
GLUCOSE: 132 mg/dL — AB (ref 65–99)
GLUCOSE: 145 mg/dL — AB (ref 65–99)
HCT: 37 % — ABNORMAL LOW (ref 39.0–52.0)
HCT: 38 % — ABNORMAL LOW (ref 39.0–52.0)
HEMOGLOBIN: 12.9 g/dL — AB (ref 13.0–17.0)
Hemoglobin: 12.6 g/dL — ABNORMAL LOW (ref 13.0–17.0)
POTASSIUM: 6.1 mmol/L — AB (ref 3.5–5.1)
Potassium: 6 mmol/L — ABNORMAL HIGH (ref 3.5–5.1)
Sodium: 134 mmol/L — ABNORMAL LOW (ref 135–145)
Sodium: 134 mmol/L — ABNORMAL LOW (ref 135–145)
TCO2: 25 mmol/L (ref 0–100)
TCO2: 25 mmol/L (ref 0–100)

## 2016-11-12 LAB — COOXEMETRY PANEL
Carboxyhemoglobin: 0.9 % (ref 0.5–1.5)
METHEMOGLOBIN: 1.3 % (ref 0.0–1.5)
O2 Saturation: 78.1 %
TOTAL HEMOGLOBIN: 10.9 g/dL — AB (ref 12.0–16.0)

## 2016-11-12 LAB — CREATININE, SERUM
CREATININE: 0.86 mg/dL (ref 0.61–1.24)
GFR calc Af Amer: >60 mL/min (ref >60)

## 2016-11-12 LAB — MAGNESIUM
MAGNESIUM: 2 mg/dL (ref 1.7–2.4)
Magnesium: 2.3 mg/dL (ref 1.7–2.4)

## 2016-11-12 LAB — BASIC METABOLIC PANEL
Anion gap: 10 (ref 5–15)
BUN: 13 mg/dL (ref 6–20)
CALCIUM: 7.7 mg/dL — AB (ref 8.9–10.3)
CO2: 22 mmol/L (ref 22–32)
CREATININE: 1.1 mg/dL (ref 0.61–1.24)
Chloride: 108 mmol/L (ref 101–111)
GFR calc Af Amer: >60 mL/min (ref >60)
GFR calc non Af Amer: >60 mL/min (ref >60)
GLUCOSE: 175 mg/dL — AB (ref 65–99)
Potassium: 3.3 mmol/L — ABNORMAL LOW (ref 3.5–5.1)
Sodium: 140 mmol/L (ref 135–145)

## 2016-11-12 LAB — POTASSIUM: Potassium: 6.2 mmol/L — ABNORMAL HIGH (ref 3.5–5.1)

## 2016-11-12 MED ORDER — ASPIRIN EC 81 MG PO TBEC
81.0000 mg | DELAYED_RELEASE_TABLET | Freq: Every day | ORAL | Status: DC
  Administered 2016-11-13 – 2016-11-20 (×8): 81 mg via ORAL
  Filled 2016-11-12 (×8): qty 1

## 2016-11-12 MED ORDER — INSULIN ASPART 100 UNIT/ML ~~LOC~~ SOLN
0.0000 [IU] | SUBCUTANEOUS | Status: DC
  Administered 2016-11-12 – 2016-11-14 (×6): 2 [IU] via SUBCUTANEOUS

## 2016-11-12 MED ORDER — INSULIN DETEMIR 100 UNIT/ML ~~LOC~~ SOLN
20.0000 [IU] | Freq: Two times a day (BID) | SUBCUTANEOUS | Status: DC
  Filled 2016-11-12 (×2): qty 0.2

## 2016-11-12 MED ORDER — FUROSEMIDE 10 MG/ML IJ SOLN
20.0000 mg | Freq: Once | INTRAMUSCULAR | Status: AC
  Administered 2016-11-12: 20 mg via INTRAVENOUS

## 2016-11-12 MED ORDER — WARFARIN - PHYSICIAN DOSING INPATIENT
Freq: Every day | Status: DC
  Administered 2016-11-13 – 2016-11-18 (×3)

## 2016-11-12 MED ORDER — SODIUM CHLORIDE 0.9 % IV SOLN
30.0000 meq | Freq: Once | INTRAVENOUS | Status: AC
  Administered 2016-11-12: 30 meq via INTRAVENOUS
  Filled 2016-11-12: qty 15

## 2016-11-12 MED ORDER — SODIUM BICARBONATE 8.4 % IV SOLN
100.0000 meq | Freq: Once | INTRAVENOUS | Status: AC
  Administered 2016-11-12: 100 meq via INTRAVENOUS

## 2016-11-12 MED ORDER — FUROSEMIDE 10 MG/ML IJ SOLN: 40.0000 mg | Freq: Once | INTRAMUSCULAR | Status: DC

## 2016-11-12 MED ORDER — LACTATED RINGERS IV SOLN: INTRAVENOUS | Status: DC

## 2016-11-12 MED ORDER — WARFARIN SODIUM 2.5 MG PO TABS
2.5000 mg | ORAL_TABLET | Freq: Every day | ORAL | Status: DC
  Administered 2016-11-12 – 2016-11-13 (×2): 2.5 mg via ORAL
  Filled 2016-11-12 (×2): qty 1

## 2016-11-12 MED ORDER — FUROSEMIDE 10 MG/ML IJ SOLN
40.0000 mg | Freq: Two times a day (BID) | INTRAMUSCULAR | Status: DC
  Administered 2016-11-12 – 2016-11-14 (×4): 40 mg via INTRAVENOUS
  Filled 2016-11-12 (×4): qty 4

## 2016-11-12 NOTE — Progress Notes (Signed)
Pre-extubation gas called to Dr. Cornelius Moras. Orders to give 2 amps bicarb and extubate. Will continue to monitor patient.

## 2016-11-12 NOTE — Procedures (Signed)
Extubation Procedure Note  Patient Details:   Name: [name] [name] DOB: 1956-04-28 MRN: [id number]   Airway Documentation:     Evaluation  O2 sats: stable throughout Complications: No apparent complications Patient did tolerate procedure well. Bilateral Breath Sounds: Clear   Yes   RT extubated pt. Per protocol. Pt. Performed 1.2L on the vital capacity and .22 on the NIF. Pt. Had a positive cuff leak. Pt. Was placed on 4L nasal cannula and performed 500x5 on the IS. Pt. Tolerating well at this time.  Adela Ports 11/12/2016, 1:12 AM

## 2016-11-12 NOTE — Progress Notes (Signed)
CT surgery p.m. Rounds  Patient had a stable day walking hallway Paced rhythm stable blood pressure and hemodynamics on low-dose milrinone 0.3 Chest tube drainage approximately 25 cc/h

## 2016-11-12 NOTE — Care Management Note (Signed)
Case Management Note  Patient Details  Name: JINO MARKS MRN: [id number] Date of Birth: 02-04-1956  Subjective/Objective:   Pt is s/p MVR                 Action/Plan:  PTA from home with wife.  CM will continue to follow for discharge needs   Expected Discharge Date:                  Expected Discharge Plan:  Home/Self Care  In-House Referral:     Discharge planning Services  CM Consult  Post Acute Care Choice:    Choice offered to:     DME Arranged:    DME Agency:     HH Arranged:    HH Agency:     Status of Service:  In process, will continue to follow  If discussed at Long Length of Stay Meetings, dates discussed:    Additional Comments:  Cherylann Parr, RN 11/12/2016, 10:40 AM

## 2016-11-12 NOTE — Progress Notes (Addendum)
[facility] DAILY ICU PROGRESS NOTE                   [address]            ,[place] [address]          [phone]   1 Day Post-Op Procedure(s) (LRB): MINIMALLY INVASIVE MITRAL VALVE REPLACEMENT (MVR) WITH 33 MM CARBOMEDICS OPTIFORM MECHANICAL VALVE (N/A) MINIMALLY INVASIVE MAZE PROCEDURE WITH APPLICATION OF ATRICLIP PRO 2 45 ON LAA (N/A) TRANSESOPHAGEAL ECHOCARDIOGRAM (TEE) (N/A) TRICUSPID VALVE REPAIR WITH EDWARDS MC3 TRICUSPID 28 MM ANNULOPLASTY RING MODEL 4900 (N/A)  Total Length of Stay:  LOS: 1 day   Subjective: Feeling pretty well, Very talkative  Objective: Vital signs in last 24 hours: Temp:  [96.6 F (35.9 C)-98.8 F (37.1 C)] 97 F (36.1 C) (01/05 0715) Pulse Rate:  [57-89] 84 (01/05 0715) Cardiac Rhythm: A-V Sequential paced (01/05 0700) Resp:  [10-32] 23 (01/05 0715) BP: (97-132)/(48-74) 120/61 (01/05 0700) SpO2:  [92 %-100 %] 97 % (01/05 0715) Arterial Line BP: (103-151)/(47-74) 140/50 (01/05 0715) FiO2 (%):  [40 %-70 %] 40 % (01/05 0100) Weight:  [176 lb 9.4 oz (80.1 kg)] 176 lb 9.4 oz (80.1 kg) (01/05 0330)  Filed Weights   11/11/16 0549 11/12/16 0330  Weight: 167 lb (75.8 kg) 176 lb 9.4 oz (80.1 kg)    Weight change: 9 lb 9.4 oz (4.349 kg)   Hemodynamic parameters for last 24 hours: PAP: (25-52)/(14-31) 48/19 CO:  [4.3 L/min-7.3 L/min] 6.9 L/min CI:  [2.2 L/min/m2-3.8 L/min/m2] 3.6 L/min/m2  Mixed venous co-ox 78%  Intake/Output from previous day: 01/04 0701 - 01/05 0700 In: 7979.4 [P.O.:480; I.V.:5999.4; NG/GT:120; IV Piggyback:1380] Out: 4665 [Urine:3405; Blood:800; Chest Tube:460]  Intake/Output this shift: Total I/O In: 50 [IV Piggyback:50] Out: -   Current Meds: Scheduled Meds: . acetaminophen  1,000 mg Oral Q6H  . aspirin EC  325 mg Oral Daily  . bisacodyl  10 mg Oral Daily   Or  . bisacodyl  10 mg Rectal Daily  . cefUROXime (ZINACEF)  IV  1.5 g Intravenous Q12H  . chlorhexidine gluconate (MEDLINE KIT)  15 mL Mouth  Rinse BID  . docusate sodium  200 mg Oral Daily  . famotidine (PEPCID) IV  20 mg Intravenous Q12H  . insulin aspart  0-24 Units Subcutaneous Q4H  . insulin detemir  20 Units Subcutaneous BID  . insulin regular  0-10 Units Intravenous TID WC  . mouth rinse  15 mL Mouth Rinse QID  . [START ON 11/13/2016] pantoprazole  40 mg Oral Daily  . potassium chloride (KCL MULTIRUN) 30 mEq in 265 mL IVPB  30 mEq Intravenous Once  . potassium chloride (KCL MULTIRUN) 30 mEq in 265 mL IVPB  30 mEq Intravenous Once  . sodium chloride flush  3 mL Intravenous Q12H  . warfarin  2.5 mg Oral q1800  . Warfarin - Physician Dosing Inpatient   Does not apply q1800   Continuous Infusions: . sodium chloride 250 mL (11/12/16 0600)  . EPINEPHrine 4 mg in dextrose 5% 250 mL infusion (16 mcg/mL) 3 mcg/min (11/12/16 0700)  . insulin (NOVOLIN-R) infusion 7.2 Units/hr (11/12/16 0720)  . milrinone 0.375 mcg/kg/min (11/12/16 0722)  . phenylephrine (NEO-SYNEPHRINE) Adult infusion 15 mcg/min (11/12/16 0732)   PRN Meds:.albumin human, metoprolol, morphine injection, ondansetron (ZOFRAN) IV, oxyCODONE, sodium chloride flush, traMADol  General appearance: alert, cooperative and no distress Heart: regular rate and rhythm, no rub and + valve click Lungs: dim right base Abdomen:  soft, non-tender, nondistended Extremities: no sig edema Wound: dressings intact  Lab Results: CBC: Recent Labs  11/11/16 2130 11/11/16 2135 11/12/16 0340  WBC 21.5*  --  23.3*  HGB 12.2* 12.2* 12.3*  HCT 36.9* 36.0* 38.0*  PLT 184  --  182   BMET:  Recent Labs  11/09/16 0905  11/11/16 2135 11/12/16 0340  NA 135  < > 141 140  K 4.6  < > 3.7 3.3*  CL 105  < > 105 108  CO2 24  --   --  22  GLUCOSE 95  < > 172* 175*  BUN 17  < > 15 13  CREATININE 1.10  < > 0.90 1.10  CALCIUM 9.3  --   --  7.7*  < > = values in this interval not displayed.  CMET: Lab Results  Component Value Date   WBC 23.3 (H) 11/12/2016   HGB 12.3 (L) 11/12/2016     HCT 38.0 (L) 11/12/2016   PLT 182 11/12/2016   GLUCOSE 175 (H) 11/12/2016   ALT 23 11/09/2016   AST 22 11/09/2016   NA 140 11/12/2016   K 3.3 (L) 11/12/2016   CL 108 11/12/2016   CREATININE 1.10 11/12/2016   BUN 13 11/12/2016   CO2 22 11/12/2016   TSH 1.303 10/02/2016   INR 1.38 11/11/2016   HGBA1C 5.6 11/09/2016    PT/INR:  Recent Labs  11/11/16 1545  LABPROT 17.0*  INR 1.38   Radiology: Dg Chest Port 1 View  Result Date: 11/11/2016 CLINICAL DATA:  Atelectasis, atrial fibrillation, dyspnea, exertional angina, heart murmur, non ischemic cardiomyopathy, palpitations, 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 Nasogastric tube extends into stomach. LEFT jugular Swan-Ganz catheter tip projects over proximal descending interlobar pulmonary artery. Epicardial pacing wires present. Mediastinal drain and RIGHT thoracostomy tube present. Enlargement of cardiac silhouette post MVR and TVR. LEFT atrial appendage clip noted. Mild pulmonary vascular congestion. Probable RIGHT perihilar edema with small RIGHT pleural effusion and mild mid lung atelectasis. No definite pneumothorax. Osseous structures unremarkable. IMPRESSION: Postoperative changes as above with mild atelectasis and asymmetric RIGHT lung edema. Electronically Signed   By: Lavonia Dana M.D.   On: 11/11/2016 16:25     Assessment/Plan: S/P Procedure(s) (LRB): MINIMALLY INVASIVE MITRAL VALVE REPLACEMENT (MVR) WITH 33 MM CARBOMEDICS OPTIFORM MECHANICAL VALVE (N/A) MINIMALLY INVASIVE MAZE PROCEDURE WITH APPLICATION OF ATRICLIP PRO 2 45 ON LAA (N/A) TRANSESOPHAGEAL ECHOCARDIOGRAM (TEE) (N/A) TRICUSPID VALVE REPAIR WITH EDWARDS MC3 TRICUSPID 28 MM ANNULOPLASTY RING MODEL 4900 (N/A)  1 hemodyn stable on epi, milrinone, and neo- CI2.2, should be able to wean.  2 AV  paced, underlying rhythm is junct in 60's- continue for now. EKG with nonspec twave changes, incomplete RBBB 3 extubated on 6 liters- reperfusion edema and infiltrate/atx  Pattern on CXR, cont to push IS. No wheeze on exam, no fevers. Leukocytosis likely reactive. + significant smoking history. Will need to follow closely. Consider nebs. Dis receive  IV steroids.  4 keep SGANZ , aline and chest tubes in place for now. CT drainage is pretty low.  5 replace K+ 6 coumadin for mechanical valve 6 renal fxn is normal , will need lasix for volume overload, spontaneous diuresis is food currently.  GOLD,WAYNE E 11/12/2016 7:38 AM   I have seen and examined the patient and agree with the assessment and  plan as outlined.  Overall looks very good.  Junctional rhythm 50-60 under pacer.  Will not A-pace.  Stable hemodynamics w/ AV pacing on milrinone @ 0.5 Epi @ 3 and now off Neo drip.  PA pressures relatively low, C.I.>3 and co-ox 78%.  Breathing comfortably w/ O2 sats 97% on 5 L/min via [place].  CXR w/ moderate R lung opacity c/w acute lung injury.  Labs look good.  Leukocytosis and increased insulin requirement likely related to IV steroids given last night.  Wean Epi slowly.  Decrease milrinone 0.3 then wean off over next 2-3 days.  Hold all beta blockers and continue AV pacing.  Mobilize.  D/C lines.  Start Coumadin.  Leave chest tubes at least 2-3 days.  D/C foley later today or tomorrow.  Insert PICC and D/C sleeve.  Avoid beta agonists if possible.   Rexene Alberts, MD 11/12/2016 8:55 AM

## 2016-11-12 NOTE — Plan of Care (Signed)
Dr. Cornelius Moras aware of K level 6.2

## 2016-11-12 NOTE — Progress Notes (Signed)
1 hour follow up extubation gas O2 sats 91%, increased nasal canula to 6L and focused on pulmonary tolieting. Gas rechecked in 30 mins and called to Dr. Cornelius Moras. Orders received, will continue to monitor patient.

## 2016-11-12 NOTE — Progress Notes (Signed)
Wean protocol initiated. 

## 2016-11-13 ENCOUNTER — Inpatient Hospital Stay (HOSPITAL_COMMUNITY): Payer: BLUE CROSS/BLUE SHIELD

## 2016-11-13 LAB — CBC
HCT: 34.9 % — ABNORMAL LOW (ref 39.0–52.0)
HEMOGLOBIN: 11.4 g/dL — AB (ref 13.0–17.0)
MCH: 30.6 pg (ref 26.0–34.0)
MCHC: 32.7 g/dL (ref 30.0–36.0)
MCV: 93.6 fL (ref 78.0–100.0)
PLATELETS: 121 10*3/uL — AB (ref 150–400)
RBC: 3.73 MIL/uL — AB (ref 4.22–5.81)
RDW: 15.8 % — ABNORMAL HIGH (ref 11.5–15.5)
WBC: 24.2 10*3/uL — AB (ref 4.0–10.5)

## 2016-11-13 LAB — BASIC METABOLIC PANEL
ANION GAP: 7 (ref 5–15)
BUN: 18 mg/dL (ref 6–20)
CALCIUM: 8.2 mg/dL — AB (ref 8.9–10.3)
CHLORIDE: 96 mmol/L — AB (ref 101–111)
CO2: 27 mmol/L (ref 22–32)
CREATININE: 0.99 mg/dL (ref 0.61–1.24)
GFR calc non Af Amer: >60 mL/min (ref >60)
Glucose, Bld: 127 mg/dL — ABNORMAL HIGH (ref 65–99)
Potassium: 5.1 mmol/L (ref 3.5–5.1)
SODIUM: 130 mmol/L — AB (ref 135–145)

## 2016-11-13 LAB — COOXEMETRY PANEL
CARBOXYHEMOGLOBIN: 1.6 % — AB (ref 0.5–1.5)
Methemoglobin: 1 % (ref 0.0–1.5)
O2 Saturation: 72.1 %
Total hemoglobin: 11.5 g/dL — ABNORMAL LOW (ref 12.0–16.0)

## 2016-11-13 LAB — GLUCOSE, CAPILLARY
GLUCOSE-CAPILLARY: 125 mg/dL — AB (ref 65–99)
GLUCOSE-CAPILLARY: 126 mg/dL — AB (ref 65–99)
GLUCOSE-CAPILLARY: 93 mg/dL (ref 65–99)
Glucose-Capillary: 123 mg/dL — ABNORMAL HIGH (ref 65–99)
Glucose-Capillary: 115 mg/dL — ABNORMAL HIGH (ref 65–99)
Glucose-Capillary: 135 mg/dL — ABNORMAL HIGH (ref 65–99)
Glucose-Capillary: 117 mg/dL — ABNORMAL HIGH (ref 65–99)

## 2016-11-13 LAB — PROTIME-INR
INR: 1.4
PROTHROMBIN TIME: 17.3 s — AB (ref 11.4–15.2)

## 2016-11-13 MED ORDER — HYDROCOD POLST-CPM POLST ER 10-8 MG/5ML PO SUER
5.0000 mL | Freq: Four times a day (QID) | ORAL | Status: DC | PRN
  Administered 2016-11-13 (×2): 5 mL via ORAL
  Filled 2016-11-13 (×2): qty 5

## 2016-11-13 MED ORDER — SODIUM CHLORIDE 0.9% FLUSH
10.0000 mL | INTRAVENOUS | Status: DC | PRN
Start: 2016-11-13 — End: 2016-11-20
  Administered 2016-11-17: 10 mL
  Filled 2016-11-13: qty 40

## 2016-11-13 MED ORDER — SODIUM CHLORIDE 0.9% FLUSH
10.0000 mL | Freq: Two times a day (BID) | INTRAVENOUS | Status: DC
  Administered 2016-11-13 – 2016-11-14 (×3): 10 mL

## 2016-11-13 NOTE — Progress Notes (Signed)
CT surgery p.m. Rounds  Patient feels stronger and relates his cough has improved Ambulated in the hallway Remains AV paced with good blood pressure Continue current care

## 2016-11-13 NOTE — Progress Notes (Signed)
2 Days Post-Op Procedure(s) (LRB): MINIMALLY INVASIVE MITRAL VALVE REPLACEMENT (MVR) WITH 33 MM CARBOMEDICS OPTIFORM MECHANICAL VALVE (N/A) MINIMALLY INVASIVE MAZE PROCEDURE WITH APPLICATION OF ATRICLIP PRO 2 45 ON LAA (N/A) TRANSESOPHAGEAL ECHOCARDIOGRAM (TEE) (N/A) TRICUSPID VALVE REPAIR WITH EDWARDS MC3 TRICUSPID 28 MM ANNULOPLASTY RING MODEL 4900 (N/A) Subjective: Dry cough Slow junctional HR 45 Paced AV 80 Objective: Vital signs in last 24 hours: Temp:  [97.6 F (36.4 C)-98.5 F (36.9 C)] 98.5 F (36.9 C) (01/06 0700) Pulse Rate:  [79-80] 79 (01/06 0900) Cardiac Rhythm: A-V Sequential paced (01/06 0900) Resp:  [20-38] 29 (01/06 0900) BP: (118-136)/(59-76) 124/70 (01/06 0900) SpO2:  [94 %-100 %] 95 % (01/06 0900) Weight:  [178 lb 2.1 oz (80.8 kg)] 178 lb 2.1 oz (80.8 kg) (01/06 0500)  Hemodynamic parameters for last 24 hours:  stable CO-Ox 70- wean mil to .25  Intake/Output from previous day: 01/05 0701 - 01/06 0700 In: 1725.7 [P.O.:780; I.V.:580.7; IV Piggyback:365] Out: 2220 [Urine:1660; Chest Tube:560] Intake/Output this shift: Total I/O In: 93.6 [I.V.:43.6; IV Piggyback:50] Out: 100 [Urine:100]       Exam    General- alert and comfortable   Lungs- clear without rales, wheezes   Cor- regular rate and rhythm, no murmur , gallop   Abdomen- soft, non-tender   Extremities - warm, non-tender, minimal edema   Neuro- oriented, appropriate, no focal weakness   Lab Results:  Recent Labs  11/12/16 1556 11/12/16 1602 11/13/16 0430  WBC 26.0*  --  24.2*  HGB 12.2* 12.6* 11.4*  HCT 37.0* 37.0* 34.9*  PLT 139*  --  121*   BMET:  Recent Labs  11/12/16 0340  11/12/16 1602 11/13/16 0430  NA 140  < > 134* 130*  K 3.3*  < > 6.0* 5.1  CL 108  < > 96* 96*  CO2 22  --   --  27  GLUCOSE 175*  < > 145* 127*  BUN 13  < > 17 18  CREATININE 1.10  < > 0.80 0.99  CALCIUM 7.7*  --   --  8.2*  < > = values in this interval not displayed.  PT/INR:  Recent Labs  11/13/16 0430  LABPROT 17.3*  INR 1.40   ABG    Component Value Date/Time   PHART 7.332 (L) 11/12/2016 0506   HCO3 21.4 11/12/2016 0506   TCO2 25 11/12/2016 1602   ACIDBASEDEF 5.0 (H) 11/12/2016 0506   O2SAT 72.1 11/13/2016 0445   CBG (last 3)   Recent Labs  11/12/16 2022 11/13/16 0017 11/13/16 0410  GLUCAP 119* 126* 123*    Assessment/Plan: S/P Procedure(s) (LRB): MINIMALLY INVASIVE MITRAL VALVE REPLACEMENT (MVR) WITH 33 MM CARBOMEDICS OPTIFORM MECHANICAL VALVE (N/A) MINIMALLY INVASIVE MAZE PROCEDURE WITH APPLICATION OF ATRICLIP PRO 2 45 ON LAA (N/A) TRANSESOPHAGEAL ECHOCARDIOGRAM (TEE) (N/A) TRICUSPID VALVE REPAIR WITH EDWARDS MC3 TRICUSPID 28 MM ANNULOPLASTY RING MODEL 4900 (N/A) Mobilize Diuresis coumadin for mechanical MVR   LOS: 2 days    Kathlee Nations Trigt III 11/13/2016

## 2016-11-13 NOTE — Progress Notes (Signed)
Peripherally Inserted Central Catheter/Midline Placement  The IV Nurse has discussed with the patient and/or persons authorized to consent for the patient, the purpose of this procedure and the potential benefits and risks involved with this procedure.  The benefits include less needle sticks, lab draws from the catheter, and the patient may be discharged home with the catheter. Risks include, but not limited to, infection, bleeding, blood clot (thrombus formation), and puncture of an artery; nerve damage and irregular heartbeat and possibility to perform a PICC exchange if needed/ordered by physician.  Alternatives to this procedure were also discussed.  Bard Power PICC patient education guide, fact sheet on infection prevention and patient information card has been provided to patient /or left at bedside.    PICC/Midline Placement Documentation  PICC Double Lumen 11/13/16 PICC Right Basilic 41 cm 0 cm (Active)  Indication for Insertion or Continuance of Line Vasoactive infusions 11/13/2016 12:00 PM  Exposed Catheter (cm) 0 cm 11/13/2016 12:00 PM  Dressing Change Due 11/20/16 11/13/2016 12:00 PM       Stacie Glaze Horton 11/13/2016, 12:16 PM

## 2016-11-14 ENCOUNTER — Inpatient Hospital Stay (HOSPITAL_COMMUNITY): Payer: BLUE CROSS/BLUE SHIELD

## 2016-11-14 LAB — BASIC METABOLIC PANEL
Anion gap: 7 (ref 5–15)
BUN: 18 mg/dL (ref 6–20)
CO2: 31 mmol/L (ref 22–32)
Calcium: 8 mg/dL — ABNORMAL LOW (ref 8.9–10.3)
Chloride: 93 mmol/L — ABNORMAL LOW (ref 101–111)
Creatinine, Ser: 0.86 mg/dL (ref 0.61–1.24)
GFR calc Af Amer: >60 mL/min (ref >60)
GFR calc non Af Amer: >60 mL/min (ref >60)
Glucose, Bld: 93 mg/dL (ref 65–99)
Potassium: 4.3 mmol/L (ref 3.5–5.1)
Sodium: 131 mmol/L — ABNORMAL LOW (ref 135–145)

## 2016-11-14 LAB — CBC
HCT: 31.1 % — ABNORMAL LOW (ref 39.0–52.0)
Hemoglobin: 10.3 g/dL — ABNORMAL LOW (ref 13.0–17.0)
MCH: 30.5 pg (ref 26.0–34.0)
MCHC: 33.1 g/dL (ref 30.0–36.0)
MCV: 92 fL (ref 78.0–100.0)
Platelets: 102 10*3/uL — ABNORMAL LOW (ref 150–400)
RBC: 3.38 MIL/uL — ABNORMAL LOW (ref 4.22–5.81)
RDW: 15.6 % — ABNORMAL HIGH (ref 11.5–15.5)
WBC: 16.8 10*3/uL — ABNORMAL HIGH (ref 4.0–10.5)

## 2016-11-14 LAB — GLUCOSE, CAPILLARY
GLUCOSE-CAPILLARY: 93 mg/dL (ref 65–99)
GLUCOSE-CAPILLARY: 132 mg/dL — AB (ref 65–99)
GLUCOSE-CAPILLARY: 106 mg/dL — AB (ref 65–99)
GLUCOSE-CAPILLARY: 123 mg/dL — AB (ref 65–99)
Glucose-Capillary: 128 mg/dL — ABNORMAL HIGH (ref 65–99)

## 2016-11-14 LAB — COOXEMETRY PANEL
Carboxyhemoglobin: 1.7 % — ABNORMAL HIGH (ref 0.5–1.5)
Methemoglobin: 0.8 % (ref 0.0–1.5)
O2 Saturation: 56.6 %
TOTAL HEMOGLOBIN: 10 g/dL — AB (ref 12.0–16.0)

## 2016-11-14 LAB — PROTIME-INR
INR: 1.29
PROTHROMBIN TIME: 16.2 s — AB (ref 11.4–15.2)

## 2016-11-14 MED ORDER — WARFARIN SODIUM 5 MG PO TABS
5.0000 mg | ORAL_TABLET | Freq: Every day | ORAL | Status: DC
  Administered 2016-11-14 – 2016-11-19 (×6): 5 mg via ORAL
  Filled 2016-11-14 (×6): qty 1

## 2016-11-14 MED ORDER — DICLOFENAC SODIUM 1 % TD GEL
2.0000 g | Freq: Four times a day (QID) | TRANSDERMAL | Status: DC
  Administered 2016-11-14 – 2016-11-19 (×14): 2 g via TOPICAL
  Filled 2016-11-14: qty 100

## 2016-11-14 MED ORDER — INSULIN ASPART 100 UNIT/ML ~~LOC~~ SOLN: 0.0000 [IU] | Freq: Every day | SUBCUTANEOUS | Status: DC

## 2016-11-14 MED ORDER — FUROSEMIDE 10 MG/ML IJ SOLN: 40.0000 mg | Freq: Every day | INTRAMUSCULAR | Status: DC

## 2016-11-14 MED ORDER — PRO-STAT SUGAR FREE PO LIQD
30.0000 mL | Freq: Three times a day (TID) | ORAL | Status: DC
  Administered 2016-11-14 (×2): 30 mL via ORAL
  Filled 2016-11-14: qty 30

## 2016-11-14 MED ORDER — ENOXAPARIN SODIUM 40 MG/0.4ML ~~LOC~~ SOLN
40.0000 mg | Freq: Every day | SUBCUTANEOUS | Status: DC
  Administered 2016-11-14 – 2016-11-15 (×2): 40 mg via SUBCUTANEOUS
  Filled 2016-11-14 (×2): qty 0.4

## 2016-11-14 MED ORDER — INSULIN ASPART 100 UNIT/ML ~~LOC~~ SOLN
0.0000 [IU] | Freq: Three times a day (TID) | SUBCUTANEOUS | Status: DC
  Administered 2016-11-14: 2 [IU] via SUBCUTANEOUS

## 2016-11-14 MED ORDER — GABAPENTIN 300 MG PO CAPS
300.0000 mg | ORAL_CAPSULE | Freq: Two times a day (BID) | ORAL | Status: AC
  Administered 2016-11-14 – 2016-11-17 (×6): 300 mg via ORAL
  Filled 2016-11-14 (×6): qty 1

## 2016-11-14 MED ORDER — SORBITOL 70 % SOLN
45.0000 mL | Freq: Every day | Status: DC | PRN
  Filled 2016-11-14 (×2): qty 60

## 2016-11-14 MED ORDER — ORAL CARE MOUTH RINSE
15.0000 mL | Freq: Two times a day (BID) | OROMUCOSAL | Status: DC
  Administered 2016-11-14 – 2016-11-19 (×5): 15 mL via OROMUCOSAL

## 2016-11-14 MED ORDER — GUAIFENESIN ER 600 MG PO TB12
600.0000 mg | ORAL_TABLET | Freq: Two times a day (BID) | ORAL | Status: AC
  Administered 2016-11-14 – 2016-11-17 (×7): 600 mg via ORAL
  Filled 2016-11-14 (×7): qty 1

## 2016-11-14 NOTE — Plan of Care (Signed)
Problem: Activity: Goal: Risk for activity intolerance will decrease Outcome: Progressing Pt ambulated more today  Problem: Bowel/Gastric: Goal: Gastrointestinal status for postoperative course will improve Outcome: Progressing Active bowel sounds, + flatus  Problem: Pain Management: Goal: Pain level will decrease Outcome: Progressing Pt still complains of significant pain, regimen adjusted today, pt coping better  Problem: Urinary Elimination: Goal: Ability to achieve and maintain adequate renal perfusion and functioning will improve Outcome: Progressing Foley removed, urinating, labs stable

## 2016-11-14 NOTE — Progress Notes (Signed)
3 Days Post-Op Procedure(s) (LRB): MINIMALLY INVASIVE MITRAL VALVE REPLACEMENT (MVR) WITH 33 MM CARBOMEDICS OPTIFORM MECHANICAL VALVE (N/A) MINIMALLY INVASIVE MAZE PROCEDURE WITH APPLICATION OF ATRICLIP PRO 2 45 ON LAA (N/A) TRANSESOPHAGEAL ECHOCARDIOGRAM (TEE) (N/A) TRICUSPID VALVE REPAIR WITH EDWARDS MC3 TRICUSPID 28 MM ANNULOPLASTY RING MODEL 4900 (N/A) Subjective: HR 50 off pacer Co-ox 58% on .25 mil Almost 500 cc chest tube drainage INR remains low Objective: Vital signs in last 24 hours: Temp:  [98.2 F (36.8 C)-98.8 F (37.1 C)] 98.7 F (37.1 C) (01/07 0700) Pulse Rate:  [79-80] 80 (01/07 0700) Cardiac Rhythm: A-V Sequential paced (01/06 2000) Resp:  [15-34] 22 (01/07 0700) BP: (105-123)/(55-69) 110/55 (01/07 0700) SpO2:  [90 %-97 %] 96 % (01/07 0700) Weight:  [175 lb 0.7 oz (79.4 kg)] 175 lb 0.7 oz (79.4 kg) (01/07 0500)  Hemodynamic parameters for last 24 hours:  stable  Intake/Output from previous day: 01/06 0701 - 01/07 0700 In: 1630.2 [P.O.:1040; I.V.:540.2; IV Piggyback:50] Out: 3160 [Urine:2700; Chest Tube:460] Intake/Output this shift: No intake/output data recorded.  Lungs clear Min edema  Lab Results:  Recent Labs  11/13/16 0430 11/14/16 0500  WBC 24.2* 16.8*  HGB 11.4* 10.3*  HCT 34.9* 31.1*  PLT 121* 102*   BMET:  Recent Labs  11/13/16 0430 11/14/16 0500  NA 130* 131*  K 5.1 4.3  CL 96* 93*  CO2 27 31  GLUCOSE 127* 93  BUN 18 18  CREATININE 0.99 0.86  CALCIUM 8.2* 8.0*    PT/INR:  Recent Labs  11/14/16 0500  LABPROT 16.2*  INR 1.29   ABG    Component Value Date/Time   PHART 7.332 (L) 11/12/2016 0506   HCO3 21.4 11/12/2016 0506   TCO2 25 11/12/2016 1602   ACIDBASEDEF 5.0 (H) 11/12/2016 0506   O2SAT 56.6 11/14/2016 0508   CBG (last 3)   Recent Labs  11/14/16 0002 11/14/16 0503 11/14/16 0856  GLUCAP 93 93 132*    Assessment/Plan: S/P Procedure(s) (LRB): MINIMALLY INVASIVE MITRAL VALVE REPLACEMENT (MVR) WITH 33 MM  CARBOMEDICS OPTIFORM MECHANICAL VALVE (N/A) MINIMALLY INVASIVE MAZE PROCEDURE WITH APPLICATION OF ATRICLIP PRO 2 45 ON LAA (N/A) TRANSESOPHAGEAL ECHOCARDIOGRAM (TEE) (N/A) TRICUSPID VALVE REPAIR WITH EDWARDS MC3 TRICUSPID 28 MM ANNULOPLASTY RING MODEL 4900 (N/A) Mobilize Diuresis increase coumadin to 5mg   Add low dose lovenox   LOS: 3 days    Kathlee Nations Trigt III 11/14/2016

## 2016-11-14 NOTE — Plan of Care (Signed)
Problem: Respiratory: Goal: Levels of oxygenation will improve Outcome: Not Progressing Oxygen requirements up with activity, unable to wean

## 2016-11-15 ENCOUNTER — Inpatient Hospital Stay (HOSPITAL_COMMUNITY): Payer: BLUE CROSS/BLUE SHIELD

## 2016-11-15 LAB — BASIC METABOLIC PANEL
Anion gap: 7 (ref 5–15)
BUN: 16 mg/dL (ref 6–20)
CO2: 33 mmol/L — ABNORMAL HIGH (ref 22–32)
Calcium: 8.1 mg/dL — ABNORMAL LOW (ref 8.9–10.3)
Chloride: 93 mmol/L — ABNORMAL LOW (ref 101–111)
Creatinine, Ser: 0.73 mg/dL (ref 0.61–1.24)
GFR calc Af Amer: >60 mL/min (ref >60)
GFR calc non Af Amer: >60 mL/min (ref >60)
Glucose, Bld: 94 mg/dL (ref 65–99)
Potassium: 3.7 mmol/L (ref 3.5–5.1)
Sodium: 133 mmol/L — ABNORMAL LOW (ref 135–145)

## 2016-11-15 LAB — CBC
HCT: 27.9 % — ABNORMAL LOW (ref 39.0–52.0)
Hemoglobin: 9.5 g/dL — ABNORMAL LOW (ref 13.0–17.0)
MCH: 31 pg (ref 26.0–34.0)
MCHC: 34.1 g/dL (ref 30.0–36.0)
MCV: 91.2 fL (ref 78.0–100.0)
Platelets: 116 10*3/uL — ABNORMAL LOW (ref 150–400)
RBC: 3.06 MIL/uL — ABNORMAL LOW (ref 4.22–5.81)
RDW: 15.7 % — ABNORMAL HIGH (ref 11.5–15.5)
WBC: 12.2 10*3/uL — ABNORMAL HIGH (ref 4.0–10.5)

## 2016-11-15 LAB — COOXEMETRY PANEL
Carboxyhemoglobin: 1.4 % (ref 0.5–1.5)
METHEMOGLOBIN: 1.2 % (ref 0.0–1.5)
O2 Saturation: 67.1 %
Total hemoglobin: 9.5 g/dL — ABNORMAL LOW (ref 12.0–16.0)

## 2016-11-15 LAB — GLUCOSE, CAPILLARY
GLUCOSE-CAPILLARY: 150 mg/dL — AB (ref 65–99)
Glucose-Capillary: 95 mg/dL (ref 65–99)

## 2016-11-15 LAB — PROTIME-INR
INR: 1.61
PROTHROMBIN TIME: 19.3 s — AB (ref 11.4–15.2)

## 2016-11-15 MED ORDER — POLYSACCHARIDE IRON COMPLEX 150 MG PO CAPS
150.0000 mg | ORAL_CAPSULE | Freq: Every day | ORAL | Status: DC
  Administered 2016-11-15 – 2016-11-20 (×6): 150 mg via ORAL
  Filled 2016-11-15 (×6): qty 1

## 2016-11-15 MED ORDER — FUROSEMIDE 10 MG/ML IJ SOLN
40.0000 mg | Freq: Two times a day (BID) | INTRAMUSCULAR | Status: DC
  Administered 2016-11-15 – 2016-11-19 (×9): 40 mg via INTRAVENOUS
  Filled 2016-11-15 (×9): qty 4

## 2016-11-15 MED ORDER — SODIUM CHLORIDE 0.9 % IV SOLN: 250.0000 mL | INTRAVENOUS | Status: DC | PRN

## 2016-11-15 MED ORDER — SODIUM CHLORIDE 0.9% FLUSH: 3.0000 mL | INTRAVENOUS | Status: DC | PRN

## 2016-11-15 MED ORDER — MOVING RIGHT ALONG BOOK
Freq: Once | Status: AC
  Administered 2016-11-15: 1
  Filled 2016-11-15: qty 1

## 2016-11-15 MED ORDER — POTASSIUM CHLORIDE CRYS ER 20 MEQ PO TBCR
20.0000 meq | EXTENDED_RELEASE_TABLET | Freq: Two times a day (BID) | ORAL | Status: DC
  Administered 2016-11-15 – 2016-11-20 (×11): 20 meq via ORAL
  Filled 2016-11-15 (×11): qty 1

## 2016-11-15 MED ORDER — SODIUM CHLORIDE 0.9% FLUSH
3.0000 mL | Freq: Two times a day (BID) | INTRAVENOUS | Status: DC
  Administered 2016-11-16: 3 mL via INTRAVENOUS

## 2016-11-15 MED ORDER — FA-PYRIDOXINE-CYANOCOBALAMIN 2.5-25-2 MG PO TABS
1.0000 | ORAL_TABLET | Freq: Every day | ORAL | Status: DC
  Administered 2016-11-15 – 2016-11-20 (×5): 1 via ORAL
  Filled 2016-11-15 (×6): qty 1

## 2016-11-15 MED ORDER — SODIUM CHLORIDE 0.9 % IV SOLN
30.0000 meq | Freq: Once | INTRAVENOUS | Status: AC
  Administered 2016-11-15: 30 meq via INTRAVENOUS
  Filled 2016-11-15: qty 15

## 2016-11-15 MED FILL — Sodium Chloride IV Soln 0.9%: INTRAVENOUS | Qty: 2000 | Status: AC

## 2016-11-15 MED FILL — Mannitol IV Soln 20%: INTRAVENOUS | Qty: 500 | Status: AC

## 2016-11-15 MED FILL — Lidocaine HCl IV Inj 20 MG/ML: INTRAVENOUS | Qty: 5 | Status: AC

## 2016-11-15 MED FILL — Electrolyte-R (PH 7.4) Solution: INTRAVENOUS | Qty: 4000 | Status: AC

## 2016-11-15 MED FILL — Sodium Bicarbonate IV Soln 8.4%: INTRAVENOUS | Qty: 50 | Status: AC

## 2016-11-15 MED FILL — Heparin Sodium (Porcine) Inj 1000 Unit/ML: INTRAMUSCULAR | Qty: 20 | Status: AC

## 2016-11-15 NOTE — Care Management Note (Signed)
Case Management Note  Patient Details  Name: [name] [name] MRN: [id number] Date of Birth: Jun 07, 1956  Subjective/Objective:  S/p  MVR, MAZE                 Action/Plan: Discharge Planning: NCM spoke to pt and states he lives at home with wife, [name]. States he will need RW for home. Pt may also need oxygen at time of dc. Will continue to monitor for dc needs.    PCP Lupe Carney MD  Expected Discharge Date:                  Expected Discharge Plan:  Home/Self Care  In-House Referral:  NA  Discharge planning Services  CM Consult  Post Acute Care Choice:  NA Choice offered to:  NA  DME Arranged:  [place] rolling DME Agency:  [facility]  HH Arranged:  NA HH Agency:  NA  Status of Service:  In process, will continue to follow  If discussed at Long Length of Stay Meetings, dates discussed:    Additional Comments:  Elliot Cousin, RN 11/15/2016, 10:47 AM

## 2016-11-15 NOTE — Progress Notes (Signed)
[address]       [name] [address]             [phone]        CARDIOTHORACIC SURGERY PROGRESS NOTE   R4 Days Post-Op Procedure(s) (LRB): MINIMALLY INVASIVE MITRAL VALVE REPLACEMENT (MVR) WITH 33 MM CARBOMEDICS OPTIFORM MECHANICAL VALVE (N/A) MINIMALLY INVASIVE MAZE PROCEDURE WITH APPLICATION OF ATRICLIP PRO 2 45 ON LAA (N/A) TRANSESOPHAGEAL ECHOCARDIOGRAM (TEE) (N/A) TRICUSPID VALVE REPAIR WITH EDWARDS MC3 TRICUSPID 28 MM ANNULOPLASTY RING MODEL 4900 (N/A)  Subjective: Feels better today.  Hungry for breakfast.  Soreness in right chest improved  Objective: Vital signs: BP Readings from Last 1 Encounters:  11/15/16 (!) 113/54   Pulse Readings from Last 1 Encounters:  11/15/16 80   Resp Readings from Last 1 Encounters:  11/15/16 (!) 21   Temp Readings from Last 1 Encounters:  11/15/16 97.4 F (36.3 C) (Oral)    Hemodynamics:   Mixed venous co-ox 67%   Physical Exam:  Rhythm:   Junctional 30-40 under pacer, DDD pacing - will not capture A-pacing  Breath sounds: Bibasilar rales L>R, symmetrical breath sounds  Heart sounds:  RRR w/ mechanical sounds, no murmur  Incisions:  Clean and dry  Abdomen:  Soft, non-distended, non-tender  Extremities:  Warm, well-perfused  Chest tubes:  decreasing volume thin serosanguinous output, no air leak    Intake/Output from previous day: 01/07 0701 - 01/08 0700 In: 1376.8 [P.O.:1000; I.V.:376.8] Out: 2620 [Urine:2300; Chest Tube:320] Intake/Output this shift: No intake/output data recorded.  Lab Results:  CBC: Recent Labs  11/14/16 0500 11/15/16 0530  WBC 16.8* 12.2*  HGB 10.3* 9.5*  HCT 31.1* 27.9*  PLT 102* 116*    BMET:  Recent Labs  11/14/16 0500 11/15/16 0530  NA 131* 133*  K 4.3 3.7  CL 93* 93*  CO2 31 33*  GLUCOSE 93 94  BUN 18 16  CREATININE 0.86 0.73  CALCIUM 8.0* 8.1*     PT/INR:   Recent Labs  11/15/16 0530  LABPROT 19.3*  INR 1.61    CBG (last 3)   Recent  Labs  11/14/16 1636 11/14/16 2015 11/15/16 0735  GLUCAP 123* 128* 95    ABG    Component Value Date/Time   PHART 7.332 (L) 11/12/2016 0506   PCO2ART 39.8 11/12/2016 0506   PO2ART 87.0 11/12/2016 0506   HCO3 21.4 11/12/2016 0506   TCO2 25 11/12/2016 1602   ACIDBASEDEF 5.0 (H) 11/12/2016 0506   O2SAT 67.1 11/15/2016 0540    CXR: PORTABLE CHEST 1 VIEW  COMPARISON:  11/14/2016, 11/13/2016, 11/12/2016  FINDINGS: Cardiomediastinal silhouette is unchanged, with postoperative changes of atrial appendage clipping, mitral valve and tricuspid valve repair.  Unchanged position of 2 left-sided pleural/mediastinal drains. Unchanged epicardial pacing leads.  Slight worsening of left upper lung airspace opacity with mild air bronchograms.  Improving right lower lobe interstitial and airspace opacity since the study dated 11/12/2016.  No visualized pneumothorax.  No large pleural effusion.  IMPRESSION: Since yesterday's chest x-ray there is slight worsening of airspace disease of the left upper lung.  There is continued improvement of the right lower lobe interstitial/airspace opacity from the study dated 11/12/2016.  Re- demonstration of 2 right-sided pleural/mediastinal drains. Unchanged epicardial pacing leads. No pneumothorax.  Surgical changes of atrial clipping, tricuspid and mitral valve repair.  Signed,  Yvone Neu. Loreta Ave, DO  [facility]   Assessment/Plan: S/P Procedure(s) (LRB): MINIMALLY INVASIVE MITRAL VALVE REPLACEMENT (MVR) WITH  33 MM CARBOMEDICS OPTIFORM MECHANICAL VALVE (N/A) MINIMALLY INVASIVE MAZE PROCEDURE WITH APPLICATION OF ATRICLIP PRO 2 45 ON LAA (N/A) TRANSESOPHAGEAL ECHOCARDIOGRAM (TEE) (N/A) TRICUSPID VALVE REPAIR WITH EDWARDS MC3 TRICUSPID 28 MM ANNULOPLASTY RING MODEL 4900 (N/A)  Overall doing well POD4 Remains pacer-dependent, slow junctional rhythm under pacer - will not capture  A-pacing Stable hemodynamics w/ increased co-ox on milrinone @ 0.25 Acute on chronic combined systolic and diastolic CHF with expected post-op volume excess, I/O's negative last 2 days but still 4-5 lbs > preop baseline - needs diuresis Expected post op acute blood loss anemia, Hgb down slightly 9.5 Expected post op atelectasis, mild Post op thrombocytopenia, Platelet count up 116k Chest tubes still draining but trending down INR trending up on warfarin and low dose lovenox and ASA   Continue to hold beta blockers, amiodarone  Decrease milrinone 0.2 today  Will ask Advanced Heart Failure team to get involved for long term follow up  Consult EPS - may need perm pacer  Mobilize  Diuresis - continue lasix IV bid for now  Watch platelet count and anemia  Leave chest tubes in for now  Transfer step down   Purcell Nails, MD 11/15/2016 8:38 AM

## 2016-11-15 NOTE — Progress Notes (Signed)
CARDIAC REHAB PHASE I   PRE:  Rate/Rhythm: AV paced 80  BP:  Supine:   Sitting: 114/35  Standing:    SaO2: 95% 3L  88%RA  MODE:  Ambulation: 150 ft   POST:  Rate/Rhythm: 80 paced  BP:  Supine:   Sitting: 111/55  Standing:    SaO2: 88-90% 2L, 94% 3L 1140-1210 Pt walked 150 ft on 2L with rolling walker and asst x1 with steady gait. Has been having burning right side but did not increase with walk. Put back on 3L after walk. Pt slightly SOB. To recliner with feet up and set up lunch. Encouraged two more walks.  Luetta Nutting, RN BSN  11/15/2016 12:05 PM

## 2016-11-16 ENCOUNTER — Encounter (HOSPITAL_COMMUNITY): Payer: Self-pay | Admitting: Nurse Practitioner

## 2016-11-16 ENCOUNTER — Inpatient Hospital Stay (HOSPITAL_COMMUNITY): Payer: BLUE CROSS/BLUE SHIELD

## 2016-11-16 DIAGNOSIS — Z954 Presence of other heart-valve replacement: Secondary | ICD-10-CM

## 2016-11-16 DIAGNOSIS — I5023 Acute on chronic systolic (congestive) heart failure: Secondary | ICD-10-CM

## 2016-11-16 DIAGNOSIS — I427 Cardiomyopathy due to drug and external agent: Secondary | ICD-10-CM

## 2016-11-16 DIAGNOSIS — I481 Persistent atrial fibrillation: Secondary | ICD-10-CM

## 2016-11-16 DIAGNOSIS — I498 Other specified cardiac arrhythmias: Secondary | ICD-10-CM

## 2016-11-16 LAB — CBC
HCT: 28.7 % — ABNORMAL LOW (ref 39.0–52.0)
Hemoglobin: 9.5 g/dL — ABNORMAL LOW (ref 13.0–17.0)
MCH: 29.8 pg (ref 26.0–34.0)
MCHC: 33.1 g/dL (ref 30.0–36.0)
MCV: 90 fL (ref 78.0–100.0)
PLATELETS: 144 10*3/uL — AB (ref 150–400)
RBC: 3.19 MIL/uL — ABNORMAL LOW (ref 4.22–5.81)
RDW: 15.1 % (ref 11.5–15.5)
WBC: 12.9 10*3/uL — AB (ref 4.0–10.5)

## 2016-11-16 LAB — ECHOCARDIOGRAM COMPLETE
Height: 70 in
WEIGHTICAEL: 2707.2 [oz_av]

## 2016-11-16 LAB — BASIC METABOLIC PANEL
ANION GAP: 6 (ref 5–15)
BUN: 17 mg/dL (ref 6–20)
CALCIUM: 8.3 mg/dL — AB (ref 8.9–10.3)
CO2: 32 mmol/L (ref 22–32)
CREATININE: 0.79 mg/dL (ref 0.61–1.24)
Chloride: 96 mmol/L — ABNORMAL LOW (ref 101–111)
GLUCOSE: 94 mg/dL (ref 65–99)
Potassium: 3.6 mmol/L (ref 3.5–5.1)
Sodium: 134 mmol/L — ABNORMAL LOW (ref 135–145)

## 2016-11-16 LAB — COOXEMETRY PANEL
CARBOXYHEMOGLOBIN: 1.8 % — AB (ref 0.5–1.5)
Methemoglobin: 0.9 % (ref 0.0–1.5)
O2 Saturation: 63.8 %
Total hemoglobin: 9.9 g/dL — ABNORMAL LOW (ref 12.0–16.0)

## 2016-11-16 LAB — PROTIME-INR
INR: 1.92
Prothrombin Time: 22.3 seconds — ABNORMAL HIGH (ref 11.4–15.2)

## 2016-11-16 MED ORDER — LISINOPRIL 2.5 MG PO TABS
2.5000 mg | ORAL_TABLET | Freq: Every day | ORAL | Status: DC
  Administered 2016-11-16: 2.5 mg via ORAL
  Filled 2016-11-16: qty 1

## 2016-11-16 MED ORDER — SPIRONOLACTONE 25 MG PO TABS
12.5000 mg | ORAL_TABLET | Freq: Every day | ORAL | Status: DC
  Administered 2016-11-16 – 2016-11-18 (×3): 12.5 mg via ORAL
  Filled 2016-11-16 (×3): qty 1

## 2016-11-16 MED ORDER — POTASSIUM CHLORIDE CRYS ER 20 MEQ PO TBCR
40.0000 meq | EXTENDED_RELEASE_TABLET | Freq: Once | ORAL | Status: AC
  Administered 2016-11-16: 40 meq via ORAL
  Filled 2016-11-16: qty 2

## 2016-11-16 NOTE — Progress Notes (Addendum)
5 Days Post-Op Procedure(s) (LRB): MINIMALLY INVASIVE MITRAL VALVE REPLACEMENT (MVR) WITH 33 MM CARBOMEDICS OPTIFORM MECHANICAL VALVE (N/A) MINIMALLY INVASIVE MAZE PROCEDURE WITH APPLICATION OF ATRICLIP PRO 2 45 ON LAA (N/A) TRANSESOPHAGEAL ECHOCARDIOGRAM (TEE) (N/A) TRICUSPID VALVE REPAIR WITH EDWARDS MC3 TRICUSPID 28 MM ANNULOPLASTY RING MODEL 4900 (N/A) Subjective: Feels pretty good , some incisional discomfort  Objective: Vital signs in last 24 hours: Temp:  [97.7 F (36.5 C)-98.6 F (37 C)] 97.7 F (36.5 C) (01/09 0455) Pulse Rate:  [80-96] 96 (01/09 0455) Cardiac Rhythm: A-V Sequential paced (01/08 2200) Resp:  [18-21] 18 (01/09 0455) BP: (113-125)/(56-62) 125/57 (01/09 0455) SpO2:  [91 %-100 %] 97 % (01/09 0455) Weight:  [169 lb 3.2 oz (76.7 kg)] 169 lb 3.2 oz (76.7 kg) (01/09 0503)  Hemodynamic parameters for last 24 hours:    Intake/Output from previous day: 01/08 0701 - 01/09 0700 In: 490.9 [P.O.:480; I.V.:10.9] Out: 1125 [Urine:850; Chest Tube:275] Intake/Output this shift: No intake/output data recorded.  General appearance: no distress Heart: regular rate and rhythm and no murmur, + mechanical click Lungs: right  basilar crackles Abdomen: mild distension, soft, non-tender Extremities: no edema Wound: incis healing well  Lab Results:  Recent Labs  11/15/16 0530 11/16/16 0351  WBC 12.2* 12.9*  HGB 9.5* 9.5*  HCT 27.9* 28.7*  PLT 116* 144*   BMET:  Recent Labs  11/15/16 0530 11/16/16 0351  NA 133* 134*  K 3.7 3.6  CL 93* 96*  CO2 33* 32  GLUCOSE 94 94  BUN 16 17  CREATININE 0.73 0.79  CALCIUM 8.1* 8.3*    PT/INR:  Recent Labs  11/16/16 0351  LABPROT 22.3*  INR 1.92   ABG    Component Value Date/Time   PHART 7.332 (L) 11/12/2016 0506   HCO3 21.4 11/12/2016 0506   TCO2 25 11/12/2016 1602   ACIDBASEDEF 5.0 (H) 11/12/2016 0506   O2SAT 63.8 11/16/2016 0400   CBG (last 3)   Recent Labs  11/14/16 2015 11/15/16 0735 11/15/16 1128   GLUCAP 128* 95 150*    Meds Scheduled Meds: . acetaminophen  1,000 mg Oral Q6H  . aspirin EC  81 mg Oral Daily  . bisacodyl  10 mg Oral Daily   Or  . bisacodyl  10 mg Rectal Daily  . diclofenac sodium  2 g Topical QID  . docusate sodium  200 mg Oral Daily  . enoxaparin (LOVENOX) injection  40 mg Subcutaneous Daily  . folic acid-pyridoxine-cyancobalamin  1 tablet Oral Daily  . furosemide  40 mg Intravenous BID  . gabapentin  300 mg Oral BID  . guaiFENesin  600 mg Oral BID  . iron polysaccharides  150 mg Oral Daily  . mouth rinse  15 mL Mouth Rinse BID  . pantoprazole  40 mg Oral Daily  . potassium chloride  20 mEq Oral BID  . sodium chloride flush  10-40 mL Intracatheter Q12H  . sodium chloride flush  3 mL Intravenous Q12H  . sodium chloride flush  3 mL Intravenous Q12H  . warfarin  5 mg Oral q1800  . Warfarin - Physician Dosing Inpatient   Does not apply q1800   Continuous Infusions: . sodium chloride Stopped (11/12/16 1400)  . milrinone 0.2 mcg/kg/min (11/15/16 1814)   PRN Meds:.sodium chloride, chlorpheniramine-HYDROcodone, metoprolol, ondansetron (ZOFRAN) IV, oxyCODONE, sodium chloride flush, sodium chloride flush, sodium chloride flush, sorbitol, traMADol  Xrays Dg Chest Port 1 View  Result Date: 11/15/2016 CLINICAL DATA:  61 year old male with a history of chest tube. Status  post mitral valve replacement. Surgery 11/11/2016, with minimally invasive mitral valve replacement, tricuspid valve repair, Maze procedure and left atrial appendage amputation/clip. EXAM: PORTABLE CHEST 1 VIEW COMPARISON:  11/14/2016, 11/13/2016, 11/12/2016 FINDINGS: Cardiomediastinal silhouette is unchanged, with postoperative changes of atrial appendage clipping, mitral valve and tricuspid valve repair. Unchanged position of 2 left-sided pleural/mediastinal drains. Unchanged epicardial pacing leads. Slight worsening of left upper lung airspace opacity with mild air bronchograms. Improving right lower  lobe interstitial and airspace opacity since the study dated 11/12/2016. No visualized pneumothorax.  No large pleural effusion. IMPRESSION: Since yesterday's chest x-ray there is slight worsening of airspace disease of the left upper lung. There is continued improvement of the right lower lobe interstitial/airspace opacity from the study dated 11/12/2016. Re- demonstration of 2 right-sided pleural/mediastinal drains. Unchanged epicardial pacing leads. No pneumothorax. Surgical changes of atrial clipping, tricuspid and mitral valve repair. Signed, Yvone Neu. Loreta Ave, DO [facility] Electronically Signed   By: Gilmer Mor D.O.   On: 11/15/2016 07:33    Assessment/Plan: S/P Procedure(s) (LRB): MINIMALLY INVASIVE MITRAL VALVE REPLACEMENT (MVR) WITH 33 MM CARBOMEDICS OPTIFORM MECHANICAL VALVE (N/A) MINIMALLY INVASIVE MAZE PROCEDURE WITH APPLICATION OF ATRICLIP PRO 2 45 ON LAA (N/A) TRANSESOPHAGEAL ECHOCARDIOGRAM (TEE) (N/A) TRICUSPID VALVE REPAIR WITH EDWARDS MC3 TRICUSPID 28 MM ANNULOPLASTY RING MODEL 4900 (N/A)  1 progressing well overall 2 brady into 30's under pacer - cont AV pacing/observation. EP to be consulted 3 CO-OX 63.8- milrinone may be able to be stopped soon 4 conts to diurese well, UO not accurate, cont current lasix for now, potassium replacement 5 cont current coumadin for mech valve 6 ABL anemiia is stable, minor stable leukocytosis, thrombocytopenia- resolved. On iron supplement 7 sugars well controlled 8 CT - 250 out yesterday, hopefully d/c soon  LOS: 5 days    GOLD,WAYNE E 11/16/2016  I have seen and examined the patient and agree with the assessment and plan as outlined.  Continue slow wean milrinone and start low dose ACE-I.  Continue diuresis.  Leave chest tubes in for now but may be able to d/c soon.  Check f/u ECHO.  May need permanent pacer - consider BiV  Purcell Nails, MD 11/16/2016 8:28 AM

## 2016-11-16 NOTE — Consult Note (Signed)
CARDIOLOGY CONSULT NOTE  Patient ID: SHADE KALEY MRN: [id number] DOB/AGE: February 25, 1956 61 y.o.  Admit date: 11/11/2016 Referring: Cornelius Moras Reason for Consultation: CHF/post-op management  HPI: 61 yo with history of heart murmur was admitted in 11/17 with atrial fibrillation/RVR.  He underwent TEE-guided DCCV back to NSR.  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  After that time, atrial fibrillation recurred.  He had left/right heart cath showing EF 30-35%, no significant coronary disease.  He saw Dr Cornelius Moras and plan was made for mitral valve surgery with Maze.  Surgery was done on 11/11/16 (right mini-thoracotomy).  Due to significant rheumatic involvement of the mitral valve, the valve was replaced with a mechanical valve rather than repaired.  He additionally had tricuspid valve repair and Maze with LA appendage clipping.    Post-op, he has had volume overload and persistent bradycardia with junctional rhythm.  He is generally feeling ok.  Today, co-ox 64% on milrinone 0.2.  He is getting IV Lasix and diuresing reasonably.  I turned down PPM backup rate to 45, his underlying rhythm is junctional at around 55 bpm.  He walked with cardiac rehab, only augmented rate into the lower 60s.  His atrial lead does not appear to capture.    Review of systems complete and found to be negative unless listed above in HPI  Past Medical History: 1. Atrial fibrillation: Paroxysmal.  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  Had TEE-guided DCCV in 11/17.  Atrial fibrillation recurred.  Had Maze with LA appendage clip on 11/11/16.  Now in junctional rhythm.  2. Rheumatic heart disease: Affected MV and likely TV with moderate to severe MR and moderate-severe TR.   - Right mini-thoracotomy with Carbomedics mechanical mitral valve and tricuspid valve repair on 11/11/16 . 3. Chronic systolic CHF: Nonischemic, likely related to valvular heart disease.  - TEE (11/17): EF 40-45%, moderate to severe  rheumatic MR.  - RHC/LHC (12/17): EF 30-35%, normal coronaries, normal filling pressures.   Family History  Problem Relation Age of Onset  . Cancer Mother   . Cancer Father     Social History   Social History  . Marital status: Married    Spouse name: N/A  . Number of children: N/A  . Years of education: N/A   Occupational History  . Not on file.   Social History Main Topics  . Smoking status: Former Smoker    Packs/day: 1.00    Years: 41.00    Types: Cigarettes    Quit date: 10/02/2016  . Smokeless tobacco: Former Neurosurgeon    Types: Chew    Quit date: 10/25/1997     Comment: RECENTLY QUIT SMOKING  . Alcohol use No  . Drug use: No  . Sexual activity: Not on file   Other Topics Concern  . Not on file   Social History Narrative   Patient is married for 4 adult children. 8 grandchildren.   Patient with a history of smoking one pack per day for 41 years.   Patient with a history of previous smokeless tobacco use but quit in 1998.   Patient denies use of alcohol or other illicit drugs.          Prescriptions Prior to Admission  Medication Sig Dispense Refill Last Dose  . acetaminophen (TYLENOL) 500 MG tablet Take 1,000 mg by mouth every 6 (six) hours as needed for moderate pain or headache.   Past Week at Unknown time  .  amiodarone (PACERONE) 200 MG tablet Take 2 tablets (400 mg total) by mouth daily. 180 tablet 3 11/11/2016 at 0430  . amoxicillin (AMOXIL) 500 MG capsule Take four capsules one hour before dental appointment. 4 capsule 3 Past Week at Unknown time  . apixaban (ELIQUIS) 5 MG TABS tablet Take 1 tablet (5 mg total) by mouth 2 (two) times daily. 60 tablet 12 11-03-16  . chlorhexidine (PERIDEX) 0.12 % solution Rinse with 15 mls twice daily for 30 seconds. Use after breakfast and at bedtime. Spit out excess. Do not swallow. 480 mL prn   . furosemide (LASIX) 20 MG tablet Take 1 tablet (20 mg total) by mouth daily. (Patient taking differently: Take 40 mg by mouth daily.  ) 30 tablet 12 11/10/2016 at Unknown time  . metoprolol (LOPRESSOR) 50 MG tablet Take 1 tablet (50 mg total) by mouth 2 (two) times daily. 60 tablet 12 11/11/2016 at 0430  . Multiple Vitamin (MULTIVITAMIN) tablet Take 1 tablet by mouth daily.   11/10/2016 at Unknown time   Current Scheduled Meds: . acetaminophen  1,000 mg Oral Q6H  . aspirin EC  81 mg Oral Daily  . bisacodyl  10 mg Oral Daily   Or  . bisacodyl  10 mg Rectal Daily  . diclofenac sodium  2 g Topical QID  . docusate sodium  200 mg Oral Daily  . folic acid-pyridoxine-cyancobalamin  1 tablet Oral Daily  . furosemide  40 mg Intravenous BID  . gabapentin  300 mg Oral BID  . guaiFENesin  600 mg Oral BID  . iron polysaccharides  150 mg Oral Daily  . lisinopril  2.5 mg Oral Daily  . mouth rinse  15 mL Mouth Rinse BID  . pantoprazole  40 mg Oral Daily  . potassium chloride  20 mEq Oral BID  . potassium chloride  40 mEq Oral Once  . sodium chloride flush  10-40 mL Intracatheter Q12H  . sodium chloride flush  3 mL Intravenous Q12H  . sodium chloride flush  3 mL Intravenous Q12H  . spironolactone  12.5 mg Oral Daily  . warfarin  5 mg Oral q1800  . Warfarin - Physician Dosing Inpatient   Does not apply q1800   Continuous Infusions: . sodium chloride Stopped (11/12/16 1400)  . milrinone 0.1 mcg/kg/min (11/16/16 0858)   PRN Meds:.sodium chloride, chlorpheniramine-HYDROcodone, metoprolol, ondansetron (ZOFRAN) IV, oxyCODONE, sodium chloride flush, sodium chloride flush, sodium chloride flush, sorbitol, traMADol   Physical exam Blood pressure (!) 125/57, pulse 96, temperature 97.7 F (36.5 C), temperature source Oral, resp. rate 18, height 5\' 10"  (1.778 m), weight 169 lb 3.2 oz (76.7 kg), SpO2 97 %. General: NAD Neck: JVP 10-12 cm, no thyromegaly or thyroid nodule.  Lungs: Decreased breath sounds at bases CV: Nondisplaced PMI.  Heart regular S1/S2, mechanical S1, no S3/S4, 1/6 SEM RUSB.  No peripheral edema.  No carotid bruit.  Normal  pedal pulses.  Abdomen: Soft, nontender, no hepatosplenomegaly, no distention.  Skin: Intact without lesions or rashes.  Neurologic: Alert and oriented x 3.  Psych: Normal affect. Extremities: No clubbing or cyanosis.  HEENT: Normal.   Labs:   Lab Results  Component Value Date   WBC 12.9 (H) 11/16/2016   HGB 9.5 (L) 11/16/2016   HCT 28.7 (L) 11/16/2016   MCV 90.0 11/16/2016   PLT 144 (L) 11/16/2016    Recent Labs Lab 11/16/16 0351  NA 134*  K 3.6  CL 96*  CO2 32  BUN 17  CREATININE 0.79  CALCIUM 8.3*  GLUCOSE 94  co-ox 64%  EKG: 1. ECG pre-op: Atrial fibrillation, narrow QRS 2. ECG 11/12/16: Junctional rhythm at 57 with iRBBB 3. ECG 11/16/16: Junctional rhythm at 55 => more likely biphasic T waves V4-V6 than long 1st degree AVB, long QT interval.   ASSESSMENT AND PLAN: 61 yo with history of rheumatic heart disease and atrial fibrillation now s/p mechanical MV replacement, TV repair, Maze + LA appendage clipping.  He is in a junctional rhythm persistently post-op.  1. Rheumatic heart disease: Moderate to severe MR and moderate to severe TR, symptomatic.  Now s/p mechanical MV replacement and TV repair.   - Echo today for baseline on valves.  - He is on warfarin and ASA 81 with mechanical MV.  2. Acute on chronic systolic CHF: TEE pre-op with EF 40-45% and LV-gram with EF 30-35%.  Post-op, he is volume overloaded on exam. Good co-ox at 64% on milrinone 0.2.  - Decrease milrinone to 0.1 today, stop tomorrow if co-ox remains acceptable.  - Continue Lasix 40 mg IV bid.  He is diuresing well so far. Replace K.  - Add lisinopril 2.5 daily and spironolactone 12.5 daily.  - Echo today to assess post-op EF.  3. Junctional rhythm: Patient is in a junctional rhythm at around 55 with chronotropic incompetence (HR only rose to 63 walking halls with cardiac rehab today).  Unable to capture atrium using the temporary wires.   - Would watch for a day or 2 longer, check rhythm daily.  Will  turn rate back up to 70.   - If EF is low, would favor a biventricular defibrillator.  EP has seen and will follow.  4. Atrial fibrillation: Now s/p Maze and LA appendage clipping.  See discussion above of rhythm.  He is off all nodal blockers.   Marca Ancona 11/16/2016 12:37 PM

## 2016-11-16 NOTE — Progress Notes (Signed)
CARDIAC REHAB PHASE I   PRE:  Rate/Rhythm: 55 ? JR  BP:  Supine: 107/55  Sitting:   Standing:    SaO2: 95% 3L  MODE:  Ambulation: 350 ft   POST:  Rate/Rhythm: 63 ? JR  BP:  Supine:   Sitting: 128/55  Standing:    SaO2: 89-90% 3L 1007-1045 Pt walked 350 ft on 3L with rolling walker,asst x 1 and gait belt use. Tolerated well with no dizziness with pacer turned off. Heart rate 63 when we returned to room. Sats at 89-90% on 3L. NT in to get EKG after walk. Pt in recliner on 3L with call bell. Chest tube to suction.   Luetta Nutting, RN BSN  11/16/2016 10:41 AM

## 2016-11-16 NOTE — Discharge Instructions (Addendum)
Supplemental Discharge Instructions for  Pacemaker/Defibrillator Patients  Activity No heavy lifting or vigorous activity with your left/right arm for 6 to 8 weeks.  Do not raise your left/right arm above your head for one week.  Gradually raise your affected arm as drawn below.             11/22/16                     11/23/16                     11/24/16                  11/25/16 __  NO DRIVING until cleared to by surgeon  WOUND CARE - Keep the pacemaker wound area clean and dry.  Do not get this area wet for one week. No showers for one week; you may shower on 11/26/15, or longer if post heart surgery wound restrictions are longer  . - The tape/steri-strips on your wound will fall off; do not pull them off.  No bandage is needed on the site.  DO  NOT apply any creams, oils, or ointments to the wound area. - If you notice any drainage or discharge from the wound, any swelling or bruising at the site, or you develop a fever > 101? F after you are discharged home, call the office at once.  Special Instructions - You are still able to use cellular telephones; use the ear opposite the side where you have your pacemaker/defibrillator.  Avoid carrying your cellular phone near your device. - When traveling through airports, show security personnel your identification card to avoid being screened in the metal detectors.  Ask the security personnel to use the hand wand. - Avoid arc welding equipment, MRI testing (magnetic resonance imaging), TENS units (transcutaneous nerve stimulators).  Call the office for questions about other devices. - Avoid electrical appliances that are in poor condition or are not properly grounded. - Microwave ovens are safe to be near or to operate.  Additional information for defibrillator patients should your device go off: - If your device goes off ONCE and you feel fine afterward, notify the device clinic nurses. - If your device goes off ONCE and you do not feel  well afterward, call 911. - If your device goes off TWICE, call 911. - If your device goes off THREE times in one day, call 911.  DO NOT DRIVE YOURSELF OR A FAMILY MEMBER WITH A DEFIBRILLATOR TO THE HOSPITAL--CALL 911.       Information on my medicine - Coumadin   (Warfarin)  This medication education was reviewed with me or my healthcare representative as part of my discharge preparation.    Why was Coumadin prescribed for you? Coumadin was prescribed for you because you have a blood clot or a medical condition that can cause an increased risk of forming blood clots. Blood clots can cause serious health problems by blocking the flow of blood to the heart, lung, or brain. Coumadin can prevent harmful blood clots from forming. As a reminder your indication for Coumadin is:   Blood Clot Prevention After Heart Valve Surgery  What test will check on my response to Coumadin? While on Coumadin (warfarin) you will need to have an INR test regularly to ensure that your dose is keeping you in the desired range. The INR (international normalized ratio) number is calculated from the result of the laboratory test  called prothrombin time (PT).  If an INR APPOINTMENT HAS NOT ALREADY BEEN MADE FOR YOU please schedule an appointment to have this lab work done by your health care provider within 7 days. Your INR goal is usually a number between:  2 to 3 or your provider may give you a more narrow range like 2-2.5.  Ask your health care provider during an office visit what your goal INR is.  What  do you need to  know  About  COUMADIN? Take Coumadin (warfarin) exactly as prescribed by your healthcare provider about the same time each day.  DO NOT stop taking without talking to the doctor who prescribed the medication.  Stopping without other blood clot prevention medication to take the place of Coumadin may increase your risk of developing a new clot or stroke.  Get refills before you run out.  What do  you do if you miss a dose? If you miss a dose, take it as soon as you remember on the same day then continue your regularly scheduled regimen the next day.  Do not take two doses of Coumadin at the same time.  Important Safety Information A possible side effect of Coumadin (Warfarin) is an increased risk of bleeding. You should call your healthcare provider right away if you experience any of the following: ? Bleeding from an injury or your nose that does not stop. ? Unusual colored urine (red or dark brown) or unusual colored stools (red or black). ? Unusual bruising for unknown reasons. ? A serious fall or if you hit your head (even if there is no bleeding).  Some foods or medicines interact with Coumadin (warfarin) and might alter your response to warfarin. To help avoid this: ? Eat a balanced diet, maintaining a consistent amount of Vitamin K. ? Notify your provider about major diet changes you plan to make. ? Avoid alcohol or limit your intake to 1 drink for women and 2 drinks for men per day. (1 drink is 5 oz. wine, 12 oz. beer, or 1.5 oz. liquor.)  Make sure that ANY health care provider who prescribes medication for you knows that you are taking Coumadin (warfarin).  Also make sure the healthcare provider who is monitoring your Coumadin knows when you have started a new medication including herbals and non-prescription products.  Coumadin (Warfarin)  Major Drug Interactions  Increased Warfarin Effect Decreased Warfarin Effect  Alcohol (large quantities) Antibiotics (esp. Septra/Bactrim, Flagyl, Cipro) Amiodarone (Cordarone) Aspirin (ASA) Cimetidine (Tagamet) Megestrol (Megace) NSAIDs (ibuprofen, naproxen, etc.) Piroxicam (Feldene) Propafenone (Rythmol SR) Propranolol (Inderal) Isoniazid (INH) Posaconazole (Noxafil) Barbiturates (Phenobarbital) Carbamazepine (Tegretol) Chlordiazepoxide (Librium) Cholestyramine (Questran) Griseofulvin Oral  Contraceptives Rifampin Sucralfate (Carafate) Vitamin K   Coumadin (Warfarin) Major Herbal Interactions  Increased Warfarin Effect Decreased Warfarin Effect  Garlic Ginseng Ginkgo biloba Coenzyme Q10 Green tea St. Johns wort    Coumadin (Warfarin) FOOD Interactions  Eat a consistent number of servings per week of foods HIGH in Vitamin K (1 serving =  cup)  Collards (cooked, or boiled & drained) Kale (cooked, or boiled & drained) Mustard greens (cooked, or boiled & drained) Parsley *serving size only =  cup Spinach (cooked, or boiled & drained) Swiss chard (cooked, or boiled & drained) Turnip greens (cooked, or boiled & drained)  Eat a consistent number of servings per week of foods MEDIUM-HIGH in Vitamin K (1 serving = 1 cup)  Asparagus (cooked, or boiled & drained) Broccoli (cooked, boiled & drained, or raw & chopped) Brussel sprouts (cooked,  or boiled & drained) *serving size only =  cup Lettuce, raw (green leaf, endive, romaine) Spinach, raw Turnip greens, raw & chopped   These websites have more information on Coumadin (warfarin):  http://www.king-russell.com/; https://www.hines.net/;   Mitral Valve Replacement, Care After This sheet gives you information about how to care for yourself after your procedure. Your health care provider may also give you more specific instructions. If you have problems or questions, contact your health care provider. What can I expect after the procedure? After the procedure, it is common to have:  Pain at the incision area that may last for several weeks. Follow these instructions at home: Incision care  Follow instructions from your health care provider about how to take care of your incision. Make sure you:  Wash your hands with soap and water before you change your bandage (dressing). If soap and water are not available, use hand sanitizer.  Change your dressing as told by your health care provider.  Leave stitches  (sutures), skin glue, or adhesive strips in place. These skin closures may need to stay in place for 2 weeks or longer. If adhesive strip edges start to loosen and curl up, you may trim the loose edges. Do not remove adhesive strips completely unless your health care provider tells you to do that.  Check your incision area every day for signs of infection. Check for:  More redness, swelling, or pain.  More fluid or blood.  Warmth.  Pus or a bad smell.  Do not apply powder or lotion to the area. Driving  Do not drive until your health care provider approves.  Do not drive or use heavy machinery while taking prescription pain medicines. Bathing  Do not take baths, swim, or use a hot tub for 2-4 weeks after surgery, or until your health care provider approves. Ask your health care provider if you may take showers.  To wash the incision site, gently wash with soap and water and pat the area dry with a clean towel. Do not rub the incision area. That may cause bleeding. Activity  Rest as told by your health care provider. Ask your health care provider when you can resume normal activities, including sexual activity.  Avoid the following activities for 6-8 weeks, or as long as directed:  Lifting anything that is heavier than 10 lb (4.5 kg), or the limit that your health care provider tells you.  Pushing or pulling things with your arms.  Avoid climbing stairs and using the handrail to pull yourself up for the first 2-3 weeks after surgery.  Avoid airplane travel for 4-6 weeks, or as long as directed.  Avoid sitting for long periods of time and crossing your legs. Get up and move around at least once every 1-2 hours.  If you are taking blood thinners (anticoagulants), avoid activities that have a high risk of injury. Ask your health care provider what activities are safe for you. Lifestyle  Limit alcohol intake to no more than 1 drink a day for nonpregnant women and 2 drinks a day  for men. One drink equals 12 oz of beer, 5 oz of wine, or 1 oz of hard liquor.  Do not use any products that contain nicotine or tobacco, such as cigarettes and e-cigarettes. If you need help quitting, ask your health care provider. General instructions  Take your temperature every day and weigh yourself every morning for the first 7 days after surgery. Write your temperatures and weight down and take this  record with you to any follow-up visits.  Take over-the-counter and prescription medicines only as told by your health care provider.  To prevent or treat constipation while you are taking prescription pain medicine, your health care provider may recommend that you:  Drink enough fluid to keep your urine clear or pale yellow.  Take over-the-counter or prescription medicines.  Eat foods that are high in fiber, such as fresh fruits and vegetables, whole grains, and beans.  Limit foods that are high in fat and processed sugars, such as fried and sweet foods.  Follow instructions from your health care provider about eating or drinking restrictions.  Wear compression stockings for at least 2 weeks, or as long as told by your health care provider. These stockings help to prevent blood clots and reduce swelling in your legs. If your ankles are swollen after 2 weeks, continue to wear the stockings.  Keep all follow-up visits as told by your health care provider. This is important. Contact a health care provider if:  You develop a skin rash.  Your weight is increasing each day over 2-3 days.  You gain 2 lb (1 kg) or more in a single day.  You have a fever. Get help right away if:  You develop chest pain that feels different from the pain caused by your incision.  You develop shortness of breath or difficulty breathing.  You have more redness, swelling, or pain around your incision.  You have more fluid or blood coming from your incision.  Your incision feels warm to the  touch.  You have pus or a bad smell coming from your incision.  You feel light-headed. This information is not intended to replace advice given to you by your health care provider. Make sure you discuss any questions you have with your health care provider. Document Released: 05/14/2005 Document Revised: 08/06/2016 Document Reviewed: 08/06/2016 Elsevier Interactive Patient Education  2017 [employer].

## 2016-11-16 NOTE — Progress Notes (Signed)
  Echocardiogram 2D Echocardiogram has been performed.  Chee Kinslow 11/16/2016, 11:25 AM
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
## 2016-11-17 ENCOUNTER — Inpatient Hospital Stay (HOSPITAL_COMMUNITY): Payer: BLUE CROSS/BLUE SHIELD

## 2016-11-17 LAB — BASIC METABOLIC PANEL
ANION GAP: 9 (ref 5–15)
BUN: 19 mg/dL (ref 6–20)
CALCIUM: 8.5 mg/dL — AB (ref 8.9–10.3)
CO2: 31 mmol/L (ref 22–32)
Chloride: 95 mmol/L — ABNORMAL LOW (ref 101–111)
Creatinine, Ser: 0.68 mg/dL (ref 0.61–1.24)
GFR calc Af Amer: >60 mL/min (ref >60)
GLUCOSE: 103 mg/dL — AB (ref 65–99)
POTASSIUM: 3.9 mmol/L (ref 3.5–5.1)
SODIUM: 135 mmol/L (ref 135–145)

## 2016-11-17 LAB — CBC
HEMATOCRIT: 30.8 % — AB (ref 39.0–52.0)
HEMOGLOBIN: 10.2 g/dL — AB (ref 13.0–17.0)
MCH: 30.1 pg (ref 26.0–34.0)
MCHC: 33.1 g/dL (ref 30.0–36.0)
MCV: 90.9 fL (ref 78.0–100.0)
Platelets: 229 10*3/uL (ref 150–400)
RBC: 3.39 MIL/uL — ABNORMAL LOW (ref 4.22–5.81)
RDW: 15.4 % (ref 11.5–15.5)
WBC: 13.1 10*3/uL — AB (ref 4.0–10.5)

## 2016-11-17 LAB — COOXEMETRY PANEL
CARBOXYHEMOGLOBIN: 1.2 % (ref 0.5–1.5)
METHEMOGLOBIN: 1.1 % (ref 0.0–1.5)
O2 Saturation: 53 %
Total hemoglobin: 11 g/dL — ABNORMAL LOW (ref 12.0–16.0)

## 2016-11-17 LAB — PROTIME-INR
INR: 2.16
Prothrombin Time: 24.4 seconds — ABNORMAL HIGH (ref 11.4–15.2)

## 2016-11-17 MED ORDER — LISINOPRIL 2.5 MG PO TABS
2.5000 mg | ORAL_TABLET | Freq: Two times a day (BID) | ORAL | Status: DC
  Administered 2016-11-17 – 2016-11-20 (×7): 2.5 mg via ORAL
  Filled 2016-11-17 (×7): qty 1

## 2016-11-17 MED ORDER — CEFAZOLIN SODIUM-DEXTROSE 2-4 GM/100ML-% IV SOLN
2.0000 g | INTRAVENOUS | Status: AC
  Administered 2016-11-18: 2 g via INTRAVENOUS
  Filled 2016-11-17: qty 100

## 2016-11-17 MED ORDER — SODIUM CHLORIDE 0.9 % IV SOLN: INTRAVENOUS | Status: DC

## 2016-11-17 MED ORDER — GENTAMICIN SULFATE 40 MG/ML IJ SOLN
80.0000 mg | INTRAMUSCULAR | Status: AC
  Filled 2016-11-17: qty 2

## 2016-11-17 MED ORDER — CHLORHEXIDINE GLUCONATE 4 % EX LIQD
60.0000 mL | Freq: Once | CUTANEOUS | Status: AC
  Administered 2016-11-18: 4 via TOPICAL
  Filled 2016-11-17: qty 60

## 2016-11-17 MED ORDER — CHLORHEXIDINE GLUCONATE 4 % EX LIQD
60.0000 mL | Freq: Once | CUTANEOUS | Status: AC
  Administered 2016-11-17: 4 via TOPICAL
  Filled 2016-11-17: qty 60

## 2016-11-17 NOTE — Progress Notes (Signed)
Chest Tube d/c'd per order and per protocol. Pt tolerated well. Pt instructed to let RN know if his side felt damp or wet at all. Call bell and phone within reach. Daughter at bedside. Will continue to monitor.

## 2016-11-17 NOTE — Progress Notes (Signed)
SUBJECTIVE: The patient is doing well today.  At this time, he denies chest pain, shortness of breath, or any new concerns.  CURRENT MEDICATIONS: . aspirin EC  81 mg Oral Daily  . bisacodyl  10 mg Oral Daily   Or  . bisacodyl  10 mg Rectal Daily  . diclofenac sodium  2 g Topical QID  . docusate sodium  200 mg Oral Daily  . folic acid-pyridoxine-cyancobalamin  1 tablet Oral Daily  . furosemide  40 mg Intravenous BID  . gabapentin  300 mg Oral BID  . guaiFENesin  600 mg Oral BID  . iron polysaccharides  150 mg Oral Daily  . lisinopril  2.5 mg Oral Daily  . mouth rinse  15 mL Mouth Rinse BID  . pantoprazole  40 mg Oral Daily  . potassium chloride  20 mEq Oral BID  . sodium chloride flush  10-40 mL Intracatheter Q12H  . sodium chloride flush  3 mL Intravenous Q12H  . sodium chloride flush  3 mL Intravenous Q12H  . spironolactone  12.5 mg Oral Daily  . warfarin  5 mg Oral q1800  . Warfarin - Physician Dosing Inpatient   Does not apply q1800   . sodium chloride Stopped (11/12/16 1400)  . milrinone 0.1 mcg/kg/min (11/16/16 1815)    OBJECTIVE: Physical Exam: Vitals:   11/16/16 1800 11/16/16 1819 11/16/16 2021 11/17/16 0442  BP:   (!) 117/48 136/69  Pulse:   70 70  Resp:   20 20  Temp:   97.6 F (36.4 C) 97.8 F (36.6 C)  TempSrc:   Oral Oral  SpO2: (!) 87% 93% 97% 95%  Weight:    166 lb 8 oz (75.5 kg)  Height:        Intake/Output Summary (Last 24 hours) at 11/17/16 5701 Last data filed at 11/17/16 0500  Gross per 24 hour  Intake              490 ml  Output             1300 ml  Net             -810 ml    Telemetry reveals AV pacing  GEN- The patient is well appearing, alert and oriented x 3 today.   Head- normocephalic, atraumatic Eyes-  Sclera clear, conjunctiva pink Ears- hearing intact Oropharynx- clear Neck- supple  Lungs- Clear to ausculation bilaterally, normal work of breathing, +right CT Heart- Regular rate and rhythm, +mechanical valve GI- soft,  NT, ND, + BS Extremities- no clubbing, cyanosis, or edema Skin- no rash or lesion Psych- euthymic mood, full affect Neuro- strength and sensation are intact  LABS: Basic Metabolic Panel:  Recent Labs  77/93/90 0530 11/16/16 0351  NA 133* 134*  K 3.7 3.6  CL 93* 96*  CO2 33* 32  GLUCOSE 94 94  BUN 16 17  CREATININE 0.73 0.79  CALCIUM 8.1* 8.3*  CBC:  Recent Labs  11/15/16 0530 11/16/16 0351  WBC 12.2* 12.9*  HGB 9.5* 9.5*  HCT 27.9* 28.7*  MCV 91.2 90.0  PLT 116* 144*   ASSESSMENT AND PLAN:  Principal Problem:   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  Junctional rhythm post MVR/TVR/MAZE The patient has had persistent junctional rhythm now POD 6  Ef 40-45% by echo yesterday When ambulating, the patient had chronotropic incompetence Would anticipate CRTP when patient nearing discharge. If able to remove chest tube today and wean Milrinone, could potentially do tomorrow.   2.  Persistent atrial fibrillation S/p MAZE this admission Continue anticoagulation for CHADS2VASC of 1  3.  Chronic systolic heart failure Continue medical therapy  CRT as above  Gypsy Balsam, NP 11/17/2016 6:44 AM  EP Attending  Patient continues to progress. Agree with above. He appears euvolemic. I cannot tell if his atrial pacing lead is capturing or not. Either way would plan to place BiV PPM as patient's repeat EF 40%. Hopefully that will get better with time and if we can maintain NSR.  Might consider a couple of months of amiodarone at discharge. We can place his PPM tomorrow once chest tube is out.   Leonia Reeves.D.

## 2016-11-17 NOTE — Progress Notes (Signed)
CARDIAC REHAB PHASE I   PRE:  Rate/Rhythm: 56 ?JR  BP:  Supine:   Sitting: 131/61  Standing:    SaO2: 95%2L  MODE:  Ambulation: 550 ft   POST:  Rate/Rhythm: 66 ?JR  BP:  Supine:   Sitting: 159/70  Standing:    SaO2: 86% 2L  With rest 92% on 2L 1010-1048 Pt walked 550 ft on 2L with rolling walker and asst x 1. Pt tolerated well. Desat on 2L. Quickly to 92% with rest. Tolerated well. Back to bed and left on 2L. Encouraged IS and pt demonstrated correct use and reaching about .. For CT removal today.   Luetta Nutting, RN BSN  11/17/2016 10:42 AM

## 2016-11-17 NOTE — Progress Notes (Signed)
Tele monitors called saying pts HR dipped into 30s. Pt states he feels "tired". Pts external pacer turned up to VVI at 60.

## 2016-11-17 NOTE — Progress Notes (Signed)
Patient ID: [name] [name], male   DOB: June 23, 1956, 61 y.o.   MRN: [id number]   SUBJECTIVE: Feels good this morning.  I/Os not well-recorded but weight down.  Breathing stable.    Pacemaker rate decreased, underlying rhythm still appears to be junctional in the upper 50s.   Echo (11/16/16): EF 40%, mild LVH, diffuse hypokinesis, mechanical mitral valve not fully visualized.   Scheduled Meds: . aspirin EC  81 mg Oral Daily  . bisacodyl  10 mg Oral Daily   Or  . bisacodyl  10 mg Rectal Daily  . diclofenac sodium  2 g Topical QID  . docusate sodium  200 mg Oral Daily  . folic acid-pyridoxine-cyancobalamin  1 tablet Oral Daily  . furosemide  40 mg Intravenous BID  . gabapentin  300 mg Oral BID  . guaiFENesin  600 mg Oral BID  . iron polysaccharides  150 mg Oral Daily  . lisinopril  2.5 mg Oral BID  . mouth rinse  15 mL Mouth Rinse BID  . pantoprazole  40 mg Oral Daily  . potassium chloride  20 mEq Oral BID  . sodium chloride flush  10-40 mL Intracatheter Q12H  . sodium chloride flush  3 mL Intravenous Q12H  . sodium chloride flush  3 mL Intravenous Q12H  . spironolactone  12.5 mg Oral Daily  . warfarin  5 mg Oral q1800  . Warfarin - Physician Dosing Inpatient   Does not apply q1800   Continuous Infusions: . sodium chloride Stopped (11/12/16 1400)   PRN Meds:.sodium chloride, chlorpheniramine-HYDROcodone, metoprolol, ondansetron (ZOFRAN) IV, oxyCODONE, sodium chloride flush, sodium chloride flush, sodium chloride flush, sorbitol, traMADol    Vitals:   11/16/16 1800 11/16/16 1819 11/16/16 2021 11/17/16 0442  BP:   (!) 117/48 136/69  Pulse:   70 70  Resp:   20 20  Temp:   97.6 F (36.4 C) 97.8 F (36.6 C)  TempSrc:   Oral Oral  SpO2: (!) 87% 93% 97% 95%  Weight:    166 lb 8 oz (75.5 kg)  Height:        Intake/Output Summary (Last 24 hours) at 11/17/16 0820 Last data filed at 11/17/16 0500  Gross per 24 hour  Intake              250 ml  Output             1300 ml  Net             -1050 ml    LABS: Basic Metabolic Panel:  Recent Labs  40/98/11 0530 11/16/16 0351  NA 133* 134*  K 3.7 3.6  CL 93* 96*  CO2 33* 32  GLUCOSE 94 94  BUN 16 17  CREATININE 0.73 0.79  CALCIUM 8.1* 8.3*   Liver Function Tests: No results for input(s): AST, ALT, ALKPHOS, BILITOT, PROT, ALBUMIN in the last 72 hours. No results for input(s): LIPASE, AMYLASE in the last 72 hours. CBC:  Recent Labs  11/15/16 0530 11/16/16 0351  WBC 12.2* 12.9*  HGB 9.5* 9.5*  HCT 27.9* 28.7*  MCV 91.2 90.0  PLT 116* 144*   Cardiac Enzymes: No results for input(s): CKTOTAL, CKMB, CKMBINDEX, TROPONINI in the last 72 hours. BNP: Invalid input(s): POCBNP D-Dimer: No results for input(s): DDIMER in the last 72 hours. Hemoglobin A1C: No results for input(s): HGBA1C in the last 72 hours. Fasting Lipid Panel: No results for input(s): CHOL, HDL, LDLCALC, TRIG, CHOLHDL, LDLDIRECT in the last 72 hours. Thyroid Function  Tests: No results for input(s): TSH, T4TOTAL, T3FREE, THYROIDAB in the last 72 hours.  Invalid input(s): FREET3 Anemia Panel: No results for input(s): VITAMINB12, FOLATE, FERRITIN, TIBC, IRON, RETICCTPCT in the last 72 hours.  RADIOLOGY: Dg Chest 2 View  Addendum Date: 11/09/2016   ADDENDUM REPORT: 11/09/2016 14:56 ADDENDUM: Review of a CT scan performed on November 02, 2016, on [name] but reported under a different medical record number, reveals no abnormality in the area of clinical concern. Thus the CT scan that I recommended to be done prior to the anticipated surgical procedure is not necessary. I apologize for the confusion that this has caused. Electronically Signed   By: David  [place] M.D.   On: 11/09/2016 14:56   Result Date: 11/09/2016 CLINICAL DATA:  Preoperative examination prior mitral valve surgery. Discontinued smoking 1 month ago. Atrial fibrillation, CHF. EXAM: CHEST  2 VIEW COMPARISON:  None in PACs FINDINGS: The lungs are adequately inflated. There is  subtle increased density in the retrosternal region inferiorly which may lie on the right. The left lung is clear. The cardiac silhouette is mildly enlarged. The pulmonary vascularity is normal. The mediastinum is normal in width. The bony thorax exhibits no acute abnormality. IMPRESSION: Mild right-sided infrahilar/retrosternal soft tissue prominence that that may reflect atelectasis or pneumonia or a soft tissue mass. Chest CT scanning is recommended prior the patient's surgical procedure. Mild cardiomegaly without pulmonary edema.  No pleural effusion. These results will be called to the ordering clinician or representative by the Radiologist Assistant, and communication documented in the PACS or zVision Dashboard. Electronically Signed: By: David  [place] M.D. On: 11/09/2016 10:13   Dg Chest Port 1 View  Result Date: 11/15/2016 CLINICAL DATA:  61 year old male with a history of chest tube. Status post mitral valve replacement. Surgery 11/11/2016, with minimally invasive mitral valve replacement, tricuspid valve repair, Maze procedure and left atrial appendage amputation/clip. EXAM: PORTABLE CHEST 1 VIEW COMPARISON:  11/14/2016, 11/13/2016, 11/12/2016 FINDINGS: Cardiomediastinal silhouette is unchanged, with postoperative changes of atrial appendage clipping, mitral valve and tricuspid valve repair. Unchanged position of 2 left-sided pleural/mediastinal drains. Unchanged epicardial pacing leads. Slight worsening of left upper lung airspace opacity with mild air bronchograms. Improving right lower lobe interstitial and airspace opacity since the study dated 11/12/2016. No visualized pneumothorax.  No large pleural effusion. IMPRESSION: Since yesterday's chest x-ray there is slight worsening of airspace disease of the left upper lung. There is continued improvement of the right lower lobe interstitial/airspace opacity from the study dated 11/12/2016. Re- demonstration of 2 right-sided pleural/mediastinal drains.  Unchanged epicardial pacing leads. No pneumothorax. Surgical changes of atrial clipping, tricuspid and mitral valve repair. Signed, Yvone Neu. Loreta Ave, DO [facility] Radiology Electronically Signed   By: Gilmer Mor D.O.   On: 11/15/2016 07:33   Dg Chest Port 1 View  Result Date: 11/14/2016 CLINICAL DATA:  Status post mitral and tricuspid valve replacement and Maze procedure EXAM: PORTABLE CHEST 1 VIEW COMPARISON:  11/13/2016 FINDINGS: Postsurgical changes are again seen and stable. Right-sided PICC line is again identified and has been withdrawn to the cavoatrial junction. Right thoracostomy catheter and pericardial drain are again noted and stable. Diffuse increased density is noted throughout both lungs stable from the prior exam. No new focal infiltrate is noted. No pneumothorax is seen. IMPRESSION: Postsurgical changes. Tubes and lines as described. Patchy opacities throughout both lungs relatively stable from the prior exam likely representing a degree of edema. Continued follow-up is recommended. Electronically Signed  By: Alcide Clever M.D.   On: 11/14/2016 07:16   Dg Chest Port 1 View  Addendum Date: 11/13/2016   ADDENDUM REPORT: 11/13/2016 13:04 ADDENDUM: Tip of PICC is 4.5 cm distal to the superior cavoatrial junction. Electronically Signed   By: Trudie Reed M.D.   On: 11/13/2016 13:04   Result Date: 11/13/2016 CLINICAL DATA:  61 year old male status post central line placement. EXAM: PORTABLE CHEST 1 VIEW COMPARISON:  Chest x-ray 11/13/2016. FINDINGS: Left IJ Cordis with tip terminating in the left innominate vein. Right upper extremity PICC with tip terminating in the right atrium. Two right-sided chest tubes in position, what with tip near the apex of the right hemithorax and other with tip projecting slightly to the left of midline (likely projectional). No appreciable right-sided pneumothorax. Lung volumes are low. Widespread interstitial  and airspace disease is noted throughout the lungs bilaterally, asymmetrically distributed involving the right mid to lower lung, and relatively diffuse involvement of the left lung, with sparing of the right upper lobe. Pulmonary vasculature does not appear engorged. No definite pleural effusions. Heart size is mildly enlarged. Status post mechanical mitral valve replacements and tricuspid annuloplasty. Status post left atrial appendage ligation with left atrial clip in place. IMPRESSION: 1. Postoperative changes and support apparatus, as above. 2. Persistent patchy multifocal asymmetrically distributed interstitial and airspace disease throughout the lungs bilaterally. Findings could reflect asymmetric noncardiogenic edema, or could indicate infectious consolidation. Attention on followup studies is recommended. 3. Mild cardiomegaly. Electronically Signed: By: Trudie Reed M.D. On: 11/13/2016 12:47   Dg Chest Port 1 View  Result Date: 11/13/2016 CLINICAL DATA:  Atelectasis. Short of breath. Subsequent encounter. EXAM: PORTABLE CHEST 1 VIEW COMPARISON:  11/12/2016 and older exams FINDINGS: There is new patchy airspace opacity in the left mid to upper lung. Consolidation in the right mid to lower lung is stable. No pneumothorax. Two right-sided chest tubes are stable. Swan-Ganz catheter has been removed. The left internal jugular Cordis remains in place. There is no mediastinal widening. Changes from the recent surgery and cardiac valve replacement are stable. IMPRESSION: 1. Worsening lung aeration with new airspace opacity developing in the left mid to upper lung. This may reflect asymmetric edema. Pneumonia or aspiration pneumonitis is possible. 2. Status post removal of the Swan-Ganz catheter. Left internal jugular introducer sheath remains in place. 3. No other change. 4. Stable right chest tubes. No pneumothorax. Persistent right mid to lower lung zone airspace opacity. Persistent medial left lung base  atelectasis. Electronically Signed   By: Amie Portland M.D.   On: 11/13/2016 07:56   Dg Chest Port 1 View  Result Date: 11/12/2016 CLINICAL DATA:  Cardiac surgery, chest tube EXAM: PORTABLE CHEST 1 VIEW COMPARISON:  Chest radiograph from one day prior. FINDINGS: Right apical chest tube is in place. A stable separate tube terminates over the left lower mediastinum. Interval extubation. Left internal jugular Swan-Ganz catheter terminates over the right pulmonary artery. Cardiac valvular prostheses are in place. Stable cardiomediastinal silhouette with mild cardiomegaly. No pneumothorax. No pleural effusion. There is worsening patchy consolidation at the right greater than left lung bases. IMPRESSION: 1. Support structures as described.  No pneumothorax. 2. Stable mild cardiomegaly. Worsening patchy consolidation at the right greater than left lung bases, for which the differential includes worsening atelectasis, aspiration and/or pneumonia. Electronically Signed   By: Delbert Phenix M.D.   On: 11/12/2016 07:37   Dg Chest Port 1 View  Result Date: 11/11/2016 CLINICAL DATA:  Atelectasis, atrial fibrillation, dyspnea, exertional  angina, heart murmur, non ischemic cardiomyopathy, palpitations, 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 Nasogastric tube extends into stomach. LEFT jugular Swan-Ganz catheter tip projects over proximal descending interlobar pulmonary artery. Epicardial pacing wires present. Mediastinal drain and RIGHT thoracostomy tube present. Enlargement of cardiac silhouette post MVR and TVR. LEFT atrial appendage clip noted. Mild pulmonary vascular congestion. Probable RIGHT perihilar edema with small RIGHT pleural effusion and mild mid lung atelectasis. No definite pneumothorax. Osseous structures unremarkable. IMPRESSION:  Postoperative changes as above with mild atelectasis and asymmetric RIGHT lung edema. Electronically Signed   By: Ulyses Southward M.D.   On: 11/11/2016 16:25   Ct Angio Chest Aorta W &/or Wo Contrast  Result Date: 11/02/2016 CLINICAL DATA:  61 year old male with history of severe mitral regurgitation. Preoperative evaluation prior to potential repair or replacement. EXAM: CT ANGIOGRAPHY CHEST, ABDOMEN AND PELVIS TECHNIQUE: Multidetector CT imaging through the chest, abdomen and pelvis was performed using the standard protocol during bolus administration of intravenous contrast. Multiplanar reconstructed images and MIPs were obtained and reviewed to evaluate the vascular anatomy. CONTRAST:  75 mL of Isovue 370. COMPARISON:  No priors. FINDINGS: CTA CHEST FINDINGS Cardiovascular: Heart size is enlarged with moderate left ventricular and left atrial dilatation. There is no significant pericardial fluid, thickening or pericardial calcification. Mild atherosclerosis of the thoracic aorta without evidence of aneurysm or dissection. No coronary artery calcifications. Ascending, mid arch and descending thoracic aorta measure 2.8 cm, 2.7 cm, and 2.6 cm in diameter respectively. No definite calcifications of the mitral annulus or mitral valve (noncontrast images were not obtained). No calcifications of the aortic valve. Mediastinum/Lymph Nodes: No pathologically enlarged mediastinal or hilar lymph nodes. Esophagus is unremarkable in appearance. No axillary lymphadenopathy. Lungs/Pleura: Small loculated pneumothorax in the apex of the left hemithorax adjacent to several small blebs. This occupies approximately 5% of the volume of the left hemithorax. Mild paraseptal emphysema. No acute consolidative airspace disease. No pleural effusions. Linear scarring in the inferior segment of the lingula. In the apex of the right upper lobe there is a 8 x 6 mm (mean diameter of 7 mm) area of pleuroparenchymal nodular scarring. No other  suspicious appearing pulmonary nodules or masses are noted. There are no aggressive appearing lytic or blastic lesions noted in the visualized portions of the skeleton. Musculoskeletal/Soft Tissues: There are no aggressive appearing lytic or blastic lesions noted in the visualized portions of the skeleton. CTA ABDOMEN AND PELVIS FINDINGS Hepatobiliary: No suspicious cystic or solid hepatic lesions. No intra or extrahepatic biliary ductal dilatation. Gallbladder is normal in appearance. Pancreas: No pancreatic mass. No pancreatic ductal dilatation. No pancreatic or peripancreatic fluid or inflammatory changes. Spleen: Unremarkable. Adrenals/Urinary Tract: Bilateral kidneys and bilateral adrenal glands are normal in appearance. No hydroureteronephrosis or perinephric stranding to indicate urinary tract obstruction at this time. Urinary bladder is normal in appearance. Stomach/Bowel: Normal appearance of the stomach. No pathologic dilatation of small bowel or colon. The appendix is not confidently identified and may be surgically absent. Regardless, there are no inflammatory changes noted adjacent to the cecum to suggest the presence of an acute appendicitis at this time. Vascular/Lymphatic: Aortic atherosclerosis, without evidence of aneurysm, dissection wall or hemodynamically significant stenosis in the abdominal or pelvic vasculature. No lymphadenopathy noted in the abdomen or pelvis. Reproductive: Prostate gland and seminal vesicles are normal in appearance. Other: Trace  volume of ascites.  No pneumoperitoneum. Musculoskeletal: There are no aggressive appearing lytic or blastic lesions noted in the visualized portions of the skeleton. Review of the MIP images confirms the above findings. IMPRESSION: 1. Cardiomegaly with moderate left ventricular and left atrial dilatation, compatible with the reported clinical history of mitral valve disease. No definite calcifications associated with the mitral annulus or the  mitral valve on today's contrast enhanced examination. 2. Small left apical pneumothorax occupying less than 5% of the volume of the left hemithorax. This is likely loculated in the left apex, and given the appearance on the prior chest x-ray may be chronic, potentially related to prior rupture of a small subpleural bleb as the patient does have some very mild paraseptal emphysema. This is of questionable clinical significance, but clinical correlation is recommended. 3. 7 mm subpleural nodule in the apex of the right upper lobe favored to represent a small focus of pleuroparenchymal scarring. Non-contrast chest CT at 6-12 months is recommended. If the nodule is stable at time of repeat CT, then future CT at 18-24 months (from today's scan) is considered optional for low-risk patients, but is recommended for high-risk patients. This recommendation follows the consensus statement: Guidelines for Management of Incidental Pulmonary Nodules Detected on CT Images: From the Fleischner Society 2017; Radiology 2017; 284:228-243. 4. Aortic atherosclerosis (mild), without evidence of aneurysm or dissection. 5. Additional incidental findings, as above. Critical Value/emergent results were called by telephone at the time of interpretation on 11/02/2016 at 12:54 pm to Dr. Tressie Stalker, who verbally acknowledged these results. Electronically Signed   By: Trudie Reed M.D.   On: 11/02/2016 12:58   Ct Angio Abd/pel W/ And/or W/o  Result Date: 11/02/2016 CLINICAL DATA:  61 year old male with history of severe mitral regurgitation. Preoperative evaluation prior to potential repair or replacement. EXAM: CT ANGIOGRAPHY CHEST, ABDOMEN AND PELVIS TECHNIQUE: Multidetector CT imaging through the chest, abdomen and pelvis was performed using the standard protocol during bolus administration of intravenous contrast. Multiplanar reconstructed images and MIPs were obtained and reviewed to evaluate the vascular anatomy. CONTRAST:  75  mL of Isovue 370. COMPARISON:  No priors. FINDINGS: CTA CHEST FINDINGS Cardiovascular: Heart size is enlarged with moderate left ventricular and left atrial dilatation. There is no significant pericardial fluid, thickening or pericardial calcification. Mild atherosclerosis of the thoracic aorta without evidence of aneurysm or dissection. No coronary artery calcifications. Ascending, mid arch and descending thoracic aorta measure 2.8 cm, 2.7 cm, and 2.6 cm in diameter respectively. No definite calcifications of the mitral annulus or mitral valve (noncontrast images were not obtained). No calcifications of the aortic valve. Mediastinum/Lymph Nodes: No pathologically enlarged mediastinal or hilar lymph nodes. Esophagus is unremarkable in appearance. No axillary lymphadenopathy. Lungs/Pleura: Small loculated pneumothorax in the apex of the left hemithorax adjacent to several small blebs. This occupies approximately 5% of the volume of the left hemithorax. Mild paraseptal emphysema. No acute consolidative airspace disease. No pleural effusions. Linear scarring in the inferior segment of the lingula. In the apex of the right upper lobe there is a 8 x 6 mm (mean diameter of 7 mm) area of pleuroparenchymal nodular scarring. No other suspicious appearing pulmonary nodules or masses are noted. There are no aggressive appearing lytic or blastic lesions noted in the visualized portions of the skeleton. Musculoskeletal/Soft Tissues: There are no aggressive appearing lytic or blastic lesions noted in the visualized portions of the skeleton. CTA ABDOMEN AND PELVIS FINDINGS Hepatobiliary: No suspicious cystic or solid hepatic lesions. No intra  or extrahepatic biliary ductal dilatation. Gallbladder is normal in appearance. Pancreas: No pancreatic mass. No pancreatic ductal dilatation. No pancreatic or peripancreatic fluid or inflammatory changes. Spleen: Unremarkable. Adrenals/Urinary Tract: Bilateral kidneys and bilateral adrenal  glands are normal in appearance. No hydroureteronephrosis or perinephric stranding to indicate urinary tract obstruction at this time. Urinary bladder is normal in appearance. Stomach/Bowel: Normal appearance of the stomach. No pathologic dilatation of small bowel or colon. The appendix is not confidently identified and may be surgically absent. Regardless, there are no inflammatory changes noted adjacent to the cecum to suggest the presence of an acute appendicitis at this time. Vascular/Lymphatic: Aortic atherosclerosis, without evidence of aneurysm, dissection wall or hemodynamically significant stenosis in the abdominal or pelvic vasculature. No lymphadenopathy noted in the abdomen or pelvis. Reproductive: Prostate gland and seminal vesicles are normal in appearance. Other: Trace volume of ascites.  No pneumoperitoneum. Musculoskeletal: There are no aggressive appearing lytic or blastic lesions noted in the visualized portions of the skeleton. Review of the MIP images confirms the above findings. IMPRESSION: 1. Cardiomegaly with moderate left ventricular and left atrial dilatation, compatible with the reported clinical history of mitral valve disease. No definite calcifications associated with the mitral annulus or the mitral valve on today's contrast enhanced examination. 2. Small left apical pneumothorax occupying less than 5% of the volume of the left hemithorax. This is likely loculated in the left apex, and given the appearance on the prior chest x-ray may be chronic, potentially related to prior rupture of a small subpleural bleb as the patient does have some very mild paraseptal emphysema. This is of questionable clinical significance, but clinical correlation is recommended. 3. 7 mm subpleural nodule in the apex of the right upper lobe favored to represent a small focus of pleuroparenchymal scarring. Non-contrast chest CT at 6-12 months is recommended. If the nodule is stable at time of repeat CT, then  future CT at 18-24 months (from today's scan) is considered optional for low-risk patients, but is recommended for high-risk patients. This recommendation follows the consensus statement: Guidelines for Management of Incidental Pulmonary Nodules Detected on CT Images: From the Fleischner Society 2017; Radiology 2017; 284:228-243. 4. Aortic atherosclerosis (mild), without evidence of aneurysm or dissection. 5. Additional incidental findings, as above. Critical Value/emergent results were called by telephone at the time of interpretation on 11/02/2016 at 12:54 pm to Dr. Tressie Stalker, who verbally acknowledged these results. Electronically Signed   By: Trudie Reed M.D.   On: 11/02/2016 12:58    PHYSICAL EXAM General: NAD Neck: JVP 12 cm, no thyromegaly or thyroid nodule.  Lungs: Crackles at bases bilaterally.  CV: Nondisplaced PMI.  Heart regular S1/S2, mechanical S2, no S3/S4, 1/6 SEM RUSB.  No peripheral edema.   Abdomen: Soft, nontender, no hepatosplenomegaly, no distention.  Neurologic: Alert and oriented x 3.  Psych: Normal affect. Extremities: No clubbing or cyanosis.   TELEMETRY: Reviewed telemetry pt in v-paced rhythm  ASSESSMENT AND PLAN: 61 yo with history of rheumatic heart disease and atrial fibrillation now s/p mechanical MV replacement, TV repair, Maze + LA appendage clipping.  He is in a junctional rhythm persistently post-op.  1. Rheumatic heart disease: Moderate to severe MR and moderate to severe TR, symptomatic.  Now s/p mechanical MV replacement and TV repair.   - He is on warfarin and ASA 81 with mechanical MV.  2. Acute on chronic systolic CHF: TEE pre-op with EF 40-45% and LV-gram with EF 30-35%.  Post-op echo reviewed, EF stable around 40%.  He remains volume overloaded on exam.  Not sure I/Os appropriately recorded but weight is down.  - Co-ox somewhat marginal at 53% but with stable EF would like to stop milrinone today and will repeat co-ox after that.   - Continue  Lasix 40 mg IV bid.  Needs some further diuresis.  Ordering labs for this morning.   - Continue spironolactone, increase lisinopril to 2.5 mg bid.   3. 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  Patient has a persistent underlying junctional rhythm at around 57 with chronotropic incompetence (HR only rose to 63 walking halls with cardiac rehab yesterday).  Unable to capture atrium using the temporary wires.   - Plan for CRT-P device after chest tube is out, as early as tomorrow.  EP has seen. 4. Atrial fibrillation: Now s/p Maze and LA appendage clipping.  See discussion above of rhythm.  He is off all nodal blockers.  Would be reasonable to add amiodarone prior to discharge.   Marca Ancona 11/17/2016 8:28 AM

## 2016-11-17 NOTE — Progress Notes (Addendum)
[address]       [place] [address]             [phone]      6 Days Post-Op Procedure(s) (LRB): MINIMALLY INVASIVE MITRAL VALVE REPLACEMENT (MVR) WITH 33 MM CARBOMEDICS OPTIFORM MECHANICAL VALVE (N/A) MINIMALLY INVASIVE MAZE PROCEDURE WITH APPLICATION OF ATRICLIP PRO 2 45 ON LAA (N/A) TRANSESOPHAGEAL ECHOCARDIOGRAM (TEE) (N/A) TRICUSPID VALVE REPAIR WITH EDWARDS MC3 TRICUSPID 28 MM ANNULOPLASTY RING MODEL 4900 (N/A) Subjective: Feels well  Objective: Vital signs in last 24 hours: Temp:  [97.6 F (36.4 C)-97.8 F (36.6 C)] 97.8 F (36.6 C) (01/10 0442) Pulse Rate:  [54-70] 70 (01/10 0442) Cardiac Rhythm: A-V Sequential paced;Bundle branch block (01/09 1900) Resp:  [20] 20 (01/10 0442) BP: (117-136)/(48-69) 136/69 (01/10 0442) SpO2:  [87 %-98 %] 95 % (01/10 0442) Weight:  [166 lb 8 oz (75.5 kg)] 166 lb 8 oz (75.5 kg) (01/10 0442)  Hemodynamic parameters for last 24 hours:    Intake/Output from previous day: 01/09 0701 - 01/10 0700 In: 490 [P.O.:480; I.V.:10] Out: 1300 [Urine:1200; Chest Tube:100] Intake/Output this shift: No intake/output data recorded.  General appearance: alert, cooperative and no distress Heart: regular rate and rhythm and crisp valve click, faint rub, no murmur Lungs: coarse right base Abdomen: benign Extremities: no edema Wound: incis healing well  Lab Results:  Recent Labs  11/15/16 0530 11/16/16 0351  WBC 12.2* 12.9*  HGB 9.5* 9.5*  HCT 27.9* 28.7*  PLT 116* 144*   BMET:  Recent Labs  11/15/16 0530 11/16/16 0351  NA 133* 134*  K 3.7 3.6  CL 93* 96*  CO2 33* 32  GLUCOSE 94 94  BUN 16 17  CREATININE 0.73 0.79  CALCIUM 8.1* 8.3*    PT/INR:  Recent Labs  11/17/16 0433  LABPROT 24.4*  INR 2.16   ABG    Component Value Date/Time   PHART 7.332 (L) 11/12/2016 0506   HCO3 21.4 11/12/2016 0506   TCO2 25 11/12/2016 1602   ACIDBASEDEF 5.0 (H) 11/12/2016 0506   O2SAT 53.0 11/17/2016 0500   CBG  (last 3)   Recent Labs  11/14/16 2015 11/15/16 0735 11/15/16 1128  GLUCAP 128* 95 150*    Meds Scheduled Meds: . aspirin EC  81 mg Oral Daily  . bisacodyl  10 mg Oral Daily   Or  . bisacodyl  10 mg Rectal Daily  . diclofenac sodium  2 g Topical QID  . docusate sodium  200 mg Oral Daily  . folic acid-pyridoxine-cyancobalamin  1 tablet Oral Daily  . furosemide  40 mg Intravenous BID  . gabapentin  300 mg Oral BID  . guaiFENesin  600 mg Oral BID  . iron polysaccharides  150 mg Oral Daily  . lisinopril  2.5 mg Oral Daily  . mouth rinse  15 mL Mouth Rinse BID  . pantoprazole  40 mg Oral Daily  . potassium chloride  20 mEq Oral BID  . sodium chloride flush  10-40 mL Intracatheter Q12H  . sodium chloride flush  3 mL Intravenous Q12H  . sodium chloride flush  3 mL Intravenous Q12H  . spironolactone  12.5 mg Oral Daily  . warfarin  5 mg Oral q1800  . Warfarin - Physician Dosing Inpatient   Does not apply q1800   Continuous Infusions: . sodium chloride Stopped (11/12/16 1400)  . milrinone 0.1 mcg/kg/min (11/16/16 1815)   PRN Meds:.sodium chloride, chlorpheniramine-HYDROcodone, metoprolol, ondansetron (ZOFRAN) IV, oxyCODONE, sodium chloride flush,  sodium chloride flush, sodium chloride flush, sorbitol, traMADol  Xrays No results found.  Assessment/Plan: S/P Procedure(s) (LRB): MINIMALLY INVASIVE MITRAL VALVE REPLACEMENT (MVR) WITH 33 MM CARBOMEDICS OPTIFORM MECHANICAL VALVE (N/A) MINIMALLY INVASIVE MAZE PROCEDURE WITH APPLICATION OF ATRICLIP PRO 2 45 ON LAA (N/A) TRANSESOPHAGEAL ECHOCARDIOGRAM (TEE) (N/A) TRICUSPID VALVE REPAIR WITH EDWARDS MC3 TRICUSPID 28 MM ANNULOPLASTY RING MODEL 4900 (N/A)  1 doing well 2 HR in 50's under pacer, EP is following, may need CRTD 3  CT 125 out yesterday, 100 out so far today- probably d/c tubes today 4 INR 2.1- may need to decrease coumadin dose in light of need for possible pacer placement 5 Co-Ox 53 on 0.1 milrinone- AHF team is now  assisting with management- defer changes to them . BP is controlled ECHO- [id number] 6 no other new labs today   LOS: 6 days    GOLD,WAYNE E 11/17/2016  I have seen and examined the patient and agree with the assessment and plan as outlined. Stop milrinone.  D/C chest tubes.  Will place pacer to VVI backup and observe.  Currently junctional w/ HR 55-60.  Some chronotropic incompetence noted with ambulation yesterday.  Post op ECHO looks good w/ EF 40%.  Will continue Coumadin 5 mg/day for now w/ goal INR 2.5-3.5 given mechanical valve in mitral position.  Purcell Nails, MD 11/17/2016 9:37 AM

## 2016-11-18 ENCOUNTER — Encounter (HOSPITAL_COMMUNITY): Payer: Self-pay | Admitting: Internal Medicine

## 2016-11-18 ENCOUNTER — Encounter (HOSPITAL_COMMUNITY)
Admission: RE | Disposition: A | Discharge status: Home / Self Care | Payer: Self-pay | Source: Ambulatory Visit | Attending: Thoracic Surgery (Cardiothoracic Vascular Surgery)

## 2016-11-18 DIAGNOSIS — I442 Atrioventricular block, complete: Secondary | ICD-10-CM

## 2016-11-18 DIAGNOSIS — I5022 Chronic systolic (congestive) heart failure: Secondary | ICD-10-CM

## 2016-11-18 HISTORY — PX: EP IMPLANTABLE DEVICE: SHX172B

## 2016-11-18 LAB — BASIC METABOLIC PANEL
Anion gap: 7 (ref 5–15)
BUN: 19 mg/dL (ref 6–20)
CHLORIDE: 96 mmol/L — AB (ref 101–111)
CO2: 31 mmol/L (ref 22–32)
Calcium: 8.6 mg/dL — ABNORMAL LOW (ref 8.9–10.3)
Creatinine, Ser: 0.67 mg/dL (ref 0.61–1.24)
GFR calc Af Amer: >60 mL/min (ref >60)
GFR calc non Af Amer: >60 mL/min (ref >60)
GLUCOSE: 95 mg/dL (ref 65–99)
POTASSIUM: 4.3 mmol/L (ref 3.5–5.1)
Sodium: 134 mmol/L — ABNORMAL LOW (ref 135–145)

## 2016-11-18 LAB — CBC
HEMATOCRIT: 30.7 % — AB (ref 39.0–52.0)
Hemoglobin: 10 g/dL — ABNORMAL LOW (ref 13.0–17.0)
MCH: 29.6 pg (ref 26.0–34.0)
MCHC: 32.6 g/dL (ref 30.0–36.0)
MCV: 90.8 fL (ref 78.0–100.0)
Platelets: 260 10*3/uL (ref 150–400)
RBC: 3.38 MIL/uL — ABNORMAL LOW (ref 4.22–5.81)
RDW: 15.6 % — AB (ref 11.5–15.5)
WBC: 11.8 10*3/uL — ABNORMAL HIGH (ref 4.0–10.5)

## 2016-11-18 LAB — PROTIME-INR
INR: 2.25
Prothrombin Time: 25.3 seconds — ABNORMAL HIGH (ref 11.4–15.2)

## 2016-11-18 LAB — COOXEMETRY PANEL
Carboxyhemoglobin: 1.4 % (ref 0.5–1.5)
Methemoglobin: 1.1 % (ref 0.0–1.5)
O2 Saturation: 52.8 %
TOTAL HEMOGLOBIN: 10.3 g/dL — AB (ref 12.0–16.0)

## 2016-11-18 SURGERY — BIV PACEMAKER INSERTION CRT-P

## 2016-11-18 MED ORDER — CHLORHEXIDINE GLUCONATE 4 % EX LIQD
CUTANEOUS | Status: AC
  Filled 2016-11-18: qty 15

## 2016-11-18 MED ORDER — ACETAMINOPHEN 325 MG PO TABS
325.0000 mg | ORAL_TABLET | ORAL | Status: DC | PRN
Start: 2016-11-18 — End: 2016-11-20
  Administered 2016-11-18: 650 mg via ORAL
  Filled 2016-11-18: qty 2

## 2016-11-18 MED ORDER — IOPAMIDOL (ISOVUE-370) INJECTION 76%
INTRAVENOUS | Status: DC | PRN
  Administered 2016-11-18: 20 mL via INTRAVENOUS

## 2016-11-18 MED ORDER — CEFAZOLIN SODIUM-DEXTROSE 2-4 GM/100ML-% IV SOLN
INTRAVENOUS | Status: AC
  Filled 2016-11-18: qty 100

## 2016-11-18 MED ORDER — IOPAMIDOL (ISOVUE-370) INJECTION 76%
INTRAVENOUS | Status: AC
  Filled 2016-11-18: qty 50

## 2016-11-18 MED ORDER — LIDOCAINE HCL (PF) 1 % IJ SOLN
INTRAMUSCULAR | Status: DC | PRN
  Administered 2016-11-18: 60 mL

## 2016-11-18 MED ORDER — CEFAZOLIN IN D5W 1 GM/50ML IV SOLN
1.0000 g | Freq: Four times a day (QID) | INTRAVENOUS | Status: AC
  Administered 2016-11-18 – 2016-11-19 (×3): 1 g via INTRAVENOUS
  Filled 2016-11-18 (×3): qty 50

## 2016-11-18 MED ORDER — HEPARIN (PORCINE) IN NACL 2-0.9 UNIT/ML-% IJ SOLN
INTRAMUSCULAR | Status: DC | PRN
  Administered 2016-11-18: 500 mL

## 2016-11-18 MED ORDER — FENTANYL CITRATE (PF) 100 MCG/2ML IJ SOLN
INTRAMUSCULAR | Status: AC
  Filled 2016-11-18: qty 2

## 2016-11-18 MED ORDER — MIDAZOLAM HCL 5 MG/5ML IJ SOLN
INTRAMUSCULAR | Status: AC
  Filled 2016-11-18: qty 5

## 2016-11-18 MED ORDER — HEPARIN (PORCINE) IN NACL 2-0.9 UNIT/ML-% IJ SOLN
INTRAMUSCULAR | Status: AC
  Filled 2016-11-18: qty 500

## 2016-11-18 MED ORDER — LIDOCAINE HCL (PF) 1 % IJ SOLN
INTRAMUSCULAR | Status: AC
  Filled 2016-11-18: qty 60

## 2016-11-18 MED ORDER — SODIUM CHLORIDE 0.9 % IR SOLN
Status: DC | PRN
  Administered 2016-11-18: 12:00:00

## 2016-11-18 MED ORDER — FENTANYL CITRATE (PF) 100 MCG/2ML IJ SOLN
INTRAMUSCULAR | Status: DC | PRN
  Administered 2016-11-18 (×2): 12.5 ug via INTRAVENOUS
  Administered 2016-11-18: 25 ug via INTRAVENOUS
  Administered 2016-11-18 (×2): 12.5 ug via INTRAVENOUS

## 2016-11-18 MED ORDER — MIDAZOLAM HCL 5 MG/5ML IJ SOLN
INTRAMUSCULAR | Status: DC | PRN
  Administered 2016-11-18 (×6): 1 mg via INTRAVENOUS

## 2016-11-18 MED ORDER — SODIUM CHLORIDE 0.9 % IR SOLN
Status: AC
  Filled 2016-11-18: qty 2

## 2016-11-18 MED ORDER — SODIUM CHLORIDE 0.9 % IV SOLN
INTRAVENOUS | Status: DC | PRN
  Administered 2016-11-18: 20 mL/h via INTRAVENOUS

## 2016-11-18 MED ORDER — AMIODARONE HCL 200 MG PO TABS
200.0000 mg | ORAL_TABLET | Freq: Two times a day (BID) | ORAL | Status: DC
  Administered 2016-11-18 – 2016-11-20 (×5): 200 mg via ORAL
  Filled 2016-11-18 (×5): qty 1

## 2016-11-18 MED ORDER — ONDANSETRON HCL 4 MG/2ML IJ SOLN: 4.0000 mg | Freq: Four times a day (QID) | INTRAMUSCULAR | Status: DC | PRN

## 2016-11-18 SURGICAL SUPPLY — 21 items
ADAPTER SEALING SSA-EW-09 (MISCELLANEOUS) ×4 IMPLANT
CABLE SURGICAL S-101-97-12 (CABLE) ×2 IMPLANT
CATH ATTAIN COM SURV 6250V-MB2 (CATHETERS) ×2 IMPLANT
CATH ATTAIN SEL SURV 6248V-90 (CATHETERS) ×2 IMPLANT
CATH ATTAIN SELECT 6238TEL (CATHETERS) ×2 IMPLANT
CATH HEX JOSEPH 2-5-2 65CM 6F (CATHETERS) ×2 IMPLANT
LEAD ATTAIN ABILITY 4396-88CM (Lead) ×2 IMPLANT
LEAD CAPSURE NOVUS 5076-52CM (Lead) ×2 IMPLANT
LEAD CAPSURE NOVUS 5076-58CM (Lead) ×2 IMPLANT
PACEMAKER PRCT MRI CRTP W1TR01 (Pacemaker) ×1 IMPLANT
PAD DEFIB LIFELINK (PAD) ×2 IMPLANT
PPM PRECEPTA MRI CRT-P W1TR01 (Pacemaker) ×2 IMPLANT
SHEATH CLASSIC 7F (SHEATH) ×4 IMPLANT
SHEATH CLASSIC 9.5F (SHEATH) ×2 IMPLANT
SHIELD RADPAD SCOOP 12X17 (MISCELLANEOUS) ×2 IMPLANT
SLITTER 6232ADJ (MISCELLANEOUS) ×2 IMPLANT
TRAY PACEMAKER INSERTION (PACKS) ×2 IMPLANT
WIRE ACUITY WHISPER EDS 4648 (WIRE) ×2 IMPLANT
WIRE ASAHI SOFT 180CM (WIRE) ×2 IMPLANT
WIRE LUGE 182CM (WIRE) ×2 IMPLANT
WIRE MAILMAN 182CM (WIRE) ×2 IMPLANT

## 2016-11-18 NOTE — Progress Notes (Addendum)
[address]       [place] [address]             [phone]      7 Days Post-Op Procedure(s) (LRB): MINIMALLY INVASIVE MITRAL VALVE REPLACEMENT (MVR) WITH 33 MM CARBOMEDICS OPTIFORM MECHANICAL VALVE (N/A) MINIMALLY INVASIVE MAZE PROCEDURE WITH APPLICATION OF ATRICLIP PRO 2 45 ON LAA (N/A) TRANSESOPHAGEAL ECHOCARDIOGRAM (TEE) (N/A) TRICUSPID VALVE REPAIR WITH EDWARDS MC3 TRICUSPID 28 MM ANNULOPLASTY RING MODEL 4900 (N/A) Subjective: conts to feel well  Objective: Vital signs in last 24 hours: Temp:  [97.6 F (36.4 C)-97.8 F (36.6 C)] 97.6 F (36.4 C) (01/11 0446) Pulse Rate:  [50-61] 59 (01/11 0446) Cardiac Rhythm: Ventricular paced (01/10 1900) Resp:  [18] 18 (01/11 0446) BP: (116-131)/(42-61) 122/49 (01/11 0446) SpO2:  [97 %-99 %] 97 % (01/11 0446) Weight:  [166 lb 3.2 oz (75.4 kg)] 166 lb 3.2 oz (75.4 kg) (01/11 0446)  Hemodynamic parameters for last 24 hours:    Intake/Output from previous day: 01/10 0701 - 01/11 0700 In: 276.2 [P.O.:240; I.V.:36.2] Out: 2000 [Urine:2000] Intake/Output this shift: No intake/output data recorded.  General appearance: alert, cooperative and no distress Heart: regular rate and rhythm and + click without murmur Lungs: basilar crackles Abdomen: benign Extremities: no edema Wound: incis healing well  Lab Results:  Recent Labs  11/17/16 1138 11/18/16 0418  WBC 13.1* 11.8*  HGB 10.2* 10.0*  HCT 30.8* 30.7*  PLT 229 260   BMET:  Recent Labs  11/17/16 1138 11/18/16 0418  NA 135 134*  K 3.9 4.3  CL 95* 96*  CO2 31 31  GLUCOSE 103* 95  BUN 19 19  CREATININE 0.68 0.67  CALCIUM 8.5* 8.6*    PT/INR:  Recent Labs  11/18/16 0418  LABPROT 25.3*  INR 2.25   ABG    Component Value Date/Time   PHART 7.332 (L) 11/12/2016 0506   HCO3 21.4 11/12/2016 0506   TCO2 25 11/12/2016 1602   ACIDBASEDEF 5.0 (H) 11/12/2016 0506   O2SAT 52.8 11/18/2016 0429   CBG (last 3)   Recent Labs   11/15/16 1128  GLUCAP 150*    Meds Scheduled Meds: . aspirin EC  81 mg Oral Daily  . bisacodyl  10 mg Oral Daily   Or  . bisacodyl  10 mg Rectal Daily  .  ceFAZolin (ANCEF) IV  2 g Intravenous To SSTC  . diclofenac sodium  2 g Topical QID  . docusate sodium  200 mg Oral Daily  . folic acid-pyridoxine-cyancobalamin  1 tablet Oral Daily  . furosemide  40 mg Intravenous BID  . gentamicin irrigation  80 mg Irrigation To SSTC  . iron polysaccharides  150 mg Oral Daily  . lisinopril  2.5 mg Oral BID  . mouth rinse  15 mL Mouth Rinse BID  . pantoprazole  40 mg Oral Daily  . potassium chloride  20 mEq Oral BID  . sodium chloride flush  10-40 mL Intracatheter Q12H  . sodium chloride flush  3 mL Intravenous Q12H  . sodium chloride flush  3 mL Intravenous Q12H  . spironolactone  12.5 mg Oral Daily  . warfarin  5 mg Oral q1800  . Warfarin - Physician Dosing Inpatient   Does not apply q1800   Continuous Infusions: . sodium chloride Stopped (11/12/16 1400)  . sodium chloride     PRN Meds:.sodium chloride, chlorpheniramine-HYDROcodone, metoprolol, ondansetron (ZOFRAN) IV, oxyCODONE, sodium chloride flush, sodium chloride flush, sodium chloride flush, sorbitol, traMADol  Xrays Dg Chest 2 View  Result Date: 11/17/2016 CLINICAL DATA:  Right-sided chest tube removal, followup EXAM: CHEST  2 VIEW COMPARISON:  Chest x-ray of 11/15/2016 FINDINGS: After right chest tube removal no definite right pneumothorax is seen. There is some linear atelectasis at the right lung base. Airspace disease has improved within the left mid and upper lung and at the right lung base in the interval. Cardiomegaly is stable and mitral valve replacement is noted with aortic valve replacement and left atrial appendage exclusion device noted as well. There appears to be a small left pleural effusion. Right PICC line tip overlies the lower SVC. IMPRESSION: 1. After removal of the right chest tube no definite right pneumothorax  is seen. 2. Some improvement in airspace disease in the left mid and upper lung and the right lung base. 3. Stable cardiomegaly with probable small left pleural effusion. Electronically Signed   By: Dwyane Dee M.D.   On: 11/17/2016 16:18    Assessment/Plan: S/P Procedure(s) (LRB): MINIMALLY INVASIVE MITRAL VALVE REPLACEMENT (MVR) WITH 33 MM CARBOMEDICS OPTIFORM MECHANICAL VALVE (N/A) MINIMALLY INVASIVE MAZE PROCEDURE WITH APPLICATION OF ATRICLIP PRO 2 45 ON LAA (N/A) TRANSESOPHAGEAL ECHOCARDIOGRAM (TEE) (N/A) TRICUSPID VALVE REPAIR WITH EDWARDS MC3 TRICUSPID 28 MM ANNULOPLASTY RING MODEL 4900 (N/A)  1 doing well 2 some bradycardia with noncapture- probable CRTD today 3 labs stable 4 AHF team managing heart failure isssues , recommending amio prior to d/c. CO-OX is stable 5 cont coumaadin 6 home soon  LOS: 7 days    [name],[name] E 11/18/2016  I have seen and examined the patient and agree with the assessment and plan as outlined.  Purcell Nails, MD 11/18/2016

## 2016-11-18 NOTE — Discharge Summary (Signed)
Physician Discharge Summary       [address] [place]       [name] [address]             [phone]    Patient ID: [name] [name] MRN: [id number] DOB/AGE: 02-03-1956 61 y.o.  Admit date: 11/11/2016 Discharge date: 11/20/2016  Admission Diagnoses: 1. Severe mitral regurgitation 2. Recurrent persistent atrial fibrillation 3. Dilated non ischemic cardiomyopathy  Active Diagnoses:  1. Moderate to severe tricuspid regurgitation 2. Tobacco abuse  3. Non-ischemic cardiomyopathy (HCC) 4. LAE (left atrial enlargement) 5. Acute on chronic systolic CHF (congestive heart failure) (HCC) 6. Junctional rhythm 7. ABL anemia   Consults: cardiology and EPS  Procedure (s):   Minimally-Invasive Mitral Valve Replacement             Sorin [employer]omedics Optiform bileaflet mechanical valve (size 33mm, catalog #[id number], serial #[id number])   Minimally-Invasive Tricuspid Valve Repair             Edwards mc3 ring annuloplasty (size 28 mm, model #4900, serial #[id number])   Minimally-Invasive Maze Procedure             Complete bilateral atrial lesion set using cryothermy and bipolar radiofrequency ablation             Clipping of Left Atrial Appendage (Atricure Pro 245 left atrial clip, size 45 mm) by 11/11/2016.  History of Presenting Illness: Patient is a 61 year old male who was recently hospitalized with congestive heart failure, found to be in rapid atrial fibrillation, and subsequently diagnosed with severe mitral regurgitation who has been referred for surgical consultation.  The patient states that he was told that he has had a heart murmur for nearly all of his life but he has never previously been referred to a cardiologist or added an echocardiogram performed. He denies any known history of rheumatic fever or scarlet fever. He states that he was in his usual state of health until approximately 2 months ago when he developed a viral illness manifest as low-grade fevers, chills,  malaise, and a productive cough. He was treated with expectorant's and a short course of antibiotics and symptoms initially improved. Within a couple of weeks he began to experience progressive shortness of breath. Symptoms progressed fairly rapidly, ultimately prompting him to present to his primary care physician 10/02/2016 with symptoms of resting shortness breath and orthopnea. He was found to be in rapid atrial fibrillation and admitted directly to the hospital. Troponins were negative but BNP level was elevated. Transthoracic echocardiogram performed 10/03/2016 revealed moderate left ventricular systolic dysfunction with ejection fraction estimated 40-45%. There was moderate to severe mitral regurgitation, severe left atrial enlargement, mild right ventricular systolic dysfunction, severe right atrial enlargement, and mild tricuspid regurgitation.  Chest x-ray revealed mild pulmonary edema.  He was anticoagulated and underwent attempted TEE guided cardioversion 10/04/2016. The patient did not maintain sinus rhythm but TEE confirmed the presence of severe mitral regurgitation with moderate thickening of the mitral valve leaflets with somewhat restricted leaflet mobility, suggestive of underlying rheumatic disease.  Heart failure symptoms improved with diuresis and rate control. He was anticoagulated using Eliquis and discharged home. He underwent left and right heart catheterization on 10/20/2016. He was found to have mild nonobstructive coronary artery disease severe global left ventricular systolic dysfunction with ejection fraction estimated 30-35%, and severe mitral regurgitation.  There was only mild pulmonary hypertension with PA pressures measured 35/12 and mean central venous pressure 4 mmHg.  He was referred for surgical consultation.  Patient  is married and lives locally in [place] with his wife. He has 4 adult children and 8 grandchildren. 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  He reports no physical limitations prior to that which has developed rather acutely in association with his recent illness. He states that since hospital discharge she has been doing reasonably well although he still gets short of breath with activity. He denies any resting shortness of breath or PND. He had PND and orthopnea immediately prior to his recent hospitalization. He has not had any palpitations, chest pain, dizzy spells, or syncope.  The patient and his wife were counseled at length regarding the indications, risks and potential benefits of mitral valve repair or replacement and maze procedure.  The rationale for elective surgery has been explained, including a comparison between surgery and continued medical therapy with close follow-up.  The likelihood of successful and durable valve repair has been discussed with particular reference to the findings of their recent echocardiogram.  Based upon these findings and previous experience, I have quoted them a less than 50 percent likelihood of successful valve repair.  In the event that his valve cannot be successfully repaired, we discussed the possibility of replacing the mitral valve using a mechanical prosthesis with the attendant need for long-term anticoagulation versus the alternative of replacing it using a bioprosthetic tissue valve with its potential for late structural valve deterioration and failure, depending upon the patient's longevity.  The patient specifically requests that if the mitral valve must be replaced that it be done using a mechanical valve.   The patient understands and accepts all potential risks of surgery. He was admitted on 11/11/2016 in order to undergo a mitral valve replacement, tricuspid valve repair, and MAZE.  Brief Hospital Course:  The patient was extubated early the morning of post operative day one without difficulty. He remained afebrile  and hemodynamically stable. He was weaned off of Epinephrine and Neo Synephrine drips. Theone Murdoch, a line and foley were removed early in the post operative course.  Chest tubes remained for a several days and then were removed. He remained paced and was underlying junctional rhythm. He was continued on Milrinone drip and co ox were checked. He was started on Coumadin for a mechanical valve. Daily PT and INR were obtained. INR should be 2.5-3.5 with a goal of 3. His last INR was 2.49 and he is on Coumadin 5 mg. He was volume over loaded and diuresed. He had ABL anemia. He did not require a post op transfusion. Last H and H was stable at 10 and 30.7. He was weaned off the insulin drip.  The patient's HGA1C pre op was 5.6.  EPS and cardiology were consulted secondary to rhythm issue (bradycardia, junctional). They continued to monitor his rhythm to determine if he would need CRTD.  Echo done The patient was felt surgically stable for transfer from the ICU to [facility] for further convalescence on 11/15/2016. He continues to progress with cardiac rehab. He was ambulating on room air. He has been tolerating a diet and has had a bowel movement.  Epicardial wires and chest tube sutures will be removed prior to discharge. The patient is felt surgically stable for discharge today.  Latest Vital Signs: Blood pressure (!) 118/51, pulse 70, temperature 97.7 F (36.5 C), temperature source Oral, resp. rate 18, height 5\' 10"  (1.778 m), weight 158 lb 1.6 oz (71.7 kg), SpO2 96 %.  Physical Exam: General appearance: alert, cooperative and no distress Heart: regular rate and rhythm and + click without murmur Lungs: basilar crackles Abdomen: benign Extremities: no edema Wound: incis healing well  Discharge Condition:Stable and discharged to home.  Recent laboratory studies:  Lab Results  Component Value Date   WBC 11.8 (H) 11/18/2016   HGB 10.0 (L) 11/18/2016   HCT 30.7 (L) 11/18/2016   MCV 90.8 11/18/2016   PLT 260  11/18/2016   Lab Results  Component Value Date   NA 134 (L) 11/20/2016   K 4.5 11/20/2016   CL 101 11/20/2016   CO2 27 11/20/2016   CREATININE 0.76 11/20/2016   GLUCOSE 105 (H) 11/20/2016    Diagnostic Studies: Dg Chest 2 View  Result Date: 11/17/2016 CLINICAL DATA:  Right-sided chest tube removal, followup EXAM: CHEST  2 VIEW COMPARISON:  Chest x-ray of 11/15/2016 FINDINGS: After right chest tube removal no definite right pneumothorax is seen. There is some linear atelectasis at the right lung base. Airspace disease has improved within the left mid and upper lung and at the right lung base in the interval. Cardiomegaly is stable and mitral valve replacement is noted with aortic valve replacement and left atrial appendage exclusion device noted as well. There appears to be a small left pleural effusion. Right PICC line tip overlies the lower SVC. IMPRESSION: 1. After removal of the right chest tube no definite right pneumothorax is seen. 2. Some improvement in airspace disease in the left mid and upper lung and the right lung base. 3. Stable cardiomegaly with probable small left pleural effusion. Electronically Signed   By: Dwyane Dee M.D.   On: 11/17/2016 16:18    Ct Angio Chest Aorta W &/or Wo Contrast  Result Date: 11/02/2016 CLINICAL DATA:  61 year old male with history of severe mitral regurgitation. Preoperative evaluation prior to potential repair or replacement. EXAM: CT ANGIOGRAPHY CHEST, ABDOMEN AND PELVIS TECHNIQUE: Multidetector CT imaging through the chest, abdomen and pelvis was performed using the standard protocol during bolus administration of intravenous contrast. Multiplanar reconstructed images and MIPs were obtained and reviewed to evaluate the vascular anatomy. CONTRAST:  75 mL of Isovue 370. COMPARISON:  No priors. FINDINGS: CTA CHEST FINDINGS Cardiovascular: Heart size is enlarged with moderate left ventricular and left atrial dilatation. There is no significant  pericardial fluid, thickening or pericardial calcification. Mild atherosclerosis of the thoracic aorta without evidence of aneurysm or dissection. No coronary artery calcifications. Ascending, mid arch and descending thoracic aorta measure 2.8 cm, 2.7 cm, and 2.6 cm in diameter respectively. No definite calcifications of the mitral annulus or mitral valve (noncontrast images were not obtained). No calcifications of the aortic valve. Mediastinum/Lymph Nodes: No pathologically enlarged mediastinal or hilar lymph nodes. Esophagus is unremarkable in appearance. No axillary lymphadenopathy. Lungs/Pleura: Small loculated pneumothorax in the apex of the left hemithorax adjacent to several small blebs. This occupies approximately 5% of the volume of the left hemithorax. Mild paraseptal emphysema. No acute consolidative airspace disease. No pleural effusions. Linear scarring in the inferior segment of the lingula. In the apex of the right upper lobe there is a 8 x 6 mm (mean diameter of 7 mm) area of pleuroparenchymal nodular scarring. No other suspicious appearing pulmonary nodules or masses are noted. There are no aggressive appearing lytic or blastic lesions noted in the visualized portions of the skeleton. Musculoskeletal/Soft Tissues: There are no aggressive appearing lytic or blastic lesions noted in the visualized portions of the skeleton. CTA ABDOMEN AND PELVIS FINDINGS Hepatobiliary:  No suspicious cystic or solid hepatic lesions. No intra or extrahepatic biliary ductal dilatation. Gallbladder is normal in appearance. Pancreas: No pancreatic mass. No pancreatic ductal dilatation. No pancreatic or peripancreatic fluid or inflammatory changes. Spleen: Unremarkable. Adrenals/Urinary Tract: Bilateral kidneys and bilateral adrenal glands are normal in appearance. No hydroureteronephrosis or perinephric stranding to indicate urinary tract obstruction at this time. Urinary bladder is normal in appearance. Stomach/Bowel:  Normal appearance of the stomach. No pathologic dilatation of small bowel or colon. The appendix is not confidently identified and may be surgically absent. Regardless, there are no inflammatory changes noted adjacent to the cecum to suggest the presence of an acute appendicitis at this time. Vascular/Lymphatic: Aortic atherosclerosis, without evidence of aneurysm, dissection wall or hemodynamically significant stenosis in the abdominal or pelvic vasculature. No lymphadenopathy noted in the abdomen or pelvis. Reproductive: Prostate gland and seminal vesicles are normal in appearance. Other: Trace volume of ascites.  No pneumoperitoneum. Musculoskeletal: There are no aggressive appearing lytic or blastic lesions noted in the visualized portions of the skeleton. Review of the MIP images confirms the above findings. IMPRESSION: 1. Cardiomegaly with moderate left ventricular and left atrial dilatation, compatible with the reported clinical history of mitral valve disease. No definite calcifications associated with the mitral annulus or the mitral valve on today's contrast enhanced examination. 2. Small left apical pneumothorax occupying less than 5% of the volume of the left hemithorax. This is likely loculated in the left apex, and given the appearance on the prior chest x-ray may be chronic, potentially related to prior rupture of a small subpleural bleb as the patient does have some very mild paraseptal emphysema. This is of questionable clinical significance, but clinical correlation is recommended. 3. 7 mm subpleural nodule in the apex of the right upper lobe favored to represent a small focus of pleuroparenchymal scarring. Non-contrast chest CT at 6-12 months is recommended. If the nodule is stable at time of repeat CT, then future CT at 18-24 months (from today's scan) is considered optional for low-risk patients, but is recommended for high-risk patients. This recommendation follows the consensus statement:  Guidelines for Management of Incidental Pulmonary Nodules Detected on CT Images: From the Fleischner Society 2017; Radiology 2017; 284:228-243. 4. Aortic atherosclerosis (mild), without evidence of aneurysm or dissection. 5. Additional incidental findings, as above. Critical Value/emergent results were called by telephone at the time of interpretation on 11/02/2016 at 12:54 pm to Dr. Tressie Stalker, who verbally acknowledged these results. Electronically Signed   By: Trudie Reed M.D.   On: 11/02/2016 12:58   Ct Angio Abd/pel W/ And/or W/o  Result Date: 11/02/2016 CLINICAL DATA:  61 year old male with history of severe mitral regurgitation. Preoperative evaluation prior to potential repair or replacement. EXAM: CT ANGIOGRAPHY CHEST, ABDOMEN AND PELVIS TECHNIQUE: Multidetector CT imaging through the chest, abdomen and pelvis was performed using the standard protocol during bolus administration of intravenous contrast. Multiplanar reconstructed images and MIPs were obtained and reviewed to evaluate the vascular anatomy. CONTRAST:  75 mL of Isovue 370. COMPARISON:  No priors. FINDINGS: CTA CHEST FINDINGS Cardiovascular: Heart size is enlarged with moderate left ventricular and left atrial dilatation. There is no significant pericardial fluid, thickening or pericardial calcification. Mild atherosclerosis of the thoracic aorta without evidence of aneurysm or dissection. No coronary artery calcifications. Ascending, mid arch and descending thoracic aorta measure 2.8 cm, 2.7 cm, and 2.6 cm in diameter respectively. No definite calcifications of the mitral annulus or mitral valve (noncontrast images were not obtained). No calcifications of  the aortic valve. Mediastinum/Lymph Nodes: No pathologically enlarged mediastinal or hilar lymph nodes. Esophagus is unremarkable in appearance. No axillary lymphadenopathy. Lungs/Pleura: Small loculated pneumothorax in the apex of the left hemithorax adjacent to several small  blebs. This occupies approximately 5% of the volume of the left hemithorax. Mild paraseptal emphysema. No acute consolidative airspace disease. No pleural effusions. Linear scarring in the inferior segment of the lingula. In the apex of the right upper lobe there is a 8 x 6 mm (mean diameter of 7 mm) area of pleuroparenchymal nodular scarring. No other suspicious appearing pulmonary nodules or masses are noted. There are no aggressive appearing lytic or blastic lesions noted in the visualized portions of the skeleton. Musculoskeletal/Soft Tissues: There are no aggressive appearing lytic or blastic lesions noted in the visualized portions of the skeleton. CTA ABDOMEN AND PELVIS FINDINGS Hepatobiliary: No suspicious cystic or solid hepatic lesions. No intra or extrahepatic biliary ductal dilatation. Gallbladder is normal in appearance. Pancreas: No pancreatic mass. No pancreatic ductal dilatation. No pancreatic or peripancreatic fluid or inflammatory changes. Spleen: Unremarkable. Adrenals/Urinary Tract: Bilateral kidneys and bilateral adrenal glands are normal in appearance. No hydroureteronephrosis or perinephric stranding to indicate urinary tract obstruction at this time. Urinary bladder is normal in appearance. Stomach/Bowel: Normal appearance of the stomach. No pathologic dilatation of small bowel or colon. The appendix is not confidently identified and may be surgically absent. Regardless, there are no inflammatory changes noted adjacent to the cecum to suggest the presence of an acute appendicitis at this time. Vascular/Lymphatic: Aortic atherosclerosis, without evidence of aneurysm, dissection wall or hemodynamically significant stenosis in the abdominal or pelvic vasculature. No lymphadenopathy noted in the abdomen or pelvis. Reproductive: Prostate gland and seminal vesicles are normal in appearance. Other: Trace volume of ascites.  No pneumoperitoneum. Musculoskeletal: There are no aggressive appearing  lytic or blastic lesions noted in the visualized portions of the skeleton. Review of the MIP images confirms the above findings. IMPRESSION: 1. Cardiomegaly with moderate left ventricular and left atrial dilatation, compatible with the reported clinical history of mitral valve disease. No definite calcifications associated with the mitral annulus or the mitral valve on today's contrast enhanced examination. 2. Small left apical pneumothorax occupying less than 5% of the volume of the left hemithorax. This is likely loculated in the left apex, and given the appearance on the prior chest x-ray may be chronic, potentially related to prior rupture of a small subpleural bleb as the patient does have some very mild paraseptal emphysema. This is of questionable clinical significance, but clinical correlation is recommended. 3. 7 mm subpleural nodule in the apex of the right upper lobe favored to represent a small focus of pleuroparenchymal scarring. Non-contrast chest CT at 6-12 months is recommended. If the nodule is stable at time of repeat CT, then future CT at 18-24 months (from today's scan) is considered optional for low-risk patients, but is recommended for high-risk patients. This recommendation follows the consensus statement: Guidelines for Management of Incidental Pulmonary Nodules Detected on CT Images: From the Fleischner Society 2017; Radiology 2017; 284:228-243. 4. Aortic atherosclerosis (mild), without evidence of aneurysm or dissection. 5. Additional incidental findings, as above. Critical Value/emergent results were called by telephone at the time of interpretation on 11/02/2016 at 12:54 pm to Dr. Tressie Stalker, who verbally acknowledged these results. Electronically Signed   By: Trudie Reed M.D.   On: 11/02/2016 12:58   Discharge Instructions    Amb Referral to Cardiac Rehabilitation    Complete by:  As  directed    Diagnosis:   Valve Repair Valve Replacement     Valve:   Tricuspid Mitral        Discharge Medications: Allergies as of 11/20/2016      Reactions   No Known Allergies       Medication List    STOP taking these medications   apixaban 5 MG Tabs tablet Commonly known as:  ELIQUIS   metoprolol 50 MG tablet Commonly known as:  LOPRESSOR     TAKE these medications   acetaminophen 500 MG tablet Commonly known as:  TYLENOL Take 1,000 mg by mouth every 6 (six) hours as needed for moderate pain or headache.   amiodarone 200 MG tablet Commonly known as:  PACERONE Take 1 tablet (200 mg total) by mouth 2 (two) times daily. What changed:  how much to take  when to take this   amoxicillin 500 MG capsule Commonly known as:  AMOXIL Take four capsules one hour before dental appointment.   aspirin 81 MG EC tablet Take 1 tablet (81 mg total) by mouth daily.   chlorhexidine 0.12 % solution Commonly known as:  PERIDEX Rinse with 15 mls twice daily for 30 seconds. Use after breakfast and at bedtime. Spit out excess. Do not swallow.   furosemide 40 MG tablet Commonly known as:  LASIX Take 1 tablet (40 mg total) by mouth daily. What changed:  medication strength  how much to take   lisinopril 2.5 MG tablet Commonly known as:  PRINIVIL,ZESTRIL Take 1 tablet (2.5 mg total) by mouth 2 (two) times daily.   multivitamin tablet Take 1 tablet by mouth daily.   spironolactone 25 MG tablet Commonly known as:  ALDACTONE Take 1 tablet (25 mg total) by mouth daily.   traMADol 50 MG tablet Commonly known as:  ULTRAM Take 1-2 tablets (50-100 mg total) by mouth every 4 (four) hours as needed for moderate pain.   warfarin 5 MG tablet Commonly known as:  COUMADIN Take 1 tablet (5 mg total) by mouth daily at 6 PM.            Durable Medical Equipment        Start     Ordered   11/15/16 1051  For home use only DME Walker rolling  Once    Question:  Patient needs a walker to treat with the following condition  Answer:  S/P MVR (mitral valve repair)    11/15/16 1051     The patient has been discharged on:   1.Beta Blocker:  Yes [   ]                              No   [ x  ]                              If No, reason:Bradycardia and junctional rhythms  2.Ace Inhibitor/ARB: Yes [ x  ]                                     No  [    ]                                     If  No, reason:  3.Statin:   Yes [   ]                  No  [x   ]                  If No, reason:No CAD  4.Marlowe Kays:  Yes  [  x ]                  No   [   ]                  If No, reason:  Follow Up Appointments: Follow-up Information    Purcell Nails, MD Follow up on 11/29/2016.   Specialty:  Cardiothoracic Surgery Why:  PA/LAT CXR to be taken (at [facility] which is in the same building as Dr. Orvan July office) on 11/29/2016 at 1:00 pm;Appointment time is at 1:30 pm Contact information: [address] [place] [address] [phone]        Peter Swaziland, MD Follow up on 01/18/2017.   Specialty:  Cardiology Why:  Appointment time is at 9:00 am   Contact information: [address] [place] [address] [phone]        Nicolasa Ducking, NP Follow up on 12/01/2016.   Specialties:  Nurse Practitioner, Cardiology, Radiology Why:  Appointment time is at 9:00 am Contact information: [address] [place] [address] [phone]        [facility] [address] [facility] Follow up on 11/22/2016.   Specialty:  Cardiology Why:  Appointment time is 2:30pm and is to have PT and INR (as is on Coumadin) drawn. INR 2.5-3.5 with a goal of 3 for mechanical mitral valve. Contact information: [address] [place] [address] [phone]       Lewayne Bunting, MD Follow up on 12/01/2016.   Specialty:  Cardiology Why:  4:00PM, wound check/nursing visit Contact information: [address] [place] [address] [phone]           Signed: Marrion Coy 11/20/2016, 10:35 AM

## 2016-11-18 NOTE — H&P (Signed)
Patient Name: [name] [name] Date of Encounter: 11/18/2016     Principal Problem:   S/P minimally invasive mitral valve replacement with mechanical prosthetic valve + tricuspid valve repair + maze procedure Active Problems:   Rheumatic mitral regurgitation   Non-ischemic cardiomyopathy (HCC)   LAE (left atrial enlargement)   Acute on chronic systolic CHF (congestive heart failure) (HCC)   Atrial fibrillation, persistent (HCC)   Severe mitral regurgitation   S/P minimally invasive tricuspid valve repair   S/P minimally invasive maze operation for atrial fibrillation   Tricuspid regurgitation   Junctional rhythm    SUBJECTIVE  No chest pain or sob.  CURRENT MEDS . aspirin EC  81 mg Oral Daily  . bisacodyl  10 mg Oral Daily   Or  . bisacodyl  10 mg Rectal Daily  .  ceFAZolin (ANCEF) IV  2 g Intravenous To SSTC  . diclofenac sodium  2 g Topical QID  . docusate sodium  200 mg Oral Daily  . folic acid-pyridoxine-cyancobalamin  1 tablet Oral Daily  . furosemide  40 mg Intravenous BID  . gentamicin irrigation  80 mg Irrigation To SSTC  . iron polysaccharides  150 mg Oral Daily  . lisinopril  2.5 mg Oral BID  . mouth rinse  15 mL Mouth Rinse BID  . pantoprazole  40 mg Oral Daily  . potassium chloride  20 mEq Oral BID  . sodium chloride flush  10-40 mL Intracatheter Q12H  . sodium chloride flush  3 mL Intravenous Q12H  . sodium chloride flush  3 mL Intravenous Q12H  . spironolactone  12.5 mg Oral Daily  . warfarin  5 mg Oral q1800  . Warfarin - Physician Dosing Inpatient   Does not apply q1800    OBJECTIVE  Vitals:   11/17/16 0947 11/17/16 1319 11/17/16 2025 11/18/16 0446  BP: 131/61 116/61 (!) 118/42 (!) 122/49  Pulse:  (!) 50 61 (!) 59  Resp:  18 18 18   Temp:  97.6 F (36.4 C) 97.8 F (36.6 C) 97.6 F (36.4 C)  TempSrc:  Oral Oral Oral  SpO2:  97% 99% 97%  Weight:    166 lb 3.2 oz (75.4 kg)  Height:        Intake/Output Summary (Last 24 hours) at  11/18/16 0913 Last data filed at 11/18/16 0319  Gross per 24 hour  Intake           276.19 ml  Output             1700 ml  Net         -1423.81 ml   Filed Weights   11/16/16 0503 11/17/16 0442 11/18/16 0446  Weight: 169 lb 3.2 oz (76.7 kg) 166 lb 8 oz (75.5 kg) 166 lb 3.2 oz (75.4 kg)    PHYSICAL EXAM  General: Pleasant, NAD. Neuro: Alert and oriented X 3. Moves all extremities spontaneously. Psych: Normal affect. HEENT:  Normal  Neck: Supple without bruits or JVD. Lungs:  Resp regular and unlabored, CTA. Heart: RRR no s3, s4, or murmurs. Abdomen: Soft, non-tender, non-distended, BS + x 4.  Extremities: No clubbing, cyanosis or edema. DP/PT/Radials 2+ and equal bilaterally.  Accessory Clinical Findings  CBC  Recent Labs  11/17/16 1138 11/18/16 0418  WBC 13.1* 11.8*  HGB 10.2* 10.0*  HCT 30.8* 30.7*  MCV 90.9 90.8  PLT 229 260   Basic Metabolic Panel  Recent Labs  11/17/16 1138 11/18/16 0418  NA 135 134*  K  3.9 4.3  CL 95* 96*  CO2 31 31  GLUCOSE 103* 95  BUN 19 19  CREATININE 0.68 0.67  CALCIUM 8.5* 8.6*   Liver Function Tests No results for input(s): AST, ALT, ALKPHOS, BILITOT, PROT, ALBUMIN in the last 72 hours. No results for input(s): LIPASE, AMYLASE in the last 72 hours. Cardiac Enzymes No results for input(s): CKTOTAL, CKMB, CKMBINDEX, TROPONINI in the last 72 hours. BNP Invalid input(s): POCBNP D-Dimer No results for input(s): DDIMER in the last 72 hours. Hemoglobin A1C No results for input(s): HGBA1C in the last 72 hours. Fasting Lipid Panel No results for input(s): CHOL, HDL, LDLCALC, TRIG, CHOLHDL, LDLDIRECT in the last 72 hours. Thyroid Function Tests No results for input(s): TSH, T4TOTAL, T3FREE, THYROIDAB in the last 72 hours.  Invalid input(s): FREET3  TELE  Atrial fib with ventricular pacing. Underlying HR without PPM is 45-50 with pauses  Radiology/Studies  Dg Chest 2 View  Result Date: 11/17/2016 CLINICAL DATA:   Right-sided chest tube removal, followup EXAM: CHEST  2 VIEW COMPARISON:  Chest x-ray of 11/15/2016 FINDINGS: After right chest tube removal no definite right pneumothorax is seen. There is some linear atelectasis at the right lung base. Airspace disease has improved within the left mid and upper lung and at the right lung base in the interval. Cardiomegaly is stable and mitral valve replacement is noted with aortic valve replacement and left atrial appendage exclusion device noted as well. There appears to be a small left pleural effusion. Right PICC line tip overlies the lower SVC. IMPRESSION: 1. After removal of the right chest tube no definite right pneumothorax is seen. 2. Some improvement in airspace disease in the left mid and upper lung and the right lung base. 3. Stable cardiomegaly with probable small left pleural effusion. Electronically Signed   By: Dwyane Dee M.D.   On: 11/17/2016 16:18   Dg Chest 2 View  Addendum Date: 11/09/2016   ADDENDUM REPORT: 11/09/2016 14:56 ADDENDUM: Review of a CT scan performed on November 02, 2016, on Mr. Sedler but reported under a different medical record number, reveals no abnormality in the area of clinical concern. Thus the CT scan that I recommended to be done prior to the anticipated surgical procedure is not necessary. I apologize for the confusion that this has caused. Electronically Signed   By: David  [place] M.D.   On: 11/09/2016 14:56   Result Date: 11/09/2016 CLINICAL DATA:  Preoperative examination prior mitral valve surgery. Discontinued smoking 1 month ago. Atrial fibrillation, CHF. EXAM: CHEST  2 VIEW COMPARISON:  None in PACs FINDINGS: The lungs are adequately inflated. There is subtle increased density in the retrosternal region inferiorly which may lie on the right. The left lung is clear. The cardiac silhouette is mildly enlarged. The pulmonary vascularity is normal. The mediastinum is normal in width. The bony thorax exhibits no acute  abnormality. IMPRESSION: Mild right-sided infrahilar/retrosternal soft tissue prominence that that may reflect atelectasis or pneumonia or a soft tissue mass. Chest CT scanning is recommended prior the patient's surgical procedure. Mild cardiomegaly without pulmonary edema.  No pleural effusion. These results will be called to the ordering clinician or representative by the Radiologist Assistant, and communication documented in the PACS or zVision Dashboard. Electronically Signed: By: David  [place] M.D. On: 11/09/2016 10:13   Dg Chest Port 1 View  Result Date: 11/15/2016 CLINICAL DATA:  61 year old male with a history of chest tube. Status post mitral valve replacement. Surgery 11/11/2016, with minimally invasive mitral valve  replacement, tricuspid valve repair, Maze procedure and left atrial appendage amputation/clip. EXAM: PORTABLE CHEST 1 VIEW COMPARISON:  11/14/2016, 11/13/2016, 11/12/2016 FINDINGS: Cardiomediastinal silhouette is unchanged, with postoperative changes of atrial appendage clipping, mitral valve and tricuspid valve repair. Unchanged position of 2 left-sided pleural/mediastinal drains. Unchanged epicardial pacing leads. Slight worsening of left upper lung airspace opacity with mild air bronchograms. Improving right lower lobe interstitial and airspace opacity since the study dated 11/12/2016. No visualized pneumothorax.  No large pleural effusion. IMPRESSION: Since yesterday's chest x-ray there is slight worsening of airspace disease of the left upper lung. There is continued improvement of the right lower lobe interstitial/airspace opacity from the study dated 11/12/2016. Re- demonstration of 2 right-sided pleural/mediastinal drains. Unchanged epicardial pacing leads. No pneumothorax. Surgical changes of atrial clipping, tricuspid and mitral valve repair. Signed, Yvone Neu. Loreta Ave, DO [facility] Electronically Signed   By: Gilmer Mor  D.O.   On: 11/15/2016 07:33   Dg Chest Port 1 View  Result Date: 11/14/2016 CLINICAL DATA:  Status post mitral and tricuspid valve replacement and Maze procedure EXAM: PORTABLE CHEST 1 VIEW COMPARISON:  11/13/2016 FINDINGS: Postsurgical changes are again seen and stable. Right-sided PICC line is again identified and has been withdrawn to the cavoatrial junction. Right thoracostomy catheter and pericardial drain are again noted and stable. Diffuse increased density is noted throughout both lungs stable from the prior exam. No new focal infiltrate is noted. No pneumothorax is seen. IMPRESSION: Postsurgical changes. Tubes and lines as described. Patchy opacities throughout both lungs relatively stable from the prior exam likely representing a degree of edema. Continued follow-up is recommended. Electronically Signed   By: Alcide Clever M.D.   On: 11/14/2016 07:16   Dg Chest Port 1 View  Addendum Date: 11/13/2016   ADDENDUM REPORT: 11/13/2016 13:04 ADDENDUM: Tip of PICC is 4.5 cm distal to the superior cavoatrial junction. Electronically Signed   By: Trudie Reed M.D.   On: 11/13/2016 13:04   Result Date: 11/13/2016 CLINICAL DATA:  61 year old male status post central line placement. EXAM: PORTABLE CHEST 1 VIEW COMPARISON:  Chest x-ray 11/13/2016. FINDINGS: Left IJ Cordis with tip terminating in the left innominate vein. Right upper extremity PICC with tip terminating in the right atrium. Two right-sided chest tubes in position, what with tip near the apex of the right hemithorax and other with tip projecting slightly to the left of midline (likely projectional). No appreciable right-sided pneumothorax. Lung volumes are low. Widespread interstitial and airspace disease is noted throughout the lungs bilaterally, asymmetrically distributed involving the right mid to lower lung, and relatively diffuse involvement of the left lung, with sparing of the right upper lobe. Pulmonary vasculature does not appear  engorged. No definite pleural effusions. Heart size is mildly enlarged. Status post mechanical mitral valve replacements and tricuspid annuloplasty. Status post left atrial appendage ligation with left atrial clip in place. IMPRESSION: 1. Postoperative changes and support apparatus, as above. 2. Persistent patchy multifocal asymmetrically distributed interstitial and airspace disease throughout the lungs bilaterally. Findings could reflect asymmetric noncardiogenic edema, or could indicate infectious consolidation. Attention on followup studies is recommended. 3. Mild cardiomegaly. Electronically Signed: By: Trudie Reed M.D. On: 11/13/2016 12:47   Dg Chest Port 1 View  Result Date: 11/13/2016 CLINICAL DATA:  Atelectasis. Short of breath. Subsequent encounter. EXAM: PORTABLE CHEST 1 VIEW COMPARISON:  11/12/2016 and older exams FINDINGS: There is new patchy airspace opacity in the left mid to upper lung. Consolidation in the right  mid to lower lung is stable. No pneumothorax. Two right-sided chest tubes are stable. Swan-Ganz catheter has been removed. The left internal jugular Cordis remains in place. There is no mediastinal widening. Changes from the recent surgery and cardiac valve replacement are stable. IMPRESSION: 1. Worsening lung aeration with new airspace opacity developing in the left mid to upper lung. This may reflect asymmetric edema. Pneumonia or aspiration pneumonitis is possible. 2. Status post removal of the Swan-Ganz catheter. Left internal jugular introducer sheath remains in place. 3. No other change. 4. Stable right chest tubes. No pneumothorax. Persistent right mid to lower lung zone airspace opacity. Persistent medial left lung base atelectasis. Electronically Signed   By: Amie Portland M.D.   On: 11/13/2016 07:56   Dg Chest Port 1 View  Result Date: 11/12/2016 CLINICAL DATA:  Cardiac surgery, chest tube EXAM: PORTABLE CHEST 1 VIEW COMPARISON:  Chest radiograph from one day prior.  FINDINGS: Right apical chest tube is in place. A stable separate tube terminates over the left lower mediastinum. Interval extubation. Left internal jugular Swan-Ganz catheter terminates over the right pulmonary artery. Cardiac valvular prostheses are in place. Stable cardiomediastinal silhouette with mild cardiomegaly. No pneumothorax. No pleural effusion. There is worsening patchy consolidation at the right greater than left lung bases. IMPRESSION: 1. Support structures as described.  No pneumothorax. 2. Stable mild cardiomegaly. Worsening patchy consolidation at the right greater than left lung bases, for which the differential includes worsening atelectasis, aspiration and/or pneumonia. Electronically Signed   By: Delbert Phenix M.D.   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 Nasogastric tube extends into stomach. LEFT jugular Swan-Ganz catheter tip projects over proximal descending interlobar pulmonary artery. Epicardial pacing wires present. Mediastinal drain and RIGHT thoracostomy tube present. Enlargement of cardiac silhouette post MVR and TVR. LEFT atrial appendage clip noted. Mild pulmonary vascular congestion. Probable RIGHT perihilar edema with small RIGHT pleural effusion and mild mid lung atelectasis. No definite pneumothorax. Osseous structures unremarkable. IMPRESSION: Postoperative changes as above with mild atelectasis and asymmetric RIGHT lung edema. Electronically Signed   By: Ulyses Southward M.D.   On: 11/11/2016 16:25   Ct Angio Chest Aorta W &/or Wo Contrast  Result Date: 11/02/2016 CLINICAL DATA:  61 year old male with  history of severe mitral regurgitation. Preoperative evaluation prior to potential repair or replacement. EXAM: CT ANGIOGRAPHY CHEST, ABDOMEN AND PELVIS TECHNIQUE: Multidetector CT imaging through the chest, abdomen and pelvis was performed using the standard protocol during bolus administration of intravenous contrast. Multiplanar reconstructed images and MIPs were obtained and reviewed to evaluate the vascular anatomy. CONTRAST:  75 mL of Isovue 370. COMPARISON:  No priors. FINDINGS: CTA CHEST FINDINGS Cardiovascular: Heart size is enlarged with moderate left ventricular and left atrial dilatation. There is no significant pericardial fluid, thickening or pericardial calcification. Mild atherosclerosis of the thoracic aorta without evidence of aneurysm or dissection. No coronary artery calcifications. Ascending, mid arch and descending thoracic aorta measure 2.8 cm, 2.7 cm, and 2.6 cm in diameter respectively. No definite calcifications of the mitral annulus or mitral valve (noncontrast images were not obtained). No calcifications of the aortic valve. Mediastinum/Lymph Nodes: No pathologically enlarged mediastinal or hilar lymph nodes.  Esophagus is unremarkable in appearance. No axillary lymphadenopathy. Lungs/Pleura: Small loculated pneumothorax in the apex of the left hemithorax adjacent to several small blebs. This occupies approximately 5% of the volume of the left hemithorax. Mild paraseptal emphysema. No acute consolidative airspace disease. No pleural effusions. Linear scarring in the inferior segment of the lingula. In the apex of the right upper lobe there is a 8 x 6 mm (mean diameter of 7 mm) area of pleuroparenchymal nodular scarring. No other suspicious appearing pulmonary nodules or masses are noted. There are no aggressive appearing lytic or blastic lesions noted in the visualized portions of the skeleton. Musculoskeletal/Soft Tissues: There are no aggressive appearing lytic or blastic lesions noted  in the visualized portions of the skeleton. CTA ABDOMEN AND PELVIS FINDINGS Hepatobiliary: No suspicious cystic or solid hepatic lesions. No intra or extrahepatic biliary ductal dilatation. Gallbladder is normal in appearance. Pancreas: No pancreatic mass. No pancreatic ductal dilatation. No pancreatic or peripancreatic fluid or inflammatory changes. Spleen: Unremarkable. Adrenals/Urinary Tract: Bilateral kidneys and bilateral adrenal glands are normal in appearance. No hydroureteronephrosis or perinephric stranding to indicate urinary tract obstruction at this time. Urinary bladder is normal in appearance. Stomach/Bowel: Normal appearance of the stomach. No pathologic dilatation of small bowel or colon. The appendix is not confidently identified and may be surgically absent. Regardless, there are no inflammatory changes noted adjacent to the cecum to suggest the presence of an acute appendicitis at this time. Vascular/Lymphatic: Aortic atherosclerosis, without evidence of aneurysm, dissection wall or hemodynamically significant stenosis in the abdominal or pelvic vasculature. No lymphadenopathy noted in the abdomen or pelvis. Reproductive: Prostate gland and seminal vesicles are normal in appearance. Other: Trace volume of ascites.  No pneumoperitoneum. Musculoskeletal: There are no aggressive appearing lytic or blastic lesions noted in the visualized portions of the skeleton. Review of the MIP images confirms the above findings. IMPRESSION: 1. Cardiomegaly with moderate left ventricular and left atrial dilatation, compatible with the reported clinical history of mitral valve disease. No definite calcifications associated with the mitral annulus or the mitral valve on today's contrast enhanced examination. 2. Small left apical pneumothorax occupying less than 5% of the volume of the left hemithorax. This is likely loculated in the left apex, and given the appearance on the prior chest x-ray may be chronic,  potentially related to prior rupture of a small subpleural bleb as the patient does have some very mild paraseptal emphysema. This is of questionable clinical significance, but clinical correlation is recommended. 3. 7 mm subpleural nodule in the apex of the right upper lobe favored to represent a small focus of pleuroparenchymal scarring. Non-contrast chest CT at 6-12 months is recommended. If the nodule is stable at time of repeat CT, then future CT at 18-24 months (from today's scan) is considered optional for low-risk patients, but is recommended for high-risk patients. This recommendation follows the consensus statement: Guidelines for Management of Incidental Pulmonary Nodules Detected on CT Images: From the Fleischner Society 2017; Radiology 2017; 284:228-243. 4. Aortic atherosclerosis (mild), without evidence of aneurysm or dissection. 5. Additional incidental findings, as above. Critical Value/emergent results were called by telephone at the time of interpretation on 11/02/2016 at 12:54 pm to Dr. Tressie Stalker, who verbally acknowledged these results. Electronically Signed   By: Trudie Reed M.D.   On: 11/02/2016 12:58   Ct Angio Abd/pel W/ And/or W/o  Result Date: 11/02/2016 CLINICAL DATA:  61 year old male with history of severe mitral regurgitation. Preoperative evaluation prior to potential repair or replacement. EXAM:  CT ANGIOGRAPHY CHEST, ABDOMEN AND PELVIS TECHNIQUE: Multidetector CT imaging through the chest, abdomen and pelvis was performed using the standard protocol during bolus administration of intravenous contrast. Multiplanar reconstructed images and MIPs were obtained and reviewed to evaluate the vascular anatomy. CONTRAST:  75 mL of Isovue 370. COMPARISON:  No priors. FINDINGS: CTA CHEST FINDINGS Cardiovascular: Heart size is enlarged with moderate left ventricular and left atrial dilatation. There is no significant pericardial fluid, thickening or pericardial calcification. Mild  atherosclerosis of the thoracic aorta without evidence of aneurysm or dissection. No coronary artery calcifications. Ascending, mid arch and descending thoracic aorta measure 2.8 cm, 2.7 cm, and 2.6 cm in diameter respectively. No definite calcifications of the mitral annulus or mitral valve (noncontrast images were not obtained). No calcifications of the aortic valve. Mediastinum/Lymph Nodes: No pathologically enlarged mediastinal or hilar lymph nodes. Esophagus is unremarkable in appearance. No axillary lymphadenopathy. Lungs/Pleura: Small loculated pneumothorax in the apex of the left hemithorax adjacent to several small blebs. This occupies approximately 5% of the volume of the left hemithorax. Mild paraseptal emphysema. No acute consolidative airspace disease. No pleural effusions. Linear scarring in the inferior segment of the lingula. In the apex of the right upper lobe there is a 8 x 6 mm (mean diameter of 7 mm) area of pleuroparenchymal nodular scarring. No other suspicious appearing pulmonary nodules or masses are noted. There are no aggressive appearing lytic or blastic lesions noted in the visualized portions of the skeleton. Musculoskeletal/Soft Tissues: There are no aggressive appearing lytic or blastic lesions noted in the visualized portions of the skeleton. CTA ABDOMEN AND PELVIS FINDINGS Hepatobiliary: No suspicious cystic or solid hepatic lesions. No intra or extrahepatic biliary ductal dilatation. Gallbladder is normal in appearance. Pancreas: No pancreatic mass. No pancreatic ductal dilatation. No pancreatic or peripancreatic fluid or inflammatory changes. Spleen: Unremarkable. Adrenals/Urinary Tract: Bilateral kidneys and bilateral adrenal glands are normal in appearance. No hydroureteronephrosis or perinephric stranding to indicate urinary tract obstruction at this time. Urinary bladder is normal in appearance. Stomach/Bowel: Normal appearance of the stomach. No pathologic dilatation of small  bowel or colon. The appendix is not confidently identified and may be surgically absent. Regardless, there are no inflammatory changes noted adjacent to the cecum to suggest the presence of an acute appendicitis at this time. Vascular/Lymphatic: Aortic atherosclerosis, without evidence of aneurysm, dissection wall or hemodynamically significant stenosis in the abdominal or pelvic vasculature. No lymphadenopathy noted in the abdomen or pelvis. Reproductive: Prostate gland and seminal vesicles are normal in appearance. Other: Trace volume of ascites.  No pneumoperitoneum. Musculoskeletal: There are no aggressive appearing lytic or blastic lesions noted in the visualized portions of the skeleton. Review of the MIP images confirms the above findings. IMPRESSION: 1. Cardiomegaly with moderate left ventricular and left atrial dilatation, compatible with the reported clinical history of mitral valve disease. No definite calcifications associated with the mitral annulus or the mitral valve on today's contrast enhanced examination. 2. Small left apical pneumothorax occupying less than 5% of the volume of the left hemithorax. This is likely loculated in the left apex, and given the appearance on the prior chest x-ray may be chronic, potentially related to prior rupture of a small subpleural bleb as the patient does have some very mild paraseptal emphysema. This is of questionable clinical significance, but clinical correlation is recommended. 3. 7 mm subpleural nodule in the apex of the right upper lobe favored to represent a small focus of pleuroparenchymal scarring. Non-contrast chest CT at 6-12 months is  recommended. If the nodule is stable at time of repeat CT, then future CT at 18-24 months (from today's scan) is considered optional for low-risk patients, but is recommended for high-risk patients. This recommendation follows the consensus statement: Guidelines for Management of Incidental Pulmonary Nodules Detected on CT  Images: From the Fleischner Society 2017; Radiology 2017; 284:228-243. 4. Aortic atherosclerosis (mild), without evidence of aneurysm or dissection. 5. Additional incidental findings, as above. Critical Value/emergent results were called by telephone at the time of interpretation on 11/02/2016 at 12:54 pm to Dr. Tressie Stalker, who verbally acknowledged these results. Electronically Signed   By: Trudie Reed M.D.   On: 11/02/2016 12:58    ASSESSMENT AND PLAN  1. Atrial fib - his ventricular rate is slow. He has undergone a MAZE procedure. He is on anti-coagulation. 2. Complete heart block - he now has some conduction but he is slow. I have discussed with Dr. Cornelius Moras. Will plan to proceed with PPM (Biv) with the expectation that he be started on amiodarone for 2-3 months to get him back to NSR 3. Sinus node dysfunction - postop he had no sinus node activity. He will require atrial pacing 4. Chronic systolic heart failure - post op Echo shows EF 35-40%. Will plan to place a biv device.  Gregg Taylor,M.D.  11/18/2016 9:13 AM

## 2016-11-18 NOTE — Progress Notes (Signed)
[phone] Pt had been off oxygen for a short time and had been in bathroom. Checked sats on finger at 84%RA and then I tried his ear at 87-88%RA. He stated he felt a little SOB. Encouraged RN to take pt on oxygen to lab. Will have to evaluate sats with ambulating tomorrow in case he needs home oxygen. Luetta Nutting RN BSN 11/18/2016 9:26 AM

## 2016-11-18 NOTE — Progress Notes (Signed)
Patient ID: [name] [name], male   DOB: [birth year], 61 y.o.   MRN: [id number]   SUBJECTIVE: [employer] CRT-P device placed this morning, no complaints this afternoon.  Echo (11/16/16): EF 40%, mild LVH, diffuse hypokinesis, mechanical mitral valve not fully visualized.   Scheduled Meds: . amiodarone  200 mg Oral BID  . aspirin EC  81 mg Oral Daily  . bisacodyl  10 mg Oral Daily   Or  . bisacodyl  10 mg Rectal Daily  . diclofenac sodium  2 g Topical QID  . docusate sodium  200 mg Oral Daily  . folic acid-pyridoxine-cyancobalamin  1 tablet Oral Daily  . furosemide  40 mg Intravenous BID  . iron polysaccharides  150 mg Oral Daily  . lisinopril  2.5 mg Oral BID  . mouth rinse  15 mL Mouth Rinse BID  . pantoprazole  40 mg Oral Daily  . potassium chloride  20 mEq Oral BID  . sodium chloride flush  10-40 mL Intracatheter Q12H  . sodium chloride flush  3 mL Intravenous Q12H  . sodium chloride flush  3 mL Intravenous Q12H  . spironolactone  12.5 mg Oral Daily  . warfarin  5 mg Oral q1800  . Warfarin - Physician Dosing Inpatient   Does not apply q1800   Continuous Infusions: . sodium chloride Stopped (11/12/16 1400)   PRN Meds:.sodium chloride, chlorpheniramine-HYDROcodone, metoprolol, ondansetron (ZOFRAN) IV, oxyCODONE, sodium chloride flush, sodium chloride flush, sodium chloride flush, sorbitol, traMADol    Vitals:   11/18/16 1203 11/18/16 1204 11/18/16 1209 11/18/16 1237  BP:  133/73 (!) 181/51 (!) 108/94  Pulse:  60 60 60  Resp:  12 19   Temp:    98.1 F (36.7 C)  TempSrc:      SpO2: 97% 95% 96% 97%  Weight:      Height:        Intake/Output Summary (Last 24 hours) at 11/18/16 1332 Last data filed at 11/18/16 0319  Gross per 24 hour  Intake              240 ml  Output             1450 ml  Net            -1210 ml    LABS: Basic Metabolic Panel:  Recent Labs  40/98/11 1138 11/18/16 0418  NA 135 134*  K 3.9 4.3  CL 95* 96*  CO2 31 31  GLUCOSE 103* 95  BUN 19 19   CREATININE 0.68 0.67  CALCIUM 8.5* 8.6*   Liver Function Tests: No results for input(s): AST, ALT, ALKPHOS, BILITOT, PROT, ALBUMIN in the last 72 hours. No results for input(s): LIPASE, AMYLASE in the last 72 hours. CBC:  Recent Labs  11/17/16 1138 11/18/16 0418  WBC 13.1* 11.8*  HGB 10.2* 10.0*  HCT 30.8* 30.7*  MCV 90.9 90.8  PLT 229 260   Cardiac Enzymes: No results for input(s): CKTOTAL, CKMB, CKMBINDEX, TROPONINI in the last 72 hours. BNP: Invalid input(s): POCBNP D-Dimer: No results for input(s): DDIMER in the last 72 hours. Hemoglobin A1C: No results for input(s): HGBA1C in the last 72 hours. Fasting Lipid Panel: No results for input(s): CHOL, HDL, LDLCALC, TRIG, CHOLHDL, LDLDIRECT in the last 72 hours. Thyroid Function Tests: No results for input(s): TSH, T4TOTAL, T3FREE, THYROIDAB in the last 72 hours.  Invalid input(s): FREET3 Anemia Panel: No results for input(s): VITAMINB12, FOLATE, FERRITIN, TIBC, IRON, RETICCTPCT in the last 72 hours.  RADIOLOGY: Dg  Chest 2 View  Result Date: 11/17/2016 CLINICAL DATA:  Right-sided chest tube removal, followup EXAM: CHEST  2 VIEW COMPARISON:  Chest x-ray of 11/15/2016 FINDINGS: After right chest tube removal no definite right pneumothorax is seen. There is some linear atelectasis at the right lung base. Airspace disease has improved within the left mid and upper lung and at the right lung base in the interval. Cardiomegaly is stable and mitral valve replacement is noted with aortic valve replacement and left atrial appendage exclusion device noted as well. There appears to be a small left pleural effusion. Right PICC line tip overlies the lower SVC. IMPRESSION: 1. After removal of the right chest tube no definite right pneumothorax is seen. 2. Some improvement in airspace disease in the left mid and upper lung and the right lung base. 3. Stable cardiomegaly with probable small left pleural effusion. Electronically Signed   By:  Dwyane Dee M.D.   On: 11/17/2016 16:18   Dg Chest 2 View  Addendum Date: 11/09/2016   ADDENDUM REPORT: 11/09/2016 14:56 ADDENDUM: Review of a CT scan performed on November 02, 2016, on Mr. Moncrief but reported under a different medical record number, reveals no abnormality in the area of clinical concern. Thus the CT scan that I recommended to be done prior to the anticipated surgical procedure is not necessary. I apologize for the confusion that this has caused. Electronically Signed   By: David  [place] M.D.   On: 11/09/2016 14:56   Result Date: 11/09/2016 CLINICAL DATA:  Preoperative examination prior mitral valve surgery. Discontinued smoking 1 month ago. Atrial fibrillation, CHF. EXAM: CHEST  2 VIEW COMPARISON:  None in PACs FINDINGS: The lungs are adequately inflated. There is subtle increased density in the retrosternal region inferiorly which may lie on the right. The left lung is clear. The cardiac silhouette is mildly enlarged. The pulmonary vascularity is normal. The mediastinum is normal in width. The bony thorax exhibits no acute abnormality. IMPRESSION: Mild right-sided infrahilar/retrosternal soft tissue prominence that that may reflect atelectasis or pneumonia or a soft tissue mass. Chest CT scanning is recommended prior the patient's surgical procedure. Mild cardiomegaly without pulmonary edema.  No pleural effusion. These results will be called to the ordering clinician or representative by the Radiologist Assistant, and communication documented in the PACS or zVision Dashboard. Electronically Signed: By: David  [place] M.D. On: 11/09/2016 10:13   Dg Chest Port 1 View  Result Date: 11/15/2016 CLINICAL DATA:  61 year old male with a history of chest tube. Status post mitral valve replacement. Surgery 11/11/2016, with minimally invasive mitral valve replacement, tricuspid valve repair, Maze procedure and left atrial appendage amputation/clip. EXAM: PORTABLE CHEST 1 VIEW COMPARISON:   11/14/2016, 11/13/2016, 11/12/2016 FINDINGS: Cardiomediastinal silhouette is unchanged, with postoperative changes of atrial appendage clipping, mitral valve and tricuspid valve repair. Unchanged position of 2 left-sided pleural/mediastinal drains. Unchanged epicardial pacing leads. Slight worsening of left upper lung airspace opacity with mild air bronchograms. Improving right lower lobe interstitial and airspace opacity since the study dated 11/12/2016. No visualized pneumothorax.  No large pleural effusion. IMPRESSION: Since yesterday's chest x-ray there is slight worsening of airspace disease of the left upper lung. There is continued improvement of the right lower lobe interstitial/airspace opacity from the study dated 11/12/2016. Re- demonstration of 2 right-sided pleural/mediastinal drains. Unchanged epicardial pacing leads. No pneumothorax. Surgical changes of atrial clipping, tricuspid and mitral valve repair. Signed, Yvone Neu. Loreta Ave, DO [facility] Electronically Signed   By: Marijean Niemann  Loreta Ave D.O.   On: 11/15/2016 07:33   Dg Chest Port 1 View  Result Date: 11/14/2016 CLINICAL DATA:  Status post mitral and tricuspid valve replacement and Maze procedure EXAM: PORTABLE CHEST 1 VIEW COMPARISON:  11/13/2016 FINDINGS: Postsurgical changes are again seen and stable. Right-sided PICC line is again identified and has been withdrawn to the cavoatrial junction. Right thoracostomy catheter and pericardial drain are again noted and stable. Diffuse increased density is noted throughout both lungs stable from the prior exam. No new focal infiltrate is noted. No pneumothorax is seen. IMPRESSION: Postsurgical changes. Tubes and lines as described. Patchy opacities throughout both lungs relatively stable from the prior exam likely representing a degree of edema. Continued follow-up is recommended. Electronically Signed   By: Alcide Clever M.D.   On: 11/14/2016 07:16     Dg Chest Port 1 View  Addendum Date: 11/13/2016   ADDENDUM REPORT: 11/13/2016 13:04 ADDENDUM: Tip of PICC is 4.5 cm distal to the superior cavoatrial junction. Electronically Signed   By: Trudie Reed M.D.   On: 11/13/2016 13:04   Result Date: 11/13/2016 CLINICAL DATA:  61 year old male status post central line placement. EXAM: PORTABLE CHEST 1 VIEW COMPARISON:  Chest x-ray 11/13/2016. FINDINGS: Left IJ Cordis with tip terminating in the left innominate vein. Right upper extremity PICC with tip terminating in the right atrium. Two right-sided chest tubes in position, what with tip near the apex of the right hemithorax and other with tip projecting slightly to the left of midline (likely projectional). No appreciable right-sided pneumothorax. Lung volumes are low. Widespread interstitial and airspace disease is noted throughout the lungs bilaterally, asymmetrically distributed involving the right mid to lower lung, and relatively diffuse involvement of the left lung, with sparing of the right upper lobe. Pulmonary vasculature does not appear engorged. No definite pleural effusions. Heart size is mildly enlarged. Status post mechanical mitral valve replacements and tricuspid annuloplasty. Status post left atrial appendage ligation with left atrial clip in place. IMPRESSION: 1. Postoperative changes and support apparatus, as above. 2. Persistent patchy multifocal asymmetrically distributed interstitial and airspace disease throughout the lungs bilaterally. Findings could reflect asymmetric noncardiogenic edema, or could indicate infectious consolidation. Attention on followup studies is recommended. 3. Mild cardiomegaly. Electronically Signed: By: Trudie Reed M.D. On: 11/13/2016 12:47   Dg Chest Port 1 View  Result Date: 11/13/2016 CLINICAL DATA:  Atelectasis. Short of breath. Subsequent encounter. EXAM: PORTABLE CHEST 1 VIEW COMPARISON:  11/12/2016 and older exams FINDINGS: There is new patchy  airspace opacity in the left mid to upper lung. Consolidation in the right mid to lower lung is stable. No pneumothorax. Two right-sided chest tubes are stable. Swan-Ganz catheter has been removed. The left internal jugular Cordis remains in place. There is no mediastinal widening. Changes from the recent surgery and cardiac valve replacement are stable. IMPRESSION: 1. Worsening lung aeration with new airspace opacity developing in the left mid to upper lung. This may reflect asymmetric edema. Pneumonia or aspiration pneumonitis is possible. 2. Status post removal of the Swan-Ganz catheter. Left internal jugular introducer sheath remains in place. 3. No other change. 4. Stable right chest tubes. No pneumothorax. Persistent right mid to lower lung zone airspace opacity. Persistent medial left lung base atelectasis. Electronically Signed   By: Amie Portland M.D.   On: 11/13/2016 07:56   Dg Chest Port 1 View  Result Date: 11/12/2016 CLINICAL DATA:  Cardiac surgery, chest tube EXAM: PORTABLE CHEST 1 VIEW COMPARISON:  Chest radiograph from one day  prior. FINDINGS: Right apical chest tube is in place. A stable separate tube terminates over the left lower mediastinum. Interval extubation. Left internal jugular Swan-Ganz catheter terminates over the right pulmonary artery. Cardiac valvular prostheses are in place. Stable cardiomediastinal silhouette with mild cardiomegaly. No pneumothorax. No pleural effusion. There is worsening patchy consolidation at the right greater than left lung bases. IMPRESSION: 1. Support structures as described.  No pneumothorax. 2. Stable mild cardiomegaly. Worsening patchy consolidation at the right greater than left lung bases, for which the differential includes worsening atelectasis, aspiration and/or pneumonia. Electronically Signed   By: Delbert Phenix M.D.   On: 11/12/2016 07:37   Dg Chest Port 1 View  Result Date: 11/11/2016 CLINICAL DATA:  Atelectasis, atrial fibrillation, dyspnea,  exertional angina, heart murmur, non ischemic cardiomyopathy, palpitations, 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 Nasogastric tube extends into stomach. LEFT jugular Swan-Ganz catheter tip projects over proximal descending interlobar pulmonary artery. Epicardial pacing wires present. Mediastinal drain and RIGHT thoracostomy tube present. Enlargement of cardiac silhouette post MVR and TVR. LEFT atrial appendage clip noted. Mild pulmonary vascular congestion. Probable RIGHT perihilar edema with small RIGHT pleural effusion and mild mid lung atelectasis. No definite pneumothorax. Osseous structures unremarkable. IMPRESSION: Postoperative changes as above with mild atelectasis and asymmetric RIGHT lung edema. Electronically Signed   By: Ulyses Southward M.D.   On: 11/11/2016 16:25   Ct Angio Chest Aorta W &/or Wo Contrast  Result Date: 11/02/2016 CLINICAL DATA:  61 year old male with history of severe mitral regurgitation. Preoperative evaluation prior to potential repair or replacement. EXAM: CT ANGIOGRAPHY CHEST, ABDOMEN AND PELVIS TECHNIQUE: Multidetector CT imaging through the chest, abdomen and pelvis was performed using the standard protocol during bolus administration of intravenous contrast. Multiplanar reconstructed images and MIPs were obtained and reviewed to evaluate the vascular anatomy. CONTRAST:  75 mL of Isovue 370. COMPARISON:  No priors. FINDINGS: CTA CHEST FINDINGS Cardiovascular: Heart size is enlarged with moderate left ventricular and left atrial dilatation. There is no significant pericardial fluid, thickening or pericardial calcification. Mild atherosclerosis of the thoracic aorta without evidence of aneurysm or dissection. No coronary artery calcifications. Ascending, mid arch and descending thoracic aorta measure 2.8 cm,  2.7 cm, and 2.6 cm in diameter respectively. No definite calcifications of the mitral annulus or mitral valve (noncontrast images were not obtained). No calcifications of the aortic valve. Mediastinum/Lymph Nodes: No pathologically enlarged mediastinal or hilar lymph nodes. Esophagus is unremarkable in appearance. No axillary lymphadenopathy. Lungs/Pleura: Small loculated pneumothorax in the apex of the left hemithorax adjacent to several small blebs. This occupies approximately 5% of the volume of the left hemithorax. Mild paraseptal emphysema. No acute consolidative airspace disease. No pleural effusions. Linear scarring in the inferior segment of the lingula. In the apex of the right upper lobe there is a 8 x 6 mm (mean diameter of 7 mm) area of pleuroparenchymal nodular scarring. No other suspicious appearing pulmonary nodules or masses are noted. There are no aggressive appearing lytic or blastic lesions noted in the visualized portions of the skeleton. Musculoskeletal/Soft Tissues: There are no aggressive appearing lytic or blastic lesions noted in the visualized portions of the skeleton. CTA ABDOMEN AND PELVIS FINDINGS Hepatobiliary: No suspicious cystic or solid hepatic lesions. No intra or extrahepatic biliary ductal dilatation. Gallbladder is normal in appearance. Pancreas: No pancreatic mass. No pancreatic  ductal dilatation. No pancreatic or peripancreatic fluid or inflammatory changes. Spleen: Unremarkable. Adrenals/Urinary Tract: Bilateral kidneys and bilateral adrenal glands are normal in appearance. No hydroureteronephrosis or perinephric stranding to indicate urinary tract obstruction at this time. Urinary bladder is normal in appearance. Stomach/Bowel: Normal appearance of the stomach. No pathologic dilatation of small bowel or colon. The appendix is not confidently identified and may be surgically absent. Regardless, there are no inflammatory changes noted adjacent to the cecum to suggest the  presence of an acute appendicitis at this time. Vascular/Lymphatic: Aortic atherosclerosis, without evidence of aneurysm, dissection wall or hemodynamically significant stenosis in the abdominal or pelvic vasculature. No lymphadenopathy noted in the abdomen or pelvis. Reproductive: Prostate gland and seminal vesicles are normal in appearance. Other: Trace volume of ascites.  No pneumoperitoneum. Musculoskeletal: There are no aggressive appearing lytic or blastic lesions noted in the visualized portions of the skeleton. Review of the MIP images confirms the above findings. IMPRESSION: 1. Cardiomegaly with moderate left ventricular and left atrial dilatation, compatible with the reported clinical history of mitral valve disease. No definite calcifications associated with the mitral annulus or the mitral valve on today's contrast enhanced examination. 2. Small left apical pneumothorax occupying less than 5% of the volume of the left hemithorax. This is likely loculated in the left apex, and given the appearance on the prior chest x-ray may be chronic, potentially related to prior rupture of a small subpleural bleb as the patient does have some very mild paraseptal emphysema. This is of questionable clinical significance, but clinical correlation is recommended. 3. 7 mm subpleural nodule in the apex of the right upper lobe favored to represent a small focus of pleuroparenchymal scarring. Non-contrast chest CT at 6-12 months is recommended. If the nodule is stable at time of repeat CT, then future CT at 18-24 months (from today's scan) is considered optional for low-risk patients, but is recommended for high-risk patients. This recommendation follows the consensus statement: Guidelines for Management of Incidental Pulmonary Nodules Detected on CT Images: From the Fleischner Society 2017; Radiology 2017; 284:228-243. 4. Aortic atherosclerosis (mild), without evidence of aneurysm or dissection. 5. Additional incidental  findings, as above. Critical Value/emergent results were called by telephone at the time of interpretation on 11/02/2016 at 12:54 pm to Dr. Tressie Stalker, who verbally acknowledged these results. Electronically Signed   By: Trudie Reed M.D.   On: 11/02/2016 12:58   Ct Angio Abd/pel W/ And/or W/o  Result Date: 11/02/2016 CLINICAL DATA:  61 year old male with history of severe mitral regurgitation. Preoperative evaluation prior to potential repair or replacement. EXAM: CT ANGIOGRAPHY CHEST, ABDOMEN AND PELVIS TECHNIQUE: Multidetector CT imaging through the chest, abdomen and pelvis was performed using the standard protocol during bolus administration of intravenous contrast. Multiplanar reconstructed images and MIPs were obtained and reviewed to evaluate the vascular anatomy. CONTRAST:  75 mL of Isovue 370. COMPARISON:  No priors. FINDINGS: CTA CHEST FINDINGS Cardiovascular: Heart size is enlarged with moderate left ventricular and left atrial dilatation. There is no significant pericardial fluid, thickening or pericardial calcification. Mild atherosclerosis of the thoracic aorta without evidence of aneurysm or dissection. No coronary artery calcifications. Ascending, mid arch and descending thoracic aorta measure 2.8 cm, 2.7 cm, and 2.6 cm in diameter respectively. No definite calcifications of the mitral annulus or mitral valve (noncontrast images were not obtained). No calcifications of the aortic valve. Mediastinum/Lymph Nodes: No pathologically enlarged mediastinal or hilar lymph nodes. Esophagus is unremarkable in appearance. No axillary lymphadenopathy. Lungs/Pleura: Small loculated pneumothorax  in the apex of the left hemithorax adjacent to several small blebs. This occupies approximately 5% of the volume of the left hemithorax. Mild paraseptal emphysema. No acute consolidative airspace disease. No pleural effusions. Linear scarring in the inferior segment of the lingula. In the apex of the right  upper lobe there is a 8 x 6 mm (mean diameter of 7 mm) area of pleuroparenchymal nodular scarring. No other suspicious appearing pulmonary nodules or masses are noted. There are no aggressive appearing lytic or blastic lesions noted in the visualized portions of the skeleton. Musculoskeletal/Soft Tissues: There are no aggressive appearing lytic or blastic lesions noted in the visualized portions of the skeleton. CTA ABDOMEN AND PELVIS FINDINGS Hepatobiliary: No suspicious cystic or solid hepatic lesions. No intra or extrahepatic biliary ductal dilatation. Gallbladder is normal in appearance. Pancreas: No pancreatic mass. No pancreatic ductal dilatation. No pancreatic or peripancreatic fluid or inflammatory changes. Spleen: Unremarkable. Adrenals/Urinary Tract: Bilateral kidneys and bilateral adrenal glands are normal in appearance. No hydroureteronephrosis or perinephric stranding to indicate urinary tract obstruction at this time. Urinary bladder is normal in appearance. Stomach/Bowel: Normal appearance of the stomach. No pathologic dilatation of small bowel or colon. The appendix is not confidently identified and may be surgically absent. Regardless, there are no inflammatory changes noted adjacent to the cecum to suggest the presence of an acute appendicitis at this time. Vascular/Lymphatic: Aortic atherosclerosis, without evidence of aneurysm, dissection wall or hemodynamically significant stenosis in the abdominal or pelvic vasculature. No lymphadenopathy noted in the abdomen or pelvis. Reproductive: Prostate gland and seminal vesicles are normal in appearance. Other: Trace volume of ascites.  No pneumoperitoneum. Musculoskeletal: There are no aggressive appearing lytic or blastic lesions noted in the visualized portions of the skeleton. Review of the MIP images confirms the above findings. IMPRESSION: 1. Cardiomegaly with moderate left ventricular and left atrial dilatation, compatible with the reported  clinical history of mitral valve disease. No definite calcifications associated with the mitral annulus or the mitral valve on today's contrast enhanced examination. 2. Small left apical pneumothorax occupying less than 5% of the volume of the left hemithorax. This is likely loculated in the left apex, and given the appearance on the prior chest x-ray may be chronic, potentially related to prior rupture of a small subpleural bleb as the patient does have some very mild paraseptal emphysema. This is of questionable clinical significance, but clinical correlation is recommended. 3. 7 mm subpleural nodule in the apex of the right upper lobe favored to represent a small focus of pleuroparenchymal scarring. Non-contrast chest CT at 6-12 months is recommended. If the nodule is stable at time of repeat CT, then future CT at 18-24 months (from today's scan) is considered optional for low-risk patients, but is recommended for high-risk patients. This recommendation follows the consensus statement: Guidelines for Management of Incidental Pulmonary Nodules Detected on CT Images: From the Fleischner Society 2017; Radiology 2017; 284:228-243. 4. Aortic atherosclerosis (mild), without evidence of aneurysm or dissection. 5. Additional incidental findings, as above. Critical Value/emergent results were called by telephone at the time of interpretation on 11/02/2016 at 12:54 pm to Dr. Tressie Stalker, who verbally acknowledged these results. Electronically Signed   By: Trudie Reed M.D.   On: 11/02/2016 12:58    PHYSICAL EXAM General: NAD Neck: JVP 8 cm, no thyromegaly or thyroid nodule.  Lungs: Crackles at bases bilaterally.  CV: Nondisplaced PMI.  Heart regular S1/S2, mechanical S2, no S3/S4, 1/6 SEM RUSB.  No peripheral edema.   Abdomen:  Soft, nontender, no hepatosplenomegaly, no distention.  Neurologic: Alert and oriented x 3.  Psych: Normal affect. Extremities: No clubbing or cyanosis.   TELEMETRY: Reviewed  telemetry, BiV paced without atrial activity noted.   ASSESSMENT AND PLAN: 61 yo with history of rheumatic heart disease and atrial fibrillation now s/p mechanical MV replacement, TV repair, Maze + LA appendage clipping.  He is in a junctional rhythm persistently post-op.  1. Rheumatic heart disease: Moderate to severe MR and moderate to severe TR, symptomatic.  Now s/p mechanical MV replacement and TV repair.   - He is on warfarin and ASA 81 with mechanical MV (goal INR 2.5-3.5).  2. Acute on chronic systolic CHF: TEE pre-op with EF 40-45% and LV-gram with EF 30-35%.  Post-op echo reviewed, EF stable around 40%.  Weight down, volume better on exam. Co-ox 53%.  - Co-ox somewhat marginal at 53% but with stable EF will stay off milrinone.  - Continue Lasix 40 mg IV bid today, to po tomorrow.  - Continue spironolactone and lisinopril at current doses.   3. 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  Patient had a persistent underlying junctional rhythm at around 57 with chronotropic incompetence (HR only rose to 63 walking halls with cardiac rehab yesterday).  Now s/p [employer] CRT-P, no atrial activity noted on monitor.  4. Atrial fibrillation: Now s/p Maze and LA appendage clipping.  See discussion above of rhythm.  Now that he has CRT-P, will add amiodarone 200 mg bid to promote resumption of NSR.   Marca Ancona 11/18/2016 1:32 PM

## 2016-11-19 ENCOUNTER — Inpatient Hospital Stay (HOSPITAL_COMMUNITY): Payer: BLUE CROSS/BLUE SHIELD

## 2016-11-19 LAB — BASIC METABOLIC PANEL
Anion gap: 9 (ref 5–15)
BUN: 19 mg/dL (ref 6–20)
CALCIUM: 8.6 mg/dL — AB (ref 8.9–10.3)
CO2: 27 mmol/L (ref 22–32)
CREATININE: 0.75 mg/dL (ref 0.61–1.24)
Chloride: 98 mmol/L — ABNORMAL LOW (ref 101–111)
Glucose, Bld: 101 mg/dL — ABNORMAL HIGH (ref 65–99)
Potassium: 4.3 mmol/L (ref 3.5–5.1)
SODIUM: 134 mmol/L — AB (ref 135–145)

## 2016-11-19 LAB — PROTIME-INR
INR: 2.44
PROTHROMBIN TIME: 27 s — AB (ref 11.4–15.2)

## 2016-11-19 MED ORDER — FUROSEMIDE 40 MG PO TABS
40.0000 mg | ORAL_TABLET | Freq: Every day | ORAL | Status: DC
  Administered 2016-11-19 – 2016-11-20 (×2): 40 mg via ORAL
  Filled 2016-11-19 (×2): qty 1

## 2016-11-19 MED ORDER — SPIRONOLACTONE 25 MG PO TABS
25.0000 mg | ORAL_TABLET | Freq: Every day | ORAL | Status: DC
  Administered 2016-11-19 – 2016-11-20 (×2): 25 mg via ORAL
  Filled 2016-11-19 (×2): qty 1

## 2016-11-19 NOTE — Progress Notes (Signed)
Dressing changed on CT suture sites. Serosangiunous drainage noted.

## 2016-11-19 NOTE — Progress Notes (Signed)
CARDIAC REHAB PHASE I   PRE:  Rate/Rhythm: paced 72  BP:  Supine: 123/63  Sitting:   Standing:    SaO2: 96%RA  MODE:  Ambulation: 550 ft   POST:  Rate/Rhythm: paced 75  BP:  Supine: 133/62  Sitting:   Standing:    SaO2: 90-91%RA 0942-1030 Pt walked 550 ft independently pushing IV pole with steady gait and did well on RA. To bed after walk. Education completed with pt who voiced understanding. Encouraged IS and walking. Gave CHF booklet and reviewed when to call MD. Gave low sodium handouts and discussed 2000 mg restriction. Discussed briefly watching dark green leafy vegs with Coumadin and I put on Coumadin video for him to view. Needs to see pharmacist. Discussed CRP 2 and referring to [place]. Discussed smoking cessation. Pt plans to quit cold [place]. He has smoking cessation handout and did not want fake cigarette.    Luetta Nutting, RN BSN  11/19/2016 10:26 AM

## 2016-11-19 NOTE — Progress Notes (Signed)
Epicardial pacing wires discontinued per order, pt tolerated well.  Bedrest until 1205pm.

## 2016-11-19 NOTE — Progress Notes (Signed)
Patient ID: [name] [name], male   DOB: 1956/08/20, 61 y.o.   MRN: [id number]   SUBJECTIVE: [employer] CRT-P device placed, now BiV paced at 70 with likely underlying atrial fibrillation.  No dyspnea walking in halls. Weight down.   Echo (11/16/16): EF 40%, mild LVH, diffuse hypokinesis, mechanical mitral valve not fully visualized.   Scheduled Meds: . amiodarone  200 mg Oral BID  . aspirin EC  81 mg Oral Daily  . bisacodyl  10 mg Oral Daily   Or  . bisacodyl  10 mg Rectal Daily  . diclofenac sodium  2 g Topical QID  . docusate sodium  200 mg Oral Daily  . folic acid-pyridoxine-cyancobalamin  1 tablet Oral Daily  . furosemide  40 mg Oral Daily  . iron polysaccharides  150 mg Oral Daily  . lisinopril  2.5 mg Oral BID  . mouth rinse  15 mL Mouth Rinse BID  . pantoprazole  40 mg Oral Daily  . potassium chloride  20 mEq Oral BID  . sodium chloride flush  10-40 mL Intracatheter Q12H  . sodium chloride flush  3 mL Intravenous Q12H  . sodium chloride flush  3 mL Intravenous Q12H  . spironolactone  12.5 mg Oral Daily  . warfarin  5 mg Oral q1800  . Warfarin - Physician Dosing Inpatient   Does not apply q1800   Continuous Infusions: . sodium chloride Stopped (11/12/16 1400)   PRN Meds:.sodium chloride, acetaminophen, chlorpheniramine-HYDROcodone, metoprolol, ondansetron (ZOFRAN) IV, oxyCODONE, sodium chloride flush, sodium chloride flush, sodium chloride flush, sorbitol, traMADol    Vitals:   11/18/16 1615 11/18/16 1931 11/19/16 0529 11/19/16 0534  BP: (!) 110/46 (!) 104/39  (!) 123/53  Pulse: 62 61  67  Resp:  19  18  Temp:  98.2 F (36.8 C)  98.2 F (36.8 C)  TempSrc:  Oral  Oral  SpO2: 92% 98%  95%  Weight:   158 lb 11.2 oz (72 kg)   Height:        Intake/Output Summary (Last 24 hours) at 11/19/16 0933 Last data filed at 11/18/16 1543  Gross per 24 hour  Intake                0 ml  Output             1000 ml  Net            -1000 ml    LABS: Basic Metabolic  Panel:  Recent Labs  11/18/16 0418 11/19/16 0402  NA 134* 134*  K 4.3 4.3  CL 96* 98*  CO2 31 27  GLUCOSE 95 101*  BUN 19 19  CREATININE 0.67 0.75  CALCIUM 8.6* 8.6*   Liver Function Tests: No results for input(s): AST, ALT, ALKPHOS, BILITOT, PROT, ALBUMIN in the last 72 hours. No results for input(s): LIPASE, AMYLASE in the last 72 hours. CBC:  Recent Labs  11/17/16 1138 11/18/16 0418  WBC 13.1* 11.8*  HGB 10.2* 10.0*  HCT 30.8* 30.7*  MCV 90.9 90.8  PLT 229 260   Cardiac Enzymes: No results for input(s): CKTOTAL, CKMB, CKMBINDEX, TROPONINI in the last 72 hours. BNP: Invalid input(s): POCBNP D-Dimer: No results for input(s): DDIMER in the last 72 hours. Hemoglobin A1C: No results for input(s): HGBA1C in the last 72 hours. Fasting Lipid Panel: No results for input(s): CHOL, HDL, LDLCALC, TRIG, CHOLHDL, LDLDIRECT in the last 72 hours. Thyroid Function Tests: No results for input(s): TSH, T4TOTAL, T3FREE, THYROIDAB in the last 72  hours.  Invalid input(s): FREET3 Anemia Panel: No results for input(s): VITAMINB12, FOLATE, FERRITIN, TIBC, IRON, RETICCTPCT in the last 72 hours.  RADIOLOGY: Dg Chest 2 View  Result Date: 11/19/2016 CLINICAL DATA:  Status post pacemaker insertion for cardiac arrhythmia EXAM: CHEST  2 VIEW COMPARISON:  November 17, 2016 FINDINGS: There is not pacemaker present on the left. Lead tips are attached the right atrium, right ventricle, and coronary sinus. Temporary pacemaker wires also or attached to the right heart. Central catheter tip is in the superior vena cava. No pneumothorax evident. There is overall slightly less interstitial edema. There is patchy airspace opacity in the right lower lobe, essentially stable. No new opacity. There is cardiomegaly with mild pulmonary venous hypertension. There is atherosclerotic calcification aorta. No adenopathy. 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 There is a left atrial appendage clamp. No  evident adenopathy. No bone lesions. IMPRESSION: Pacemaker leads as described. No pneumothorax. Less interstitial edema compared to recent study. Patchy airspace opacity in the right base is suspicious for superimposed pneumonia. Stable cardiac silhouette. Electronically Signed   By: Bretta Bang III M.D.   On: 11/19/2016 07:14   Dg Chest 2 View  Result Date: 11/17/2016 CLINICAL DATA:  Right-sided chest tube removal, followup EXAM: CHEST  2 VIEW COMPARISON:  Chest x-ray of 11/15/2016 FINDINGS: After right chest tube removal no definite right pneumothorax is seen. There is some linear atelectasis at the right lung base. Airspace disease has improved within the left mid and upper lung and at the right lung base in the interval. Cardiomegaly is stable and mitral valve replacement is noted with aortic valve replacement and left atrial appendage exclusion device noted as well. There appears to be a small left pleural effusion. Right PICC line tip overlies the lower SVC. IMPRESSION: 1. After removal of the right chest tube no definite right pneumothorax is seen. 2. Some improvement in airspace disease in the left mid and upper lung and the right lung base. 3. Stable cardiomegaly with probable small left pleural effusion. Electronically Signed   By: Dwyane Dee M.D.   On: 11/17/2016 16:18   Dg Chest 2 View  Addendum Date: 11/09/2016   ADDENDUM REPORT: 11/09/2016 14:56 ADDENDUM: Review of a CT scan performed on November 02, 2016, on Mr. Gillie but reported under a different medical record number, reveals no abnormality in the area of clinical concern. Thus the CT scan that I recommended to be done prior to the anticipated surgical procedure is not necessary. I apologize for the confusion that this has caused. Electronically Signed   By: David  [place] M.D.   On: 11/09/2016 14:56   Result Date: 11/09/2016 CLINICAL DATA:  Preoperative examination prior mitral valve surgery. Discontinued smoking 1 month ago.  Atrial fibrillation, CHF. EXAM: CHEST  2 VIEW COMPARISON:  None in PACs FINDINGS: The lungs are adequately inflated. There is subtle increased density in the retrosternal region inferiorly which may lie on the right. The left lung is clear. The cardiac silhouette is mildly enlarged. The pulmonary vascularity is normal. The mediastinum is normal in width. The bony thorax exhibits no acute abnormality. IMPRESSION: Mild right-sided infrahilar/retrosternal soft tissue prominence that that may reflect atelectasis or pneumonia or a soft tissue mass. Chest CT scanning is recommended prior the patient's surgical procedure. Mild cardiomegaly without pulmonary edema.  No pleural effusion. These results will be called to the ordering clinician or representative by the Radiologist Assistant, and communication documented in the PACS or zVision Dashboard. Electronically  Signed: By: David  [place] M.D. On: 11/09/2016 10:13   Dg Chest Port 1 View  Result Date: 11/15/2016 CLINICAL DATA:  61 year old male with a history of chest tube. Status post mitral valve replacement. Surgery 11/11/2016, with minimally invasive mitral valve replacement, tricuspid valve repair, Maze procedure and left atrial appendage amputation/clip. EXAM: PORTABLE CHEST 1 VIEW COMPARISON:  11/14/2016, 11/13/2016, 11/12/2016 FINDINGS: Cardiomediastinal silhouette is unchanged, with postoperative changes of atrial appendage clipping, mitral valve and tricuspid valve repair. Unchanged position of 2 left-sided pleural/mediastinal drains. Unchanged epicardial pacing leads. Slight worsening of left upper lung airspace opacity with mild air bronchograms. Improving right lower lobe interstitial and airspace opacity since the study dated 11/12/2016. No visualized pneumothorax.  No large pleural effusion. IMPRESSION: Since yesterday's chest x-ray there is slight worsening of airspace disease of the left upper lung. There is continued improvement of the right lower lobe  interstitial/airspace opacity from the study dated 11/12/2016. Re- demonstration of 2 right-sided pleural/mediastinal drains. Unchanged epicardial pacing leads. No pneumothorax. Surgical changes of atrial clipping, tricuspid and mitral valve repair. Signed, Yvone Neu. Loreta Ave, DO [facility] Electronically Signed   By: Gilmer Mor D.O.   On: 11/15/2016 07:33   Dg Chest Port 1 View  Result Date: 11/14/2016 CLINICAL DATA:  Status post mitral and tricuspid valve replacement and Maze procedure EXAM: PORTABLE CHEST 1 VIEW COMPARISON:  11/13/2016 FINDINGS: Postsurgical changes are again seen and stable. Right-sided PICC line is again identified and has been withdrawn to the cavoatrial junction. Right thoracostomy catheter and pericardial drain are again noted and stable. Diffuse increased density is noted throughout both lungs stable from the prior exam. No new focal infiltrate is noted. No pneumothorax is seen. IMPRESSION: Postsurgical changes. Tubes and lines as described. Patchy opacities throughout both lungs relatively stable from the prior exam likely representing a degree of edema. Continued follow-up is recommended. Electronically Signed   By: Alcide Clever M.D.   On: 11/14/2016 07:16   Dg Chest Port 1 View  Addendum Date: 11/13/2016   ADDENDUM REPORT: 11/13/2016 13:04 ADDENDUM: Tip of PICC is 4.5 cm distal to the superior cavoatrial junction. Electronically Signed   By: Trudie Reed M.D.   On: 11/13/2016 13:04   Result Date: 11/13/2016 CLINICAL DATA:  61 year old male status post central line placement. EXAM: PORTABLE CHEST 1 VIEW COMPARISON:  Chest x-ray 11/13/2016. FINDINGS: Left IJ Cordis with tip terminating in the left innominate vein. Right upper extremity PICC with tip terminating in the right atrium. Two right-sided chest tubes in position, what with tip near the apex of the right hemithorax and other with tip projecting slightly to the  left of midline (likely projectional). No appreciable right-sided pneumothorax. Lung volumes are low. Widespread interstitial and airspace disease is noted throughout the lungs bilaterally, asymmetrically distributed involving the right mid to lower lung, and relatively diffuse involvement of the left lung, with sparing of the right upper lobe. Pulmonary vasculature does not appear engorged. No definite pleural effusions. Heart size is mildly enlarged. Status post mechanical mitral valve replacements and tricuspid annuloplasty. Status post left atrial appendage ligation with left atrial clip in place. IMPRESSION: 1. Postoperative changes and support apparatus, as above. 2. Persistent patchy multifocal asymmetrically distributed interstitial and airspace disease throughout the lungs bilaterally. Findings could reflect asymmetric noncardiogenic edema, or could indicate infectious consolidation. Attention on followup studies is recommended. 3. Mild cardiomegaly. Electronically Signed: By: Trudie Reed M.D. On: 11/13/2016 12:47   Dg Chest Port 1  View  Result Date: 11/13/2016 CLINICAL DATA:  Atelectasis. Short of breath. Subsequent encounter. EXAM: PORTABLE CHEST 1 VIEW COMPARISON:  11/12/2016 and older exams FINDINGS: There is new patchy airspace opacity in the left mid to upper lung. Consolidation in the right mid to lower lung is stable. No pneumothorax. Two right-sided chest tubes are stable. Swan-Ganz catheter has been removed. The left internal jugular Cordis remains in place. There is no mediastinal widening. Changes from the recent surgery and cardiac valve replacement are stable. IMPRESSION: 1. Worsening lung aeration with new airspace opacity developing in the left mid to upper lung. This may reflect asymmetric edema. Pneumonia or aspiration pneumonitis is possible. 2. Status post removal of the Swan-Ganz catheter. Left internal jugular introducer sheath remains in place. 3. No other change. 4. Stable  right chest tubes. No pneumothorax. Persistent right mid to lower lung zone airspace opacity. Persistent medial left lung base atelectasis. Electronically Signed   By: Amie Portland M.D.   On: 11/13/2016 07:56   Dg Chest Port 1 View  Result Date: 11/12/2016 CLINICAL DATA:  Cardiac surgery, chest tube EXAM: PORTABLE CHEST 1 VIEW COMPARISON:  Chest radiograph from one day prior. FINDINGS: Right apical chest tube is in place. A stable separate tube terminates over the left lower mediastinum. Interval extubation. Left internal jugular Swan-Ganz catheter terminates over the right pulmonary artery. Cardiac valvular prostheses are in place. Stable cardiomediastinal silhouette with mild cardiomegaly. No pneumothorax. No pleural effusion. There is worsening patchy consolidation at the right greater than left lung bases. IMPRESSION: 1. Support structures as described.  No pneumothorax. 2. Stable mild cardiomegaly. Worsening patchy consolidation at the right greater than left lung bases, for which the differential includes worsening atelectasis, aspiration and/or pneumonia. Electronically Signed   By: Delbert Phenix M.D.   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 Nasogastric tube extends into stomach. LEFT jugular Swan-Ganz catheter tip projects over proximal descending interlobar pulmonary artery. Epicardial pacing wires present. Mediastinal drain and RIGHT thoracostomy tube present. Enlargement of cardiac silhouette post MVR and TVR. LEFT atrial appendage clip noted. Mild pulmonary vascular congestion. Probable RIGHT perihilar edema with small RIGHT  pleural effusion and mild mid lung atelectasis. No definite pneumothorax. Osseous structures unremarkable. IMPRESSION: Postoperative changes as above with mild atelectasis and asymmetric RIGHT lung edema. Electronically Signed   By: Ulyses Southward M.D.   On: 11/11/2016 16:25   Ct Angio Chest Aorta W &/or Wo Contrast  Result Date: 11/02/2016 CLINICAL DATA:  61 year old male with history of severe mitral regurgitation. Preoperative evaluation prior to potential repair or replacement. EXAM: CT ANGIOGRAPHY CHEST, ABDOMEN AND PELVIS TECHNIQUE: Multidetector CT imaging through the chest, abdomen and pelvis was performed using the standard protocol during bolus administration of intravenous contrast. Multiplanar reconstructed images and MIPs were obtained and reviewed to evaluate the vascular anatomy. CONTRAST:  75 mL of Isovue 370. COMPARISON:  No priors. FINDINGS: CTA CHEST FINDINGS Cardiovascular: Heart size is enlarged with moderate left ventricular and left atrial dilatation. There is no significant pericardial fluid, thickening or pericardial calcification. Mild atherosclerosis of the thoracic aorta without evidence of aneurysm or dissection. No coronary artery calcifications. Ascending, mid arch and descending thoracic  aorta measure 2.8 cm, 2.7 cm, and 2.6 cm in diameter respectively. No definite calcifications of the mitral annulus or mitral valve (noncontrast images were not obtained). No calcifications of the aortic valve. Mediastinum/Lymph Nodes: No pathologically enlarged mediastinal or hilar lymph nodes. Esophagus is unremarkable in appearance. No axillary lymphadenopathy. Lungs/Pleura: Small loculated pneumothorax in the apex of the left hemithorax adjacent to several small blebs. This occupies approximately 5% of the volume of the left hemithorax. Mild paraseptal emphysema. No acute consolidative airspace disease. No pleural effusions. Linear scarring in the inferior segment of the lingula. In the apex of  the right upper lobe there is a 8 x 6 mm (mean diameter of 7 mm) area of pleuroparenchymal nodular scarring. No other suspicious appearing pulmonary nodules or masses are noted. There are no aggressive appearing lytic or blastic lesions noted in the visualized portions of the skeleton. Musculoskeletal/Soft Tissues: There are no aggressive appearing lytic or blastic lesions noted in the visualized portions of the skeleton. CTA ABDOMEN AND PELVIS FINDINGS Hepatobiliary: No suspicious cystic or solid hepatic lesions. No intra or extrahepatic biliary ductal dilatation. Gallbladder is normal in appearance. Pancreas: No pancreatic mass. No pancreatic ductal dilatation. No pancreatic or peripancreatic fluid or inflammatory changes. Spleen: Unremarkable. Adrenals/Urinary Tract: Bilateral kidneys and bilateral adrenal glands are normal in appearance. No hydroureteronephrosis or perinephric stranding to indicate urinary tract obstruction at this time. Urinary bladder is normal in appearance. Stomach/Bowel: Normal appearance of the stomach. No pathologic dilatation of small bowel or colon. The appendix is not confidently identified and may be surgically absent. Regardless, there are no inflammatory changes noted adjacent to the cecum to suggest the presence of an acute appendicitis at this time. Vascular/Lymphatic: Aortic atherosclerosis, without evidence of aneurysm, dissection wall or hemodynamically significant stenosis in the abdominal or pelvic vasculature. No lymphadenopathy noted in the abdomen or pelvis. Reproductive: Prostate gland and seminal vesicles are normal in appearance. Other: Trace volume of ascites.  No pneumoperitoneum. Musculoskeletal: There are no aggressive appearing lytic or blastic lesions noted in the visualized portions of the skeleton. Review of the MIP images confirms the above findings. IMPRESSION: 1. Cardiomegaly with moderate left ventricular and left atrial dilatation, compatible with the  reported clinical history of mitral valve disease. No definite calcifications associated with the mitral annulus or the mitral valve on today's contrast enhanced examination. 2. Small left apical pneumothorax occupying less than 5% of the volume of the left hemithorax. This is likely loculated in the left apex, and given the appearance on the prior chest x-ray may be chronic, potentially related to prior rupture of a small subpleural bleb as the patient does have some very mild paraseptal emphysema. This is of questionable clinical significance, but clinical correlation is recommended. 3. 7 mm subpleural nodule in the apex of the right upper lobe favored to represent a small focus of pleuroparenchymal scarring. Non-contrast chest CT at 6-12 months is recommended. If the nodule is stable at time of repeat CT, then future CT at 18-24 months (from today's scan) is considered optional for low-risk patients, but is recommended for high-risk patients. This recommendation follows the consensus statement: Guidelines for Management of Incidental Pulmonary Nodules Detected on CT Images: From the Fleischner Society 2017; Radiology 2017; 284:228-243. 4. Aortic atherosclerosis (mild), without evidence of aneurysm or dissection. 5. Additional incidental findings, as above. Critical Value/emergent results were called by telephone at the time of interpretation on 11/02/2016 at 12:54 pm to Dr. Tressie Stalker, who verbally acknowledged these results. Electronically Signed  By: Trudie Reed M.D.   On: 11/02/2016 12:58   Ct Angio Abd/pel W/ And/or W/o  Result Date: 11/02/2016 CLINICAL DATA:  61 year old male with history of severe mitral regurgitation. Preoperative evaluation prior to potential repair or replacement. EXAM: CT ANGIOGRAPHY CHEST, ABDOMEN AND PELVIS TECHNIQUE: Multidetector CT imaging through the chest, abdomen and pelvis was performed using the standard protocol during bolus administration of intravenous  contrast. Multiplanar reconstructed images and MIPs were obtained and reviewed to evaluate the vascular anatomy. CONTRAST:  75 mL of Isovue 370. COMPARISON:  No priors. FINDINGS: CTA CHEST FINDINGS Cardiovascular: Heart size is enlarged with moderate left ventricular and left atrial dilatation. There is no significant pericardial fluid, thickening or pericardial calcification. Mild atherosclerosis of the thoracic aorta without evidence of aneurysm or dissection. No coronary artery calcifications. Ascending, mid arch and descending thoracic aorta measure 2.8 cm, 2.7 cm, and 2.6 cm in diameter respectively. No definite calcifications of the mitral annulus or mitral valve (noncontrast images were not obtained). No calcifications of the aortic valve. Mediastinum/Lymph Nodes: No pathologically enlarged mediastinal or hilar lymph nodes. Esophagus is unremarkable in appearance. No axillary lymphadenopathy. Lungs/Pleura: Small loculated pneumothorax in the apex of the left hemithorax adjacent to several small blebs. This occupies approximately 5% of the volume of the left hemithorax. Mild paraseptal emphysema. No acute consolidative airspace disease. No pleural effusions. Linear scarring in the inferior segment of the lingula. In the apex of the right upper lobe there is a 8 x 6 mm (mean diameter of 7 mm) area of pleuroparenchymal nodular scarring. No other suspicious appearing pulmonary nodules or masses are noted. There are no aggressive appearing lytic or blastic lesions noted in the visualized portions of the skeleton. Musculoskeletal/Soft Tissues: There are no aggressive appearing lytic or blastic lesions noted in the visualized portions of the skeleton. CTA ABDOMEN AND PELVIS FINDINGS Hepatobiliary: No suspicious cystic or solid hepatic lesions. No intra or extrahepatic biliary ductal dilatation. Gallbladder is normal in appearance. Pancreas: No pancreatic mass. No pancreatic ductal dilatation. No pancreatic or  peripancreatic fluid or inflammatory changes. Spleen: Unremarkable. Adrenals/Urinary Tract: Bilateral kidneys and bilateral adrenal glands are normal in appearance. No hydroureteronephrosis or perinephric stranding to indicate urinary tract obstruction at this time. Urinary bladder is normal in appearance. Stomach/Bowel: Normal appearance of the stomach. No pathologic dilatation of small bowel or colon. The appendix is not confidently identified and may be surgically absent. Regardless, there are no inflammatory changes noted adjacent to the cecum to suggest the presence of an acute appendicitis at this time. Vascular/Lymphatic: Aortic atherosclerosis, without evidence of aneurysm, dissection wall or hemodynamically significant stenosis in the abdominal or pelvic vasculature. No lymphadenopathy noted in the abdomen or pelvis. Reproductive: Prostate gland and seminal vesicles are normal in appearance. Other: Trace volume of ascites.  No pneumoperitoneum. Musculoskeletal: There are no aggressive appearing lytic or blastic lesions noted in the visualized portions of the skeleton. Review of the MIP images confirms the above findings. IMPRESSION: 1. Cardiomegaly with moderate left ventricular and left atrial dilatation, compatible with the reported clinical history of mitral valve disease. No definite calcifications associated with the mitral annulus or the mitral valve on today's contrast enhanced examination. 2. Small left apical pneumothorax occupying less than 5% of the volume of the left hemithorax. This is likely loculated in the left apex, and given the appearance on the prior chest x-ray may be chronic, potentially related to prior rupture of a small subpleural bleb as the patient does have some very mild  paraseptal emphysema. This is of questionable clinical significance, but clinical correlation is recommended. 3. 7 mm subpleural nodule in the apex of the right upper lobe favored to represent a small focus of  pleuroparenchymal scarring. Non-contrast chest CT at 6-12 months is recommended. If the nodule is stable at time of repeat CT, then future CT at 18-24 months (from today's scan) is considered optional for low-risk patients, but is recommended for high-risk patients. This recommendation follows the consensus statement: Guidelines for Management of Incidental Pulmonary Nodules Detected on CT Images: From the Fleischner Society 2017; Radiology 2017; 284:228-243. 4. Aortic atherosclerosis (mild), without evidence of aneurysm or dissection. 5. Additional incidental findings, as above. Critical Value/emergent results were called by telephone at the time of interpretation on 11/02/2016 at 12:54 pm to Dr. Tressie Stalker, who verbally acknowledged these results. Electronically Signed   By: Trudie Reed M.D.   On: 11/02/2016 12:58    PHYSICAL EXAM General: NAD Neck: JVP 7 cm, no thyromegaly or thyroid nodule.  Lungs: Crackles at bases bilaterally.  CV: Nondisplaced PMI.  Heart regular S1/S2, mechanical S2, no S3/S4, 1/6 SEM RUSB.  No peripheral edema.   Abdomen: Soft, nontender, no hepatosplenomegaly, no distention.  Neurologic: Alert and oriented x 3.  Psych: Normal affect. Extremities: No clubbing or cyanosis.   TELEMETRY: Reviewed telemetry, BiV paced without atrial activity noted.   ASSESSMENT AND PLAN: 61 yo with history of rheumatic heart disease and atrial fibrillation now s/p mechanical MV replacement, TV repair, Maze + LA appendage clipping.  He was in a junctional rhythm persistently post-op.  1. Rheumatic heart disease: Moderate to severe MR and moderate to severe TR, symptomatic.  Now s/p mechanical MV replacement and TV repair.   - He is on warfarin and ASA 81 with mechanical MV (goal INR 2.5-3.5).  2. Acute on chronic systolic CHF: TEE pre-op with EF 40-45% and LV-gram with EF 30-35%.  Post-op echo reviewed, EF stable around 40%.  Weight down, volume better on exam.   - Transition to po  Lasix.  - Continue lisinopril, increase spironolactone to 25 mg daily.  - Co-ox has been marginal, will not start Coreg yet.    3. Junctional rhythm: Now s/p Medtronic CRT-P.  4. Atrial fibrillation: Now s/p Maze and LA appendage clipping.  Patient appears to be in underlying atrial fibrillation now.  Will continue amiodarone, plan DCCV in a month or so.  Eventually would like to stop amiodarone.   Marca Ancona 11/19/2016 9:33 AM

## 2016-11-19 NOTE — Progress Notes (Addendum)
[address]       [name] [address]             [phone]      1 Day Post-Op Procedure(s) (LRB): BiV Pacemaker Insertion CRT-P (N/A) Subjective: Pacer placed yesterday, some episodes of  PSVT  Objective: Vital signs in last 24 hours: Temp:  [98.1 F (36.7 C)-98.2 F (36.8 C)] 98.2 F (36.8 C) (01/12 0534) Pulse Rate:  [47-67] 67 (01/12 0534) Cardiac Rhythm: Ventricular paced (01/11 1900) Resp:  [12-19] 18 (01/12 0534) BP: (104-181)/(39-94) 123/53 (01/12 0534) SpO2:  [89 %-100 %] 95 % (01/12 0534) Weight:  [158 lb 11.2 oz (72 kg)] 158 lb 11.2 oz (72 kg) (01/12 0529)  Hemodynamic parameters for last 24 hours:    Intake/Output from previous day: 01/11 0701 - 01/12 0700 In: -  Out: 1000 [Urine:1000] Intake/Output this shift: No intake/output data recorded.  General appearance: alert, cooperative and no distress Heart: regular rate and rhythm Lungs: some basilar crackles R>L Abdomen: benign Extremities: no edema Wound: incis healing well  Lab Results:  Recent Labs  11/17/16 1138 11/18/16 0418  WBC 13.1* 11.8*  HGB 10.2* 10.0*  HCT 30.8* 30.7*  PLT 229 260   BMET:  Recent Labs  11/18/16 0418 11/19/16 0402  NA 134* 134*  K 4.3 4.3  CL 96* 98*  CO2 31 27  GLUCOSE 95 101*  BUN 19 19  CREATININE 0.67 0.75  CALCIUM 8.6* 8.6*    PT/INR:  Recent Labs  11/19/16 0402  LABPROT 27.0*  INR 2.44   ABG    Component Value Date/Time   PHART 7.332 (L) 11/12/2016 0506   HCO3 21.4 11/12/2016 0506   TCO2 25 11/12/2016 1602   ACIDBASEDEF 5.0 (H) 11/12/2016 0506   O2SAT 52.8 11/18/2016 0429   CBG (last 3)  No results for input(s): GLUCAP in the last 72 hours.  Meds Scheduled Meds: . amiodarone  200 mg Oral BID  . aspirin EC  81 mg Oral Daily  . bisacodyl  10 mg Oral Daily   Or  . bisacodyl  10 mg Rectal Daily  . diclofenac sodium  2 g Topical QID  . docusate sodium  200 mg Oral Daily  . folic acid-pyridoxine-cyancobalamin   1 tablet Oral Daily  . furosemide  40 mg Intravenous BID  . iron polysaccharides  150 mg Oral Daily  . lisinopril  2.5 mg Oral BID  . mouth rinse  15 mL Mouth Rinse BID  . pantoprazole  40 mg Oral Daily  . potassium chloride  20 mEq Oral BID  . sodium chloride flush  10-40 mL Intracatheter Q12H  . sodium chloride flush  3 mL Intravenous Q12H  . sodium chloride flush  3 mL Intravenous Q12H  . spironolactone  12.5 mg Oral Daily  . warfarin  5 mg Oral q1800  . Warfarin - Physician Dosing Inpatient   Does not apply q1800   Continuous Infusions: . sodium chloride Stopped (11/12/16 1400)   PRN Meds:.sodium chloride, acetaminophen, chlorpheniramine-HYDROcodone, metoprolol, ondansetron (ZOFRAN) IV, oxyCODONE, sodium chloride flush, sodium chloride flush, sodium chloride flush, sorbitol, traMADol  Xrays Dg Chest 2 View  Result Date: 11/19/2016 CLINICAL DATA:  Status post pacemaker insertion for cardiac arrhythmia EXAM: CHEST  2 VIEW COMPARISON:  November 17, 2016 FINDINGS: There is not pacemaker present on the left. Lead tips are attached the right atrium, right ventricle, and coronary sinus. Temporary pacemaker wires also or attached to the right heart.  Central catheter tip is in the superior vena cava. No pneumothorax evident. There is overall slightly less interstitial edema. There is patchy airspace opacity in the right lower lobe, essentially stable. No new opacity. There is cardiomegaly with mild pulmonary venous hypertension. There is atherosclerotic calcification aorta. No adenopathy. 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 There is a left atrial appendage clamp. No evident adenopathy. No bone lesions. IMPRESSION: Pacemaker leads as described. No pneumothorax. Less interstitial edema compared to recent study. Patchy airspace opacity in the right base is suspicious for superimposed pneumonia. Stable cardiac silhouette. Electronically Signed   By: Bretta Bang III M.D.   On: 11/19/2016  07:14   Dg Chest 2 View  Result Date: 11/17/2016 CLINICAL DATA:  Right-sided chest tube removal, followup EXAM: CHEST  2 VIEW COMPARISON:  Chest x-ray of 11/15/2016 FINDINGS: After right chest tube removal no definite right pneumothorax is seen. There is some linear atelectasis at the right lung base. Airspace disease has improved within the left mid and upper lung and at the right lung base in the interval. Cardiomegaly is stable and mitral valve replacement is noted with aortic valve replacement and left atrial appendage exclusion device noted as well. There appears to be a small left pleural effusion. Right PICC line tip overlies the lower SVC. IMPRESSION: 1. After removal of the right chest tube no definite right pneumothorax is seen. 2. Some improvement in airspace disease in the left mid and upper lung and the right lung base. 3. Stable cardiomegaly with probable small left pleural effusion. Electronically Signed   By: Dwyane Dee M.D.   On: 11/17/2016 16:18    Assessment/Plan: S/P Procedure(s) (LRB): BiV Pacemaker Insertion CRT-P (N/A)   1 doing well 2 pacer in place, amiodarone has been started, some PSVT 3 will need epw's removed, INR 2.4- poss hold coumadin for removal 4 routine pulm toilet 5 BP well control is variable, lasix changing to po today- AHF team managing heart failure issues 6 renal fxn is normal   LOS: 8 days    GOLD,WAYNE E 11/19/2016   I have seen and examined the patient and agree with the assessment and plan as outlined.  Looks quite good.  Start oral amiodarone.  D/C temporary pacing wires.  Will ask EPS to increase baseline rate on pacer to 70 bpm given marginal LV function and mixed venous co-ox.  Possible d/c home 1-2 days   Purcell Nails, MD 11/19/2016 8:56 AM

## 2016-11-19 NOTE — Progress Notes (Signed)
Patient Name: [name] [name] Date of Encounter: 11/19/2016     Principal Problem:   S/P minimally invasive mitral valve replacement with mechanical prosthetic valve + tricuspid valve repair + maze procedure Active Problems:   Rheumatic mitral regurgitation   Non-ischemic cardiomyopathy (HCC)   LAE (left atrial enlargement)   Acute on chronic systolic CHF (congestive heart failure) (HCC)   Atrial fibrillation, persistent (HCC)   Severe mitral regurgitation   S/P minimally invasive tricuspid valve repair   S/P minimally invasive maze operation for atrial fibrillation   Tricuspid regurgitation   Junctional rhythm    SUBJECTIVE  No chest pain or sob. Minimal incisional pain.  CURRENT MEDS . amiodarone  200 mg Oral BID  . aspirin EC  81 mg Oral Daily  . bisacodyl  10 mg Oral Daily   Or  . bisacodyl  10 mg Rectal Daily  . diclofenac sodium  2 g Topical QID  . docusate sodium  200 mg Oral Daily  . folic acid-pyridoxine-cyancobalamin  1 tablet Oral Daily  . furosemide  40 mg Intravenous BID  . iron polysaccharides  150 mg Oral Daily  . lisinopril  2.5 mg Oral BID  . mouth rinse  15 mL Mouth Rinse BID  . pantoprazole  40 mg Oral Daily  . potassium chloride  20 mEq Oral BID  . sodium chloride flush  10-40 mL Intracatheter Q12H  . sodium chloride flush  3 mL Intravenous Q12H  . sodium chloride flush  3 mL Intravenous Q12H  . spironolactone  12.5 mg Oral Daily  . warfarin  5 mg Oral q1800  . Warfarin - Physician Dosing Inpatient   Does not apply q1800    OBJECTIVE  Vitals:   11/18/16 1615 11/18/16 1931 11/19/16 0529 11/19/16 0534  BP: (!) 110/46 (!) 104/39  (!) 123/53  Pulse: 62 61  67  Resp:  19  18  Temp:  98.2 F (36.8 C)  98.2 F (36.8 C)  TempSrc:  Oral  Oral  SpO2: 92% 98%  95%  Weight:   158 lb 11.2 oz (72 kg)   Height:        Intake/Output Summary (Last 24 hours) at 11/19/16 0827 Last data filed at 11/18/16 1543  Gross per 24 hour  Intake                 0 ml  Output             1000 ml  Net            -1000 ml   Filed Weights   11/17/16 0442 11/18/16 0446 11/19/16 0529  Weight: 166 lb 8 oz (75.5 kg) 166 lb 3.2 oz (75.4 kg) 158 lb 11.2 oz (72 kg)    PHYSICAL EXAM  General: Pleasant, middle aged man, NAD. Neuro: Alert and oriented X 3. Moves all extremities spontaneously. Psych: Normal affect. HEENT:  Normal  Neck: Supple without bruits or JVD. Lungs:  Resp regular and unlabored, CTA. Chest: incisions clean and dry. Heart: RRR no s3, s4, or murmurs. Abdomen: Soft, non-tender, non-distended, BS + x 4.  Extremities: No clubbing, cyanosis or edema. DP/PT/Radials 2+ and equal bilaterally.  Accessory Clinical Findings  CBC  Recent Labs  11/17/16 1138 11/18/16 0418  WBC 13.1* 11.8*  HGB 10.2* 10.0*  HCT 30.8* 30.7*  MCV 90.9 90.8  PLT 229 260   Basic Metabolic Panel  Recent Labs  11/18/16 0418 11/19/16 0402  NA 134* 134*  K 4.3 4.3  CL 96* 98*  CO2 31 27  GLUCOSE 95 101*  BUN 19 19  CREATININE 0.67 0.75  CALCIUM 8.6* 8.6*   Liver Function Tests No results for input(s): AST, ALT, ALKPHOS, BILITOT, PROT, ALBUMIN in the last 72 hours. No results for input(s): LIPASE, AMYLASE in the last 72 hours. Cardiac Enzymes No results for input(s): CKTOTAL, CKMB, CKMBINDEX, TROPONINI in the last 72 hours. BNP Invalid input(s): POCBNP D-Dimer No results for input(s): DDIMER in the last 72 hours. Hemoglobin A1C No results for input(s): HGBA1C in the last 72 hours. Fasting Lipid Panel No results for input(s): CHOL, HDL, LDLCALC, TRIG, CHOLHDL, LDLDIRECT in the last 72 hours. Thyroid Function Tests No results for input(s): TSH, T4TOTAL, T3FREE, THYROIDAB in the last 72 hours.  Invalid input(s): FREET3  TELE  Atrial fib with biv pacing  Radiology/Studies  Dg Chest 2 View  Result Date: 11/19/2016 CLINICAL DATA:  Status post pacemaker insertion for cardiac arrhythmia EXAM: CHEST  2 VIEW COMPARISON:  November 17, 2016 FINDINGS: There is not pacemaker present on the left. Lead tips are attached the right atrium, right ventricle, and coronary sinus. Temporary pacemaker wires also or attached to the right heart. Central catheter tip is in the superior vena cava. No pneumothorax evident. There is overall slightly less interstitial edema. There is patchy airspace opacity in the right lower lobe, essentially stable. No new opacity. There is cardiomegaly with mild pulmonary venous hypertension. There is atherosclerotic calcification aorta. No adenopathy. 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 There is a left atrial appendage clamp. No evident adenopathy. No bone lesions. IMPRESSION: Pacemaker leads as described. No pneumothorax. Less interstitial edema compared to recent study. Patchy airspace opacity in the right base is suspicious for superimposed pneumonia. Stable cardiac silhouette. Electronically Signed   By: Bretta Bang III M.D.   On: 11/19/2016 07:14   Dg Chest 2 View  Result Date: 11/17/2016 CLINICAL DATA:  Right-sided chest tube removal, followup EXAM: CHEST  2 VIEW COMPARISON:  Chest x-ray of 11/15/2016 FINDINGS: After right chest tube removal no definite right pneumothorax is seen. There is some linear atelectasis at the right lung base. Airspace disease has improved within the left mid and upper lung and at the right lung base in the interval. Cardiomegaly is stable and mitral valve replacement is noted with aortic valve replacement and left atrial appendage exclusion device noted as well. There appears to be a small left pleural effusion. Right PICC line tip overlies the lower SVC. IMPRESSION: 1. After removal of the right chest tube no definite right pneumothorax is seen. 2. Some improvement in airspace disease in the left mid and upper lung and the right lung base. 3. Stable cardiomegaly with probable small left pleural effusion. Electronically Signed   By: Dwyane Dee M.D.   On: 11/17/2016 16:18     Dg Chest 2 View  Addendum Date: 11/09/2016   ADDENDUM REPORT: 11/09/2016 14:56 ADDENDUM: Review of a CT scan performed on November 02, 2016, on Mr. Doverspike but reported under a different medical record number, reveals no abnormality in the area of clinical concern. Thus the CT scan that I recommended to be done prior to the anticipated surgical procedure is not necessary. I apologize for the confusion that this has caused. Electronically Signed   By: David  [place] M.D.   On: 11/09/2016 14:56   Result Date: 11/09/2016 CLINICAL DATA:  Preoperative examination prior mitral valve surgery. Discontinued smoking 1 month ago. Atrial  fibrillation, CHF. EXAM: CHEST  2 VIEW COMPARISON:  None in PACs FINDINGS: The lungs are adequately inflated. There is subtle increased density in the retrosternal region inferiorly which may lie on the right. The left lung is clear. The cardiac silhouette is mildly enlarged. The pulmonary vascularity is normal. The mediastinum is normal in width. The bony thorax exhibits no acute abnormality. IMPRESSION: Mild right-sided infrahilar/retrosternal soft tissue prominence that that may reflect atelectasis or pneumonia or a soft tissue mass. Chest CT scanning is recommended prior the patient's surgical procedure. Mild cardiomegaly without pulmonary edema.  No pleural effusion. These results will be called to the ordering clinician or representative by the Radiologist Assistant, and communication documented in the PACS or zVision Dashboard. Electronically Signed: By: David  [place] M.D. On: 11/09/2016 10:13   Dg Chest Port 1 View  Result Date: 11/15/2016 CLINICAL DATA:  61 year old male with a history of chest tube. Status post mitral valve replacement. Surgery 11/11/2016, with minimally invasive mitral valve replacement, tricuspid valve repair, Maze procedure and left atrial appendage amputation/clip. EXAM: PORTABLE CHEST 1 VIEW COMPARISON:  11/14/2016, 11/13/2016, 11/12/2016 FINDINGS:  Cardiomediastinal silhouette is unchanged, with postoperative changes of atrial appendage clipping, mitral valve and tricuspid valve repair. Unchanged position of 2 left-sided pleural/mediastinal drains. Unchanged epicardial pacing leads. Slight worsening of left upper lung airspace opacity with mild air bronchograms. Improving right lower lobe interstitial and airspace opacity since the study dated 11/12/2016. No visualized pneumothorax.  No large pleural effusion. IMPRESSION: Since yesterday's chest x-ray there is slight worsening of airspace disease of the left upper lung. There is continued improvement of the right lower lobe interstitial/airspace opacity from the study dated 11/12/2016. Re- demonstration of 2 right-sided pleural/mediastinal drains. Unchanged epicardial pacing leads. No pneumothorax. Surgical changes of atrial clipping, tricuspid and mitral valve repair. Signed, Yvone Neu. Loreta Ave, DO [facility] Radiology Electronically Signed   By: Gilmer Mor D.O.   On: 11/15/2016 07:33   Dg Chest Port 1 View  Result Date: 11/14/2016 CLINICAL DATA:  Status post mitral and tricuspid valve replacement and Maze procedure EXAM: PORTABLE CHEST 1 VIEW COMPARISON:  11/13/2016 FINDINGS: Postsurgical changes are again seen and stable. Right-sided PICC line is again identified and has been withdrawn to the cavoatrial junction. Right thoracostomy catheter and pericardial drain are again noted and stable. Diffuse increased density is noted throughout both lungs stable from the prior exam. No new focal infiltrate is noted. No pneumothorax is seen. IMPRESSION: Postsurgical changes. Tubes and lines as described. Patchy opacities throughout both lungs relatively stable from the prior exam likely representing a degree of edema. Continued follow-up is recommended. Electronically Signed   By: Alcide Clever M.D.   On: 11/14/2016 07:16   Dg Chest Port 1 View  Addendum Date:  11/13/2016   ADDENDUM REPORT: 11/13/2016 13:04 ADDENDUM: Tip of PICC is 4.5 cm distal to the superior cavoatrial junction. Electronically Signed   By: Trudie Reed M.D.   On: 11/13/2016 13:04   Result Date: 11/13/2016 CLINICAL DATA:  61 year old male status post central line placement. EXAM: PORTABLE CHEST 1 VIEW COMPARISON:  Chest x-ray 11/13/2016. FINDINGS: Left IJ Cordis with tip terminating in the left innominate vein. Right upper extremity PICC with tip terminating in the right atrium. Two right-sided chest tubes in position, what with tip near the apex of the right hemithorax and other with tip projecting slightly to the left of midline (likely projectional). No appreciable right-sided pneumothorax. Lung volumes are low. Widespread interstitial and airspace  disease is noted throughout the lungs bilaterally, asymmetrically distributed involving the right mid to lower lung, and relatively diffuse involvement of the left lung, with sparing of the right upper lobe. Pulmonary vasculature does not appear engorged. No definite pleural effusions. Heart size is mildly enlarged. Status post mechanical mitral valve replacements and tricuspid annuloplasty. Status post left atrial appendage ligation with left atrial clip in place. IMPRESSION: 1. Postoperative changes and support apparatus, as above. 2. Persistent patchy multifocal asymmetrically distributed interstitial and airspace disease throughout the lungs bilaterally. Findings could reflect asymmetric noncardiogenic edema, or could indicate infectious consolidation. Attention on followup studies is recommended. 3. Mild cardiomegaly. Electronically Signed: By: Trudie Reed M.D. On: 11/13/2016 12:47   Dg Chest Port 1 View  Result Date: 11/13/2016 CLINICAL DATA:  Atelectasis. Short of breath. Subsequent encounter. EXAM: PORTABLE CHEST 1 VIEW COMPARISON:  11/12/2016 and older exams FINDINGS: There is new patchy airspace opacity in the left mid to upper lung.  Consolidation in the right mid to lower lung is stable. No pneumothorax. Two right-sided chest tubes are stable. Swan-Ganz catheter has been removed. The left internal jugular Cordis remains in place. There is no mediastinal widening. Changes from the recent surgery and cardiac valve replacement are stable. IMPRESSION: 1. Worsening lung aeration with new airspace opacity developing in the left mid to upper lung. This may reflect asymmetric edema. Pneumonia or aspiration pneumonitis is possible. 2. Status post removal of the Swan-Ganz catheter. Left internal jugular introducer sheath remains in place. 3. No other change. 4. Stable right chest tubes. No pneumothorax. Persistent right mid to lower lung zone airspace opacity. Persistent medial left lung base atelectasis. Electronically Signed   By: Amie Portland M.D.   On: 11/13/2016 07:56   Dg Chest Port 1 View  Result Date: 11/12/2016 CLINICAL DATA:  Cardiac surgery, chest tube EXAM: PORTABLE CHEST 1 VIEW COMPARISON:  Chest radiograph from one day prior. FINDINGS: Right apical chest tube is in place. A stable separate tube terminates over the left lower mediastinum. Interval extubation. Left internal jugular Swan-Ganz catheter terminates over the right pulmonary artery. Cardiac valvular prostheses are in place. Stable cardiomediastinal silhouette with mild cardiomegaly. No pneumothorax. No pleural effusion. There is worsening patchy consolidation at the right greater than left lung bases. IMPRESSION: 1. Support structures as described.  No pneumothorax. 2. Stable mild cardiomegaly. Worsening patchy consolidation at the right greater than left lung bases, for which the differential includes worsening atelectasis, aspiration and/or pneumonia. Electronically Signed   By: Delbert Phenix M.D.   On: 11/12/2016 07:37   Dg Chest Port 1 View  Result Date: 11/11/2016 CLINICAL DATA:  Atelectasis, atrial fibrillation, dyspnea, exertional angina, heart murmur, non ischemic  cardiomyopathy, palpitations, 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 Nasogastric tube extends into stomach. LEFT jugular Swan-Ganz catheter tip projects over proximal descending interlobar pulmonary artery. Epicardial pacing wires present. Mediastinal drain and RIGHT thoracostomy tube present. Enlargement of cardiac silhouette post MVR and TVR. LEFT atrial appendage clip noted. Mild pulmonary vascular congestion. Probable RIGHT perihilar edema with small RIGHT pleural effusion and mild mid lung atelectasis. No definite pneumothorax. Osseous structures unremarkable. IMPRESSION: Postoperative changes as above with mild atelectasis and asymmetric RIGHT lung edema. Electronically Signed   By: Ulyses Southward M.D.   On: 11/11/2016 16:25   Ct Angio Chest Aorta W &/or Wo Contrast  Result Date: 11/02/2016 CLINICAL DATA:  61 year old male with history of severe mitral regurgitation. Preoperative evaluation prior to potential repair or replacement. EXAM: CT ANGIOGRAPHY CHEST, ABDOMEN AND PELVIS TECHNIQUE: Multidetector CT imaging through the chest, abdomen and pelvis was performed using the standard protocol during bolus administration of intravenous contrast. Multiplanar reconstructed images and MIPs were obtained and reviewed to evaluate the vascular anatomy. CONTRAST:  75 mL of Isovue 370. COMPARISON:  No priors. FINDINGS: CTA CHEST FINDINGS Cardiovascular: Heart size is enlarged with moderate left ventricular and left atrial dilatation. There is no significant pericardial fluid, thickening or pericardial calcification. Mild atherosclerosis of the thoracic aorta without evidence of aneurysm or dissection. No coronary artery calcifications. Ascending, mid arch and descending thoracic aorta measure 2.8 cm, 2.7 cm, and 2.6 cm in diameter respectively.  No definite calcifications of the mitral annulus or mitral valve (noncontrast images were not obtained). No calcifications of the aortic valve. Mediastinum/Lymph Nodes: No pathologically enlarged mediastinal or hilar lymph nodes. Esophagus is unremarkable in appearance. No axillary lymphadenopathy. Lungs/Pleura: Small loculated pneumothorax in the apex of the left hemithorax adjacent to several small blebs. This occupies approximately 5% of the volume of the left hemithorax. Mild paraseptal emphysema. No acute consolidative airspace disease. No pleural effusions. Linear scarring in the inferior segment of the lingula. In the apex of the right upper lobe there is a 8 x 6 mm (mean diameter of 7 mm) area of pleuroparenchymal nodular scarring. No other suspicious appearing pulmonary nodules or masses are noted. There are no aggressive appearing lytic or blastic lesions noted in the visualized portions of the skeleton. Musculoskeletal/Soft Tissues: There are no aggressive appearing lytic or blastic lesions noted in the visualized portions of the skeleton. CTA ABDOMEN AND PELVIS FINDINGS Hepatobiliary: No suspicious cystic or solid hepatic lesions. No intra or extrahepatic biliary ductal dilatation. Gallbladder is normal in appearance. Pancreas: No pancreatic mass. No pancreatic ductal dilatation. No pancreatic or peripancreatic fluid or inflammatory changes. Spleen: Unremarkable. Adrenals/Urinary Tract: Bilateral kidneys and bilateral adrenal glands are normal in appearance. No hydroureteronephrosis or perinephric stranding to indicate urinary tract obstruction at this time. Urinary bladder is normal in appearance. Stomach/Bowel: Normal appearance of the stomach. No pathologic dilatation of small bowel or colon. The appendix is not confidently identified and may be surgically absent. Regardless, there are no inflammatory changes noted adjacent to the cecum to suggest the presence of an acute appendicitis at this time.  Vascular/Lymphatic: Aortic atherosclerosis, without evidence of aneurysm, dissection wall or hemodynamically significant stenosis in the abdominal or pelvic vasculature. No lymphadenopathy noted in the abdomen or pelvis. Reproductive: Prostate gland and seminal vesicles are normal in appearance. Other: Trace volume of ascites.  No pneumoperitoneum. Musculoskeletal: There are no aggressive appearing lytic or blastic lesions noted in the visualized portions of the skeleton. Review of the MIP images confirms the above findings. IMPRESSION: 1. Cardiomegaly with moderate left ventricular and left atrial dilatation, compatible with the reported clinical history of mitral valve disease. No definite calcifications associated with the mitral annulus or the mitral valve on today's contrast enhanced examination. 2. Small left apical pneumothorax occupying less than 5% of the volume of the left hemithorax. This is likely loculated in the left apex, and given the appearance on the prior chest x-ray may be chronic, potentially related to prior rupture of a small subpleural bleb as the patient does have some very mild paraseptal emphysema. This is of questionable clinical significance, but clinical correlation is recommended. 3. 7 mm subpleural nodule in  the apex of the right upper lobe favored to represent a small focus of pleuroparenchymal scarring. Non-contrast chest CT at 6-12 months is recommended. If the nodule is stable at time of repeat CT, then future CT at 18-24 months (from today's scan) is considered optional for low-risk patients, but is recommended for high-risk patients. This recommendation follows the consensus statement: Guidelines for Management of Incidental Pulmonary Nodules Detected on CT Images: From the Fleischner Society 2017; Radiology 2017; 284:228-243. 4. Aortic atherosclerosis (mild), without evidence of aneurysm or dissection. 5. Additional incidental findings, as above. Critical Value/emergent results  were called by telephone at the time of interpretation on 11/02/2016 at 12:54 pm to Dr. Tressie Stalker, who verbally acknowledged these results. Electronically Signed   By: Trudie Reed M.D.   On: 11/02/2016 12:58   Ct Angio Abd/pel W/ And/or W/o  Result Date: 11/02/2016 CLINICAL DATA:  61 year old male with history of severe mitral regurgitation. Preoperative evaluation prior to potential repair or replacement. EXAM: CT ANGIOGRAPHY CHEST, ABDOMEN AND PELVIS TECHNIQUE: Multidetector CT imaging through the chest, abdomen and pelvis was performed using the standard protocol during bolus administration of intravenous contrast. Multiplanar reconstructed images and MIPs were obtained and reviewed to evaluate the vascular anatomy. CONTRAST:  75 mL of Isovue 370. COMPARISON:  No priors. FINDINGS: CTA CHEST FINDINGS Cardiovascular: Heart size is enlarged with moderate left ventricular and left atrial dilatation. There is no significant pericardial fluid, thickening or pericardial calcification. Mild atherosclerosis of the thoracic aorta without evidence of aneurysm or dissection. No coronary artery calcifications. Ascending, mid arch and descending thoracic aorta measure 2.8 cm, 2.7 cm, and 2.6 cm in diameter respectively. No definite calcifications of the mitral annulus or mitral valve (noncontrast images were not obtained). No calcifications of the aortic valve. Mediastinum/Lymph Nodes: No pathologically enlarged mediastinal or hilar lymph nodes. Esophagus is unremarkable in appearance. No axillary lymphadenopathy. Lungs/Pleura: Small loculated pneumothorax in the apex of the left hemithorax adjacent to several small blebs. This occupies approximately 5% of the volume of the left hemithorax. Mild paraseptal emphysema. No acute consolidative airspace disease. No pleural effusions. Linear scarring in the inferior segment of the lingula. In the apex of the right upper lobe there is a 8 x 6 mm (mean diameter of 7 mm)  area of pleuroparenchymal nodular scarring. No other suspicious appearing pulmonary nodules or masses are noted. There are no aggressive appearing lytic or blastic lesions noted in the visualized portions of the skeleton. Musculoskeletal/Soft Tissues: There are no aggressive appearing lytic or blastic lesions noted in the visualized portions of the skeleton. CTA ABDOMEN AND PELVIS FINDINGS Hepatobiliary: No suspicious cystic or solid hepatic lesions. No intra or extrahepatic biliary ductal dilatation. Gallbladder is normal in appearance. Pancreas: No pancreatic mass. No pancreatic ductal dilatation. No pancreatic or peripancreatic fluid or inflammatory changes. Spleen: Unremarkable. Adrenals/Urinary Tract: Bilateral kidneys and bilateral adrenal glands are normal in appearance. No hydroureteronephrosis or perinephric stranding to indicate urinary tract obstruction at this time. Urinary bladder is normal in appearance. Stomach/Bowel: Normal appearance of the stomach. No pathologic dilatation of small bowel or colon. The appendix is not confidently identified and may be surgically absent. Regardless, there are no inflammatory changes noted adjacent to the cecum to suggest the presence of an acute appendicitis at this time. Vascular/Lymphatic: Aortic atherosclerosis, without evidence of aneurysm, dissection wall or hemodynamically significant stenosis in the abdominal or pelvic vasculature. No lymphadenopathy noted in the abdomen or pelvis. Reproductive: Prostate gland and seminal vesicles are normal in appearance. Other:  Trace volume of ascites.  No pneumoperitoneum. Musculoskeletal: There are no aggressive appearing lytic or blastic lesions noted in the visualized portions of the skeleton. Review of the MIP images confirms the above findings. IMPRESSION: 1. Cardiomegaly with moderate left ventricular and left atrial dilatation, compatible with the reported clinical history of mitral valve disease. No definite  calcifications associated with the mitral annulus or the mitral valve on today's contrast enhanced examination. 2. Small left apical pneumothorax occupying less than 5% of the volume of the left hemithorax. This is likely loculated in the left apex, and given the appearance on the prior chest x-ray may be chronic, potentially related to prior rupture of a small subpleural bleb as the patient does have some very mild paraseptal emphysema. This is of questionable clinical significance, but clinical correlation is recommended. 3. 7 mm subpleural nodule in the apex of the right upper lobe favored to represent a small focus of pleuroparenchymal scarring. Non-contrast chest CT at 6-12 months is recommended. If the nodule is stable at time of repeat CT, then future CT at 18-24 months (from today's scan) is considered optional for low-risk patients, but is recommended for high-risk patients. This recommendation follows the consensus statement: Guidelines for Management of Incidental Pulmonary Nodules Detected on CT Images: From the Fleischner Society 2017; Radiology 2017; 284:228-243. 4. Aortic atherosclerosis (mild), without evidence of aneurysm or dissection. 5. Additional incidental findings, as above. Critical Value/emergent results were called by telephone at the time of interpretation on 11/02/2016 at 12:54 pm to Dr. Tressie Stalker, who verbally acknowledged these results. Electronically Signed   By: Trudie Reed M.D.   On: 11/02/2016 12:58    ASSESSMENT AND PLAN  1. Atrial fib with a slow VR - amio started 2. S/p MVR/TV repair 3. 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 Chronic systolic heart failure 5.  S/p BiV PPM - his device is working normally and CXR looks good. Will turn up pacing rate to 70/min.  6. Disp. - ok for DC today or tomorrow. We will plan to DCCV in 6-8 weeks if he has not reverted to NSR on his own. Would hope for only 12 weeks of amiodarone.   Carrington Mullenax,M.D.  Sharlot Gowda Vern Prestia,M.D.  11/19/2016 8:27  AMPatient ID: Rene Kocher, male   DOB: [birth year], 61 y.o.   MRN: [id number]

## 2016-11-20 LAB — BASIC METABOLIC PANEL
ANION GAP: 6 (ref 5–15)
BUN: 17 mg/dL (ref 6–20)
CO2: 27 mmol/L (ref 22–32)
Calcium: 8.7 mg/dL — ABNORMAL LOW (ref 8.9–10.3)
Chloride: 101 mmol/L (ref 101–111)
Creatinine, Ser: 0.76 mg/dL (ref 0.61–1.24)
GFR calc Af Amer: >60 mL/min (ref >60)
GLUCOSE: 105 mg/dL — AB (ref 65–99)
POTASSIUM: 4.5 mmol/L (ref 3.5–5.1)
Sodium: 134 mmol/L — ABNORMAL LOW (ref 135–145)

## 2016-11-20 LAB — PROTIME-INR
INR: 2.49
Prothrombin Time: 27.4 seconds — ABNORMAL HIGH (ref 11.4–15.2)

## 2016-11-20 MED ORDER — ASPIRIN 81 MG PO TBEC
81.0000 mg | DELAYED_RELEASE_TABLET | Freq: Every day | ORAL | Status: AC
Start: 2016-11-20 — End: ?

## 2016-11-20 MED ORDER — FUROSEMIDE 40 MG PO TABS: 40.0000 mg | ORAL_TABLET | Freq: Every day | ORAL | 3 refills | Status: DC

## 2016-11-20 MED ORDER — AMIODARONE HCL 200 MG PO TABS: 200.0000 mg | ORAL_TABLET | Freq: Two times a day (BID) | ORAL | 3 refills | Status: DC

## 2016-11-20 MED ORDER — LISINOPRIL 2.5 MG PO TABS: 2.5000 mg | ORAL_TABLET | Freq: Two times a day (BID) | ORAL | 3 refills | Status: DC

## 2016-11-20 MED ORDER — SPIRONOLACTONE 25 MG PO TABS: 25.0000 mg | ORAL_TABLET | Freq: Every day | ORAL | 3 refills | Status: DC

## 2016-11-20 MED ORDER — COUMADIN BOOK
Freq: Once | Status: AC
  Administered 2016-11-20: 11:00:00
  Filled 2016-11-20: qty 1

## 2016-11-20 MED ORDER — WARFARIN SODIUM 5 MG PO TABS: 5.0000 mg | ORAL_TABLET | Freq: Every day | ORAL | 3 refills | Status: DC

## 2016-11-20 MED ORDER — TRAMADOL HCL 50 MG PO TABS: 50.0000 mg | ORAL_TABLET | ORAL | 0 refills | Status: DC | PRN

## 2016-11-20 NOTE — Progress Notes (Signed)
PICC d/c'ed per order.  Pt and wife verbalize understanding of PICC site care, signs of infection and interventions for bleeding, and when to call dr.  No s&sx of infection noted, site cleaned with CHG, covered with vaseline guaze and dry 2x2.

## 2016-11-20 NOTE — Progress Notes (Signed)
Pt/family given discharge instructions, medication lists, follow up appointments, and when to call the doctor.  Pt/family verbalizes understanding. All questions answered. Brelynn Wheller McClintock, RN   

## 2016-11-20 NOTE — Progress Notes (Signed)
      [address]       [name] [address]             [phone]      2 Days Post-Op Procedure(s) (LRB): BiV Pacemaker Insertion CRT-P (N/A)   Subjective:  [name] [name] has no complaints.  Ready to go home today.  States pacemaker has been interrogated and is ready to go.  Objective: Vital signs in last 24 hours: Temp:  [97.7 F (36.5 C)-98.2 F (36.8 C)] 97.7 F (36.5 C) (01/13 0450) Pulse Rate:  [69-72] 70 (01/13 0450) Cardiac Rhythm: Ventricular paced (01/13 0702) Resp:  [18-20] 18 (01/13 0450) BP: (115-130)/(51-62) 118/51 (01/13 0450) SpO2:  [96 %-99 %] 96 % (01/13 0450) Weight:  [158 lb 1.6 oz (71.7 kg)] 158 lb 1.6 oz (71.7 kg) (01/13 0450)  Intake/Output this shift: Total I/O In: 360 [P.O.:360] Out: -   General appearance: alert, cooperative and no distress Heart: regular rate and rhythm and paced Lungs: clear to auscultation bilaterally Abdomen: soft, non-tender; bowel sounds normal; no masses,  no organomegaly Extremities: edema none appreciated Wound: clean and dry  Lab Results:  Recent Labs  11/17/16 1138 11/18/16 0418  WBC 13.1* 11.8*  HGB 10.2* 10.0*  HCT 30.8* 30.7*  PLT 229 260   BMET:  Recent Labs  11/19/16 0402 11/20/16 0438  NA 134* 134*  K 4.3 4.5  CL 98* 101  CO2 27 27  GLUCOSE 101* 105*  BUN 19 17  CREATININE 0.75 0.76  CALCIUM 8.6* 8.7*    PT/INR:  Recent Labs  11/20/16 0438  LABPROT 27.4*  INR 2.49   ABG    Component Value Date/Time   PHART 7.332 (L) 11/12/2016 0506   HCO3 21.4 11/12/2016 0506   TCO2 25 11/12/2016 1602   ACIDBASEDEF 5.0 (H) 11/12/2016 0506   O2SAT 52.8 11/18/2016 0429   CBG (last 3)  No results for input(s): GLUCAP in the last 72 hours.  Assessment/Plan: S/P Procedure(s) (LRB): BiV Pacemaker Insertion CRT-P (N/A)  1. CV- PPM in place, has been interrogated, A. Fib underneath-continue Amiodarone, Lisinopril- will place to cardiovert in about a month per Dr. Shirlee Latch 2. INR  2.49 will continue Coumadin at 5 mg daily 3. Pulm- no acute issues, continue IS 4. Renal- creatinine stable, continue diuretics 5. D/C PICC Line 6. Dispo- patient medically stable, pacer working well, plan for DCCV in about a month- will d/c home today    LOS: 9 days    [name] [name] 11/20/2016

## 2016-11-20 NOTE — Progress Notes (Signed)
CARDIAC REHAB PHASE I   PRE:  Rate/Rhythm: 70 paced    BP: sitting 121/43    SaO2: 95 RA  MODE:  Ambulation: 890 ft   POST:  Rate/Rhythm: 85 pacing    BP: sitting 125/46     SaO2: 97 RA  Tolerated well pushing IV pole. Feels good. Gave him Off the Beat book and asked pharmacist to order coumadin book. No other questions. [phone]   Harriet Masson CES, ACSM 11/20/2016 10:43 AM

## 2016-11-22 ENCOUNTER — Ambulatory Visit (INDEPENDENT_AMBULATORY_CARE_PROVIDER_SITE_OTHER): Payer: BLUE CROSS/BLUE SHIELD | Admitting: Pharmacist

## 2016-11-22 ENCOUNTER — Ambulatory Visit: Payer: Self-pay

## 2016-11-22 DIAGNOSIS — Z7901 Long term (current) use of anticoagulants: Secondary | ICD-10-CM | POA: Diagnosis not present

## 2016-11-22 LAB — POCT INR: INR: 4.7

## 2016-11-23 ENCOUNTER — Telehealth: Payer: Self-pay | Admitting: Physician Assistant

## 2016-11-23 NOTE — Telephone Encounter (Signed)
      [address]       [place] [address]             [phone]    JAMONTA RIOPELLE [phone]   S/P Mini MV Replacement, TV Repair, MAZE performed on 11/11/2016. PPM placement  1/11  Discharged home on 11/20/2016  Medications: Reviewed  Coumadin: yes discharged on 5 mg daily  INR check: Completed 1/15 elevated at 4.7, adjustments made by coumadin clinic  Problems/Concerns: Rash, relieving with Benadryl... Instructed patient to monitor if occurs after administration of specific medications.    Assessment:  Patient is doing very well.  Very happy to be at home.  He will continue coumadin therapy per Coumadin clinic.  Will continue to ambulate without difficulty.  Contact office if concerns or problems develop  Follow up Appointment:  11/29/2016 with PA's

## 2016-11-24 MED FILL — Heparin Sodium (Porcine) Inj 1000 Unit/ML: INTRAMUSCULAR | Qty: 2500 | Status: AC

## 2016-11-25 ENCOUNTER — Ambulatory Visit: Payer: BLUE CROSS/BLUE SHIELD | Admitting: Cardiology

## 2016-11-25 ENCOUNTER — Encounter (HOSPITAL_COMMUNITY): Payer: Self-pay | Admitting: Thoracic Surgery (Cardiothoracic Vascular Surgery)

## 2016-11-25 LAB — ECHO TEE
AV Area mean vel: 1.52 cm2
AV Mean grad: 6 mmHg
AV Peak grad: 14 mmHg
AV VEL mean LVOT/AV: 0.48
AV area mean vel ind: 0.79 cm2/m2
AV peak Index: 0.82
AV pk vel: 184 cm/s
AV vel: 1.75
AVAREAVTI: 1.58 cm2
AVAREAVTIIND: 0.91 cm2/m2
Ao pk vel: 0.5 m/s
CHL CUP LVOT MV VTI: 2.01
DOP CAL AO MEAN VELOCITY: 118 cm/s
LDCA: 3.14 cm2
LVOT MV VTI INDEX: 1.04 cm2/m2
LVOT VTI: 14.3 cm
LVOT peak VTI: 0.56 cm
LVOTD: 20 mm
LVOTPV: 92.4 cm/s
LVOTSV: 45 mL
MV Annulus VTI: 22.3 cm
MV M vel: 93.7
MVG: 5 mmHg
SINUS: 2.7 cm
STJ: 2.2 cm
VTI: 25.6 cm
Valve area index: 0.91
Valve area: 1.75 cm2

## 2016-11-25 NOTE — Anesthesia Postprocedure Evaluation (Addendum)
Anesthesia Post Note  Patient: [name] [name]  Procedure(s) Performed: Procedure(s) (LRB): MINIMALLY INVASIVE MITRAL VALVE REPLACEMENT (MVR) WITH 33 MM CARBOMEDICS OPTIFORM MECHANICAL VALVE (N/A) MINIMALLY INVASIVE MAZE PROCEDURE WITH APPLICATION OF ATRICLIP PRO 2 45 ON LAA (N/A) TRANSESOPHAGEAL ECHOCARDIOGRAM (TEE) (N/A) TRICUSPID VALVE REPAIR WITH EDWARDS MC3 TRICUSPID 28 MM ANNULOPLASTY RING MODEL 4900 (N/A)  Patient location during evaluation: ICU Anesthesia Type: General Level of consciousness: sedated Pain management: pain level controlled Vital Signs Assessment: post-procedure vital signs reviewed and stable Respiratory status: patient remains intubated per anesthesia plan Cardiovascular status: stable Anesthetic complications: no       Last Vitals:  Vitals:   11/19/16 2344 11/20/16 0450  BP: (!) 115/51 (!) 118/51  Pulse: 71 70  Resp: 20 18  Temp: 36.8 C 36.5 C    Last Pain:  Vitals:   11/20/16 0450  TempSrc: Oral  PainSc:                  Kylan Veach

## 2016-11-26 ENCOUNTER — Ambulatory Visit (INDEPENDENT_AMBULATORY_CARE_PROVIDER_SITE_OTHER): Payer: BLUE CROSS/BLUE SHIELD | Admitting: Pharmacist

## 2016-11-26 ENCOUNTER — Other Ambulatory Visit: Payer: Self-pay | Admitting: Thoracic Surgery (Cardiothoracic Vascular Surgery)

## 2016-11-26 ENCOUNTER — Telehealth (HOSPITAL_COMMUNITY): Payer: Self-pay

## 2016-11-26 DIAGNOSIS — Z9889 Other specified postprocedural states: Secondary | ICD-10-CM

## 2016-11-26 DIAGNOSIS — Z7901 Long term (current) use of anticoagulants: Secondary | ICD-10-CM

## 2016-11-26 LAB — POCT INR: INR: 2.1

## 2016-11-26 NOTE — Telephone Encounter (Addendum)
S/w Laquina H. From [facility] verifying benefits for pt for Cardiac Rehab. Primary Insurance is BCBS with $1,000.00 Deductible, 20% co-insurance and Out of Pocket $3,000.00, with everything met. No limit of visits.... Reference # [id number]... KJ

## 2016-11-29 ENCOUNTER — Ambulatory Visit (INDEPENDENT_AMBULATORY_CARE_PROVIDER_SITE_OTHER): Payer: Self-pay | Admitting: Physician Assistant

## 2016-11-29 ENCOUNTER — Ambulatory Visit
Admission: RE | Admit: 2016-11-29 | Discharge: 2016-11-29 | Disposition: A | Discharge status: Home / Self Care | Payer: BLUE CROSS/BLUE SHIELD | Source: Ambulatory Visit | Attending: Thoracic Surgery (Cardiothoracic Vascular Surgery) | Admitting: Thoracic Surgery (Cardiothoracic Vascular Surgery)

## 2016-11-29 VITALS — BP 122/62 | HR 61 | Resp 85 | Ht 70.0 in | Wt 157.4 lb

## 2016-11-29 DIAGNOSIS — I071 Rheumatic tricuspid insufficiency: Secondary | ICD-10-CM

## 2016-11-29 DIAGNOSIS — R0602 Shortness of breath: Secondary | ICD-10-CM | POA: Diagnosis not present

## 2016-11-29 DIAGNOSIS — Z9889 Other specified postprocedural states: Secondary | ICD-10-CM

## 2016-11-29 DIAGNOSIS — I340 Nonrheumatic mitral (valve) insufficiency: Secondary | ICD-10-CM

## 2016-11-29 DIAGNOSIS — I481 Persistent atrial fibrillation: Secondary | ICD-10-CM

## 2016-11-29 DIAGNOSIS — I4819 Other persistent atrial fibrillation: Secondary | ICD-10-CM

## 2016-11-29 DIAGNOSIS — Z952 Presence of prosthetic heart valve: Secondary | ICD-10-CM

## 2016-11-29 DIAGNOSIS — Z8679 Personal history of other diseases of the circulatory system: Secondary | ICD-10-CM

## 2016-11-29 MED ORDER — AMIODARONE HCL 200 MG PO TABS: 200.0000 mg | ORAL_TABLET | Freq: Every day | ORAL | 3 refills | Status: DC

## 2016-11-29 NOTE — Progress Notes (Signed)
HPI: Patient returns for routine postoperative follow-up having undergone Mini MVR on 11/12/2016.  The patient's early postoperative recovery while in the hospital was notable for development of junctional bradycardia ultimately requiring pacemaker implantation. Since hospital discharge the patient reports he is doing very well.  He states he feels great and is ready to be able to do more things as he recovers.  He does complain that he is very thirsty all of the time and is hopeful he can discontinue to diuretics.  His INR is around 2.1 and he is due to have it checked again on 1/24.  He is currently on an alternating dose of coumadin.  Finally he states his incisions are doing well.  Current Outpatient Prescriptions  Medication Sig Dispense Refill  . acetaminophen (TYLENOL) 500 MG tablet Take 1,000 mg by mouth every 6 (six) hours as needed for moderate pain or headache.    Marland Kitchen amiodarone (PACERONE) 200 MG tablet Take 1 tablet (200 mg total) by mouth daily. 60 tablet 3  . aspirin EC 81 MG EC tablet Take 1 tablet (81 mg total) by mouth daily.    . chlorhexidine (PERIDEX) 0.12 % solution Rinse with 15 mls twice daily for 30 seconds. Use after breakfast and at bedtime. Spit out excess. Do not swallow. 480 mL prn  . lisinopril (PRINIVIL,ZESTRIL) 2.5 MG tablet Take 1 tablet (2.5 mg total) by mouth 2 (two) times daily. 30 tablet 3  . Multiple Vitamin (MULTIVITAMIN) tablet Take 1 tablet by mouth daily.    Marland Kitchen warfarin (COUMADIN) 5 MG tablet Take 1 tablet (5 mg total) by mouth daily at 6 PM. (Patient taking differently: Take 5 mg by mouth daily at 6 PM. OR AS DIRECTED) 30 tablet 3   No current facility-administered medications for this visit.     Physical Exam:  BP 122/62 (BP Location: Left Arm, Patient Position: Sitting, Cuff Size: Normal)   Pulse 61   Resp (!) 85   Ht 5\' 10"  (1.778 m)   Wt 157 lb 6.4 oz (71.4 kg)   SpO2 97% Comment: ON RA  BMI 22.58 kg/m   Gen: no apparent distress Heart: RRR,  paced Lungs: CTA bilaterally Abd: soft non-tender, non-distended Ext: no edema present Incisions: well healed  Diagnostic Tests:  CXR: no pleural effusions present, no pneumothorax, post surgical changes present  A/P:  1. S/p MVR with Mechanical MVR- doing very well.  INR is 2.1 which is low for a mechanical mitral valve.  We would like his INR to be at least 3.0.  He is scheduled for repeat draw on 1/24 2. CV- Previous A. Fib, NSR if office today.. Will decrease Amiodarone to once daily for next 30 days then patient can discontinue 3. Renal- patients weight is below baseline, no LE edema present.. Will stop lasix and Spironolactone... Patient instructed to monitor weight and contact our office or Cardiology should he develop edema and weight gain 4. Dispo- patient doing very well, continue coumadin for mechanical valve, d/c diuretics, taper Amiodarone... RTC in 3 months with Dr. Charna Busman, PA-C [facility] [phone]

## 2016-11-29 NOTE — Patient Instructions (Signed)
You may return to driving an automobile as long as you are no longer requiring oral narcotic pain relievers during the daytime.  It would be wise to start driving only short distances during the daylight and gradually increase from there as you feel comfortable.  Continue to avoid any heavy lifting or strenuous use of your arms or shoulders for at least a total of three months from the time of surgery.  After three months you may gradually increase how much you lift or otherwise use your arms or chest as tolerated, with limits based upon whether or not activities lead to the return of significant discomfort.  You are encouraged to enroll and participate in the outpatient cardiac rehab program beginning as soon as practical.

## 2016-12-01 ENCOUNTER — Encounter: Payer: Self-pay | Admitting: Nurse Practitioner

## 2016-12-01 ENCOUNTER — Encounter: Payer: Self-pay | Admitting: *Deleted

## 2016-12-01 ENCOUNTER — Ambulatory Visit (INDEPENDENT_AMBULATORY_CARE_PROVIDER_SITE_OTHER): Payer: BLUE CROSS/BLUE SHIELD | Admitting: Nurse Practitioner

## 2016-12-01 ENCOUNTER — Ambulatory Visit (INDEPENDENT_AMBULATORY_CARE_PROVIDER_SITE_OTHER): Payer: BLUE CROSS/BLUE SHIELD | Admitting: *Deleted

## 2016-12-01 ENCOUNTER — Ambulatory Visit (INDEPENDENT_AMBULATORY_CARE_PROVIDER_SITE_OTHER): Payer: BLUE CROSS/BLUE SHIELD | Admitting: Pharmacist

## 2016-12-01 VITALS — BP 116/68 | HR 84 | Ht 70.0 in | Wt 157.0 lb

## 2016-12-01 DIAGNOSIS — I428 Other cardiomyopathies: Secondary | ICD-10-CM | POA: Diagnosis not present

## 2016-12-01 DIAGNOSIS — I071 Rheumatic tricuspid insufficiency: Secondary | ICD-10-CM

## 2016-12-01 DIAGNOSIS — I4891 Unspecified atrial fibrillation: Secondary | ICD-10-CM

## 2016-12-01 DIAGNOSIS — I340 Nonrheumatic mitral (valve) insufficiency: Secondary | ICD-10-CM | POA: Diagnosis not present

## 2016-12-01 DIAGNOSIS — I5022 Chronic systolic (congestive) heart failure: Secondary | ICD-10-CM

## 2016-12-01 DIAGNOSIS — Z7901 Long term (current) use of anticoagulants: Secondary | ICD-10-CM

## 2016-12-01 LAB — POCT INR: INR: 3.4

## 2016-12-01 NOTE — Progress Notes (Signed)
Wound check appointment. Steri-strips removed prior to wound apt by pt. Wound without redness or edema. . Normal device function. Stitch clipped from right lateral edge of incision, antibiotic ointment applied and covered. Thresholds, sensing, and impedances consistent with implant measurements. Device programmed at 3.5V  for extra safety margin until 3 month visit. Histogram distribution appropriate for patient and level of activity. 100 % AT/AF + Amiodarone and Coumadin. No high ventricular rates noted. Patient educated about wound care, arm mobility, lifting restrictions. ROV w/ device clinic 12/08/2016 for wound re-check. ROV in 3 months w/ GT. Pt also to call device clinic with serial number on home monitor to enroll in carelink.
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
## 2016-12-01 NOTE — Patient Instructions (Addendum)
Medication Instructions:  Your physician recommends that you continue on your current medications as directed. Please refer to the Current Medication list given to you today.   Labwork: None ordered  Testing/Procedures: None ordered  Follow-Up: Your physician recommends that you schedule a follow-up appointment in: KEEP YOUR APPOINTMENTS AS PLANNED  Any Other Special Instructions Will Be Listed Below (If Applicable).   If you need a refill on your cardiac medications before your next appointment, please call your pharmacy.   

## 2016-12-03 ENCOUNTER — Ambulatory Visit: Payer: Self-pay | Admitting: Cardiology

## 2016-12-08 ENCOUNTER — Ambulatory Visit (INDEPENDENT_AMBULATORY_CARE_PROVIDER_SITE_OTHER): Payer: BLUE CROSS/BLUE SHIELD | Admitting: *Deleted

## 2016-12-08 ENCOUNTER — Ambulatory Visit (INDEPENDENT_AMBULATORY_CARE_PROVIDER_SITE_OTHER): Payer: BLUE CROSS/BLUE SHIELD | Admitting: Pharmacist

## 2016-12-08 DIAGNOSIS — Z7901 Long term (current) use of anticoagulants: Secondary | ICD-10-CM

## 2016-12-08 DIAGNOSIS — Z950 Presence of cardiac pacemaker: Secondary | ICD-10-CM

## 2016-12-08 LAB — POCT INR: INR: 4.7

## 2016-12-08 NOTE — Progress Notes (Signed)
Wound recheck appointment.  Bandaid removed from site.  Incision edges approximated, wound well healed.  No swelling or redness noted at site.  Patient educated about wound care, arm mobility, and lifting restrictions.  Patient encouraged to contact Dr. Orvan July office for specific recommendations regarding returning to work with light duty restrictions.  Patient to call the Device Clinic when he returns home for additional assistance with setting up Carelink monitor.  ROV with Dr. Ladona Ridgel on 02/22/17.

## 2016-12-15 ENCOUNTER — Ambulatory Visit (INDEPENDENT_AMBULATORY_CARE_PROVIDER_SITE_OTHER): Payer: BLUE CROSS/BLUE SHIELD | Admitting: Pharmacist

## 2016-12-15 DIAGNOSIS — Z7901 Long term (current) use of anticoagulants: Secondary | ICD-10-CM | POA: Diagnosis not present

## 2016-12-15 LAB — POCT INR: INR: 3.3

## 2016-12-17 DIAGNOSIS — I4891 Unspecified atrial fibrillation: Secondary | ICD-10-CM | POA: Diagnosis not present

## 2016-12-27 ENCOUNTER — Ambulatory Visit (INDEPENDENT_AMBULATORY_CARE_PROVIDER_SITE_OTHER): Payer: BLUE CROSS/BLUE SHIELD | Admitting: Pharmacist Clinician (PhC)/ Clinical Pharmacy Specialist

## 2016-12-27 DIAGNOSIS — Z7901 Long term (current) use of anticoagulants: Secondary | ICD-10-CM | POA: Diagnosis not present

## 2016-12-27 LAB — POCT INR: INR: 2

## 2016-12-28 ENCOUNTER — Encounter (HOSPITAL_COMMUNITY)
Admission: RE | Admit: 2016-12-28 | Discharge: 2016-12-28 | Disposition: A | Discharge status: Home / Self Care | Payer: BLUE CROSS/BLUE SHIELD | Source: Ambulatory Visit | Attending: Cardiology | Admitting: Cardiology

## 2016-12-28 VITALS — BP 102/80 | HR 86 | Ht 70.0 in | Wt 171.3 lb

## 2016-12-28 DIAGNOSIS — Z48812 Encounter for surgical aftercare following surgery on the circulatory system: Secondary | ICD-10-CM | POA: Insufficient documentation

## 2016-12-28 DIAGNOSIS — Z9889 Other specified postprocedural states: Secondary | ICD-10-CM | POA: Diagnosis not present

## 2016-12-28 DIAGNOSIS — Z952 Presence of prosthetic heart valve: Secondary | ICD-10-CM | POA: Insufficient documentation

## 2016-12-28 DIAGNOSIS — Z950 Presence of cardiac pacemaker: Secondary | ICD-10-CM | POA: Diagnosis not present

## 2016-12-28 DIAGNOSIS — Z954 Presence of other heart-valve replacement: Secondary | ICD-10-CM | POA: Diagnosis not present

## 2016-12-28 DIAGNOSIS — Z8679 Personal history of other diseases of the circulatory system: Secondary | ICD-10-CM | POA: Insufficient documentation

## 2016-12-28 NOTE — Progress Notes (Signed)
Cardiac Rehab Medication Review by a Pharmacist  Does the patient  feel that his/her medications are working for him/her?  yes  Has the patient been experiencing any side effects to the medications prescribed?  No- Complains of restlessness but this is unlikely to be due to any of his medications.   Does the patient measure his/her own blood pressure or blood glucose at home?  no   Does the patient have any problems obtaining medications due to transportation or finances?   no  Understanding of regimen: good Understanding of indications: good Potential of compliance: good    Pharmacist comments: Mr. Debois is a 61 yo male who presents to cardiac rehab with his wife. Mr. Toman has a good list of his medications and seems to have a good understanding of his regimen. He was encouraged to try and check his blood pressure at home at least once a week.     Hillis Range, PharmD 12/28/2016 8:04 AM
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
## 2017-01-03 ENCOUNTER — Encounter (HOSPITAL_COMMUNITY): Payer: Self-pay

## 2017-01-03 ENCOUNTER — Encounter (HOSPITAL_COMMUNITY)
Admission: RE | Admit: 2017-01-03 | Discharge: 2017-01-03 | Disposition: A | Discharge status: Home / Self Care | Payer: BLUE CROSS/BLUE SHIELD | Source: Ambulatory Visit | Attending: Cardiology | Admitting: Cardiology

## 2017-01-03 DIAGNOSIS — Z952 Presence of prosthetic heart valve: Secondary | ICD-10-CM | POA: Diagnosis not present

## 2017-01-03 DIAGNOSIS — Z954 Presence of other heart-valve replacement: Secondary | ICD-10-CM | POA: Diagnosis not present

## 2017-01-03 DIAGNOSIS — Z9889 Other specified postprocedural states: Secondary | ICD-10-CM

## 2017-01-03 DIAGNOSIS — Z950 Presence of cardiac pacemaker: Secondary | ICD-10-CM

## 2017-01-03 DIAGNOSIS — Z8679 Personal history of other diseases of the circulatory system: Secondary | ICD-10-CM | POA: Diagnosis not present

## 2017-01-03 DIAGNOSIS — Z48812 Encounter for surgical aftercare following surgery on the circulatory system: Secondary | ICD-10-CM | POA: Diagnosis not present

## 2017-01-03 NOTE — Progress Notes (Signed)
Daily Session Note  Patient Details  Name: [name] [name] MRN: [id number] Date of Birth: [birth year] Referring Provider:   Flowsheet Row CARDIAC REHAB PHASE II ORIENTATION from 12/28/2016 in [place]  Referring Provider  [place], Peter, MD.      Encounter Date: 01/03/2017  Check In:     Session Check In - 01/03/17 0853      Check-In   Location [facility]   Staff Present Dorna Bloom, MS, ACSM RCEP, Exercise Physiologist;Zeinab Rodwell, RN, BSN   Supervising physician immediately available to respond to emergencies [facility] immediately available   Physician(s) Dr. Posey Pronto    Medication changes reported     No   Fall or balance concerns reported    No   Tobacco Cessation No Change   Warm-up and Cool-down Performed as group-led instruction   Resistance Training Performed Yes   VAD Patient? No     Pain Assessment   Currently in Pain? No/denies      Capillary Blood Glucose: No results found for this or any previous visit (from the past 24 hour(s)).    History  Smoking Status  . Former Smoker  . Packs/day: 1.00  . Years: 41.00  . Types: Cigarettes  . Quit date: 10/02/2016  Smokeless Tobacco  . Former Systems developer  . Types: Chew  . Quit date: 10/25/1997    Comment: RECENTLY QUIT SMOKING    Goals Met:  Exercise tolerated well  Goals Unmet:  Not Applicable  Comments: Pt started cardiac rehab today.  Pt tolerated light exercise without difficulty. VSS, telemetry-ventricular paced.   asymptomatic.  Medication list reconciled. Pt denies barriers to medicaiton compliance.  PSYCHOSOCIAL ASSESSMENT:  PHQ-0.  Pt exhibits positive coping skills, hopeful outlook with supportive family. No psychosocial needs identified at this time, no psychosocial interventions necessary.    Pt enjoys "tinkering" in Therapist, music work such as Database administrator, Careers adviser.  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  This is pt self identified cardiac rehab goal.  Pt given vocational rehab packet at his request.  Pt encouraged to discuss work duties and requirements with Dr. Roxy Manns and Dr. [place] who will make final return to work clearance determinations. Pt encouraged to participate in cardiac rehab activities in addition to home exercise routine to rebuild strength/stamina to progress towards work goals.   Pt oriented to exercise equipment and routine.    Understanding verbalized.   Dr. Fransico Him is Medical Director for Cardiac Rehab at [facility].

## 2017-01-05 ENCOUNTER — Encounter (HOSPITAL_COMMUNITY)
Admission: RE | Admit: 2017-01-05 | Discharge: 2017-01-05 | Disposition: A | Discharge status: Home / Self Care | Payer: BLUE CROSS/BLUE SHIELD | Source: Ambulatory Visit | Attending: Cardiology | Admitting: Cardiology

## 2017-01-05 ENCOUNTER — Encounter (HOSPITAL_COMMUNITY): Payer: Self-pay

## 2017-01-05 DIAGNOSIS — Z952 Presence of prosthetic heart valve: Secondary | ICD-10-CM

## 2017-01-05 DIAGNOSIS — Z9889 Other specified postprocedural states: Secondary | ICD-10-CM

## 2017-01-05 DIAGNOSIS — Z950 Presence of cardiac pacemaker: Secondary | ICD-10-CM

## 2017-01-05 DIAGNOSIS — Z954 Presence of other heart-valve replacement: Secondary | ICD-10-CM | POA: Diagnosis not present

## 2017-01-05 DIAGNOSIS — Z8679 Personal history of other diseases of the circulatory system: Secondary | ICD-10-CM | POA: Diagnosis not present

## 2017-01-05 DIAGNOSIS — Z48812 Encounter for surgical aftercare following surgery on the circulatory system: Secondary | ICD-10-CM | POA: Diagnosis not present
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
## 2017-01-06 ENCOUNTER — Other Ambulatory Visit: Payer: Self-pay | Admitting: Physician Assistant

## 2017-01-07 ENCOUNTER — Encounter (HOSPITAL_COMMUNITY)
Admission: RE | Admit: 2017-01-07 | Discharge: 2017-01-07 | Disposition: A | Discharge status: Home / Self Care | Payer: BLUE CROSS/BLUE SHIELD | Source: Ambulatory Visit | Attending: Cardiology | Admitting: Cardiology

## 2017-01-07 ENCOUNTER — Ambulatory Visit (INDEPENDENT_AMBULATORY_CARE_PROVIDER_SITE_OTHER): Payer: BLUE CROSS/BLUE SHIELD | Admitting: Pharmacist Clinician (PhC)/ Clinical Pharmacy Specialist

## 2017-01-07 DIAGNOSIS — Z48812 Encounter for surgical aftercare following surgery on the circulatory system: Secondary | ICD-10-CM | POA: Insufficient documentation

## 2017-01-07 DIAGNOSIS — Z9889 Other specified postprocedural states: Secondary | ICD-10-CM | POA: Insufficient documentation

## 2017-01-07 DIAGNOSIS — Z8679 Personal history of other diseases of the circulatory system: Secondary | ICD-10-CM | POA: Diagnosis not present

## 2017-01-07 DIAGNOSIS — Z954 Presence of other heart-valve replacement: Secondary | ICD-10-CM | POA: Diagnosis not present

## 2017-01-07 DIAGNOSIS — Z950 Presence of cardiac pacemaker: Secondary | ICD-10-CM | POA: Diagnosis not present

## 2017-01-07 DIAGNOSIS — Z7901 Long term (current) use of anticoagulants: Secondary | ICD-10-CM | POA: Diagnosis not present

## 2017-01-07 DIAGNOSIS — Z952 Presence of prosthetic heart valve: Secondary | ICD-10-CM | POA: Insufficient documentation

## 2017-01-07 LAB — POCT INR: INR: 2.5

## 2017-01-10 ENCOUNTER — Encounter (HOSPITAL_COMMUNITY)
Admission: RE | Admit: 2017-01-10 | Discharge: 2017-01-10 | Disposition: A | Discharge status: Home / Self Care | Payer: BLUE CROSS/BLUE SHIELD | Source: Ambulatory Visit | Attending: Cardiology | Admitting: Cardiology

## 2017-01-10 DIAGNOSIS — Z952 Presence of prosthetic heart valve: Secondary | ICD-10-CM

## 2017-01-10 DIAGNOSIS — Z954 Presence of other heart-valve replacement: Secondary | ICD-10-CM | POA: Diagnosis not present

## 2017-01-10 DIAGNOSIS — Z8679 Personal history of other diseases of the circulatory system: Secondary | ICD-10-CM | POA: Diagnosis not present

## 2017-01-10 DIAGNOSIS — Z950 Presence of cardiac pacemaker: Secondary | ICD-10-CM | POA: Diagnosis not present

## 2017-01-10 DIAGNOSIS — Z9889 Other specified postprocedural states: Secondary | ICD-10-CM | POA: Diagnosis not present

## 2017-01-10 DIAGNOSIS — Z48812 Encounter for surgical aftercare following surgery on the circulatory system: Secondary | ICD-10-CM | POA: Diagnosis not present

## 2017-01-10 NOTE — Progress Notes (Signed)
[name] [name] 61 y.o. male Nutrition Note Spoke with pt. Nutrition Plan and Nutrition Survey goals reviewed with pt. Pt is following Step 1 of the Therapeutic Lifestyle Changes diet. Per discussion, pt does not avoid salty food. Pt rarely adds salt to food. Pt eats out frequently. Pt expressed understanding of the information reviewed. Pt aware of nutrition education classes offered.  Lab Results  Component Value Date   HGBA1C 5.6 11/09/2016   Wt Readings from Last 3 Encounters:  12/28/16 171 lb 4.8 oz (77.7 kg)  12/01/16 157 lb (71.2 kg)  11/29/16 157 lb 6.4 oz (71.4 kg)   Nutrition Diagnosis ? Food-and nutrition-related knowledge deficit related to lack of exposure to information as related to diagnosis of: ? CVD  Nutrition Intervention ? Pt's individual nutrition plan reviewed with pt. ? Benefits of adopting Therapeutic Lifestyle Changes discussed when Medficts reviewed. ? Pt to attend the Portion Distortion class ? Pt to attend the   ? Nutrition I class                  ? Nutrition II class   ? Pt given handouts for: ? Nutrition I class ? Nutrition II class ? Continue client-centered nutrition education by RD, as part of interdisciplinary care. Goal(s) ? Pt to identify and limit food sources of saturated fat, trans fat, and sodium ? Pt to describe the benefit of including fruits, vegetables, whole grains, and low-fat dairy products in a heart healthy meal plan. Monitor and Evaluate progress toward nutrition goal with team. Mickle Plumb, M.Ed, RD, LDN, CDE 01/10/2017 9:49 AM

## 2017-01-10 NOTE — Progress Notes (Signed)
Reviewed home exercise with pt today.  Pt plans to walk at home for exercise.  Reviewed THR, pulse, RPE, sign and symptoms, NTG use, and when to call 911 or MD.  Also discussed weather considerations and indoor options.  Pt voiced understanding.  

## 2017-01-12 ENCOUNTER — Encounter (HOSPITAL_COMMUNITY)
Admission: RE | Admit: 2017-01-12 | Discharge: 2017-01-12 | Disposition: A | Discharge status: Home / Self Care | Payer: BLUE CROSS/BLUE SHIELD | Source: Ambulatory Visit | Attending: Cardiology | Admitting: Cardiology

## 2017-01-12 DIAGNOSIS — Z9889 Other specified postprocedural states: Secondary | ICD-10-CM

## 2017-01-12 DIAGNOSIS — Z952 Presence of prosthetic heart valve: Secondary | ICD-10-CM

## 2017-01-12 DIAGNOSIS — Z950 Presence of cardiac pacemaker: Secondary | ICD-10-CM

## 2017-01-12 DIAGNOSIS — Z954 Presence of other heart-valve replacement: Secondary | ICD-10-CM | POA: Diagnosis not present

## 2017-01-12 DIAGNOSIS — Z48812 Encounter for surgical aftercare following surgery on the circulatory system: Secondary | ICD-10-CM | POA: Diagnosis not present

## 2017-01-12 DIAGNOSIS — Z8679 Personal history of other diseases of the circulatory system: Secondary | ICD-10-CM | POA: Diagnosis not present

## 2017-01-14 ENCOUNTER — Encounter (HOSPITAL_COMMUNITY)
Admission: RE | Admit: 2017-01-14 | Discharge: 2017-01-14 | Disposition: A | Discharge status: Home / Self Care | Payer: BLUE CROSS/BLUE SHIELD | Source: Ambulatory Visit | Attending: Cardiology | Admitting: Cardiology

## 2017-01-14 DIAGNOSIS — Z48812 Encounter for surgical aftercare following surgery on the circulatory system: Secondary | ICD-10-CM | POA: Diagnosis not present

## 2017-01-14 DIAGNOSIS — Z952 Presence of prosthetic heart valve: Secondary | ICD-10-CM

## 2017-01-14 DIAGNOSIS — Z954 Presence of other heart-valve replacement: Secondary | ICD-10-CM | POA: Diagnosis not present

## 2017-01-14 DIAGNOSIS — Z950 Presence of cardiac pacemaker: Secondary | ICD-10-CM

## 2017-01-14 DIAGNOSIS — Z9889 Other specified postprocedural states: Secondary | ICD-10-CM

## 2017-01-14 DIAGNOSIS — Z8679 Personal history of other diseases of the circulatory system: Secondary | ICD-10-CM | POA: Diagnosis not present

## 2017-01-17 ENCOUNTER — Encounter (HOSPITAL_COMMUNITY)
Admission: RE | Admit: 2017-01-17 | Discharge: 2017-01-17 | Disposition: A | Discharge status: Home / Self Care | Payer: BLUE CROSS/BLUE SHIELD | Source: Ambulatory Visit | Attending: Cardiology | Admitting: Cardiology

## 2017-01-17 DIAGNOSIS — Z954 Presence of other heart-valve replacement: Secondary | ICD-10-CM | POA: Diagnosis not present

## 2017-01-17 DIAGNOSIS — Z952 Presence of prosthetic heart valve: Secondary | ICD-10-CM

## 2017-01-17 DIAGNOSIS — Z8679 Personal history of other diseases of the circulatory system: Secondary | ICD-10-CM | POA: Diagnosis not present

## 2017-01-17 DIAGNOSIS — Z950 Presence of cardiac pacemaker: Secondary | ICD-10-CM

## 2017-01-17 DIAGNOSIS — Z9889 Other specified postprocedural states: Secondary | ICD-10-CM | POA: Diagnosis not present

## 2017-01-17 DIAGNOSIS — Z48812 Encounter for surgical aftercare following surgery on the circulatory system: Secondary | ICD-10-CM | POA: Diagnosis not present
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
## 2017-01-18 ENCOUNTER — Ambulatory Visit (INDEPENDENT_AMBULATORY_CARE_PROVIDER_SITE_OTHER): Payer: BLUE CROSS/BLUE SHIELD | Admitting: Pharmacist Clinician (PhC)/ Clinical Pharmacy Specialist

## 2017-01-18 ENCOUNTER — Ambulatory Visit (INDEPENDENT_AMBULATORY_CARE_PROVIDER_SITE_OTHER): Payer: BLUE CROSS/BLUE SHIELD | Admitting: Cardiology

## 2017-01-18 ENCOUNTER — Encounter: Payer: Self-pay | Admitting: Cardiology

## 2017-01-18 VITALS — BP 116/66 | HR 90 | Ht 70.0 in | Wt 174.0 lb

## 2017-01-18 DIAGNOSIS — I428 Other cardiomyopathies: Secondary | ICD-10-CM | POA: Diagnosis not present

## 2017-01-18 DIAGNOSIS — I340 Nonrheumatic mitral (valve) insufficiency: Secondary | ICD-10-CM

## 2017-01-18 DIAGNOSIS — I071 Rheumatic tricuspid insufficiency: Secondary | ICD-10-CM | POA: Diagnosis not present

## 2017-01-18 DIAGNOSIS — Z7901 Long term (current) use of anticoagulants: Secondary | ICD-10-CM

## 2017-01-18 DIAGNOSIS — Z954 Presence of other heart-valve replacement: Secondary | ICD-10-CM

## 2017-01-18 DIAGNOSIS — I4891 Unspecified atrial fibrillation: Secondary | ICD-10-CM

## 2017-01-18 DIAGNOSIS — I5022 Chronic systolic (congestive) heart failure: Secondary | ICD-10-CM

## 2017-01-18 LAB — POCT INR: INR: 2.1

## 2017-01-18 MED ORDER — LISINOPRIL 5 MG PO TABS: 5.0000 mg | ORAL_TABLET | Freq: Every day | ORAL | 3 refills | Status: DC

## 2017-01-18 NOTE — Patient Instructions (Signed)
Increase lisinopril to 5 mg daily  Continue your other therapy  We will schedule you for an Echocardiogram in one month  I will see you afterwards

## 2017-01-19 ENCOUNTER — Encounter (HOSPITAL_COMMUNITY)
Admission: RE | Admit: 2017-01-19 | Discharge: 2017-01-19 | Disposition: A | Discharge status: Home / Self Care | Payer: BLUE CROSS/BLUE SHIELD | Source: Ambulatory Visit | Attending: Cardiology | Admitting: Cardiology

## 2017-01-19 DIAGNOSIS — Z954 Presence of other heart-valve replacement: Secondary | ICD-10-CM | POA: Diagnosis not present

## 2017-01-19 DIAGNOSIS — Z8679 Personal history of other diseases of the circulatory system: Secondary | ICD-10-CM | POA: Diagnosis not present

## 2017-01-19 DIAGNOSIS — Z9889 Other specified postprocedural states: Secondary | ICD-10-CM

## 2017-01-19 DIAGNOSIS — Z48812 Encounter for surgical aftercare following surgery on the circulatory system: Secondary | ICD-10-CM | POA: Diagnosis not present

## 2017-01-19 DIAGNOSIS — Z950 Presence of cardiac pacemaker: Secondary | ICD-10-CM

## 2017-01-19 DIAGNOSIS — Z952 Presence of prosthetic heart valve: Secondary | ICD-10-CM | POA: Diagnosis not present

## 2017-01-21 ENCOUNTER — Encounter (HOSPITAL_COMMUNITY)
Admission: RE | Admit: 2017-01-21 | Discharge: 2017-01-21 | Disposition: A | Discharge status: Home / Self Care | Payer: BLUE CROSS/BLUE SHIELD | Source: Ambulatory Visit | Attending: Cardiology | Admitting: Cardiology

## 2017-01-21 DIAGNOSIS — Z48812 Encounter for surgical aftercare following surgery on the circulatory system: Secondary | ICD-10-CM | POA: Diagnosis not present

## 2017-01-21 DIAGNOSIS — Z952 Presence of prosthetic heart valve: Secondary | ICD-10-CM

## 2017-01-21 DIAGNOSIS — Z950 Presence of cardiac pacemaker: Secondary | ICD-10-CM

## 2017-01-21 DIAGNOSIS — Z9889 Other specified postprocedural states: Secondary | ICD-10-CM

## 2017-01-21 DIAGNOSIS — Z954 Presence of other heart-valve replacement: Secondary | ICD-10-CM | POA: Diagnosis not present

## 2017-01-21 DIAGNOSIS — Z8679 Personal history of other diseases of the circulatory system: Secondary | ICD-10-CM | POA: Diagnosis not present

## 2017-01-24 ENCOUNTER — Encounter (HOSPITAL_COMMUNITY)
Admission: RE | Admit: 2017-01-24 | Discharge: 2017-01-24 | Disposition: A | Discharge status: Home / Self Care | Payer: BLUE CROSS/BLUE SHIELD | Source: Ambulatory Visit | Attending: Cardiology | Admitting: Cardiology

## 2017-01-24 DIAGNOSIS — Z952 Presence of prosthetic heart valve: Secondary | ICD-10-CM

## 2017-01-24 DIAGNOSIS — Z9889 Other specified postprocedural states: Secondary | ICD-10-CM | POA: Diagnosis not present

## 2017-01-24 DIAGNOSIS — Z8679 Personal history of other diseases of the circulatory system: Secondary | ICD-10-CM | POA: Diagnosis not present

## 2017-01-24 DIAGNOSIS — Z48812 Encounter for surgical aftercare following surgery on the circulatory system: Secondary | ICD-10-CM | POA: Diagnosis not present

## 2017-01-24 DIAGNOSIS — Z950 Presence of cardiac pacemaker: Secondary | ICD-10-CM

## 2017-01-24 DIAGNOSIS — Z954 Presence of other heart-valve replacement: Secondary | ICD-10-CM | POA: Diagnosis not present

## 2017-01-26 ENCOUNTER — Encounter (HOSPITAL_COMMUNITY)
Admission: RE | Admit: 2017-01-26 | Discharge: 2017-01-26 | Disposition: A | Discharge status: Home / Self Care | Payer: BLUE CROSS/BLUE SHIELD | Source: Ambulatory Visit | Attending: Cardiology | Admitting: Cardiology

## 2017-01-26 ENCOUNTER — Ambulatory Visit (INDEPENDENT_AMBULATORY_CARE_PROVIDER_SITE_OTHER): Payer: BLUE CROSS/BLUE SHIELD | Admitting: Pharmacist Clinician (PhC)/ Clinical Pharmacy Specialist

## 2017-01-26 DIAGNOSIS — Z9889 Other specified postprocedural states: Secondary | ICD-10-CM

## 2017-01-26 DIAGNOSIS — Z950 Presence of cardiac pacemaker: Secondary | ICD-10-CM

## 2017-01-26 DIAGNOSIS — Z7901 Long term (current) use of anticoagulants: Secondary | ICD-10-CM

## 2017-01-26 DIAGNOSIS — Z48812 Encounter for surgical aftercare following surgery on the circulatory system: Secondary | ICD-10-CM | POA: Diagnosis not present

## 2017-01-26 DIAGNOSIS — Z952 Presence of prosthetic heart valve: Secondary | ICD-10-CM

## 2017-01-26 DIAGNOSIS — Z8679 Personal history of other diseases of the circulatory system: Secondary | ICD-10-CM | POA: Diagnosis not present

## 2017-01-26 DIAGNOSIS — Z954 Presence of other heart-valve replacement: Secondary | ICD-10-CM | POA: Diagnosis not present

## 2017-01-26 LAB — POCT INR: INR: 2.7

## 2017-01-28 ENCOUNTER — Encounter (HOSPITAL_COMMUNITY)
Admission: RE | Admit: 2017-01-28 | Discharge: 2017-01-28 | Disposition: A | Discharge status: Home / Self Care | Payer: BLUE CROSS/BLUE SHIELD | Source: Ambulatory Visit | Attending: Cardiology | Admitting: Cardiology

## 2017-01-28 DIAGNOSIS — Z48812 Encounter for surgical aftercare following surgery on the circulatory system: Secondary | ICD-10-CM | POA: Diagnosis not present

## 2017-01-28 DIAGNOSIS — Z9889 Other specified postprocedural states: Secondary | ICD-10-CM

## 2017-01-28 DIAGNOSIS — Z954 Presence of other heart-valve replacement: Secondary | ICD-10-CM | POA: Diagnosis not present

## 2017-01-28 DIAGNOSIS — Z952 Presence of prosthetic heart valve: Secondary | ICD-10-CM

## 2017-01-28 DIAGNOSIS — Z950 Presence of cardiac pacemaker: Secondary | ICD-10-CM

## 2017-01-28 DIAGNOSIS — Z8679 Personal history of other diseases of the circulatory system: Secondary | ICD-10-CM | POA: Diagnosis not present

## 2017-01-31 ENCOUNTER — Encounter (HOSPITAL_COMMUNITY)
Admission: RE | Admit: 2017-01-31 | Discharge: 2017-01-31 | Disposition: A | Discharge status: Home / Self Care | Payer: BLUE CROSS/BLUE SHIELD | Source: Ambulatory Visit | Attending: Cardiology | Admitting: Cardiology

## 2017-01-31 DIAGNOSIS — Z950 Presence of cardiac pacemaker: Secondary | ICD-10-CM

## 2017-01-31 DIAGNOSIS — Z952 Presence of prosthetic heart valve: Secondary | ICD-10-CM

## 2017-01-31 DIAGNOSIS — Z9889 Other specified postprocedural states: Secondary | ICD-10-CM

## 2017-01-31 DIAGNOSIS — Z954 Presence of other heart-valve replacement: Secondary | ICD-10-CM | POA: Diagnosis not present

## 2017-01-31 DIAGNOSIS — Z48812 Encounter for surgical aftercare following surgery on the circulatory system: Secondary | ICD-10-CM | POA: Diagnosis not present

## 2017-01-31 DIAGNOSIS — Z8679 Personal history of other diseases of the circulatory system: Secondary | ICD-10-CM | POA: Diagnosis not present

## 2017-02-01 NOTE — Progress Notes (Signed)
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 physical activity/exercise needs.;Develop an individualized exercise prescription for aerobic and resistive training based on initial evaluation findings, risk stratification, comorbidities and participant's personal goals.   Expected Outcomes Achievement of increased cardiorespiratory fitness and enhanced flexibility, muscular endurance and strength shown through measurements of functional capacity and personal statement of participant.      Core Components/Risk Factors/Patient Goals Review:      Goals and Risk Factor Review    Row Name 01/26/17 1228             Core  Components/Risk Factors/Patient Goals Review   Personal Goals Review Other       Review pt is eagerly participating in CR activities.  pt has resumed some yard work and is walking at home on non CR days. pt cautioned  for proper healing to follow exertional guidelines as directed avoiding overexertion until MD permission given.  understanding verbalized. ecommended        Expected Outcomes pt will continue CR attendance focusing on exercise guidelines to increase strength/stamina and foster healing. pt encouraged to attend nutrition and RF education offerings for overall well being.           Core Components/Risk Factors/Patient Goals at Discharge (Final Review):      Goals and Risk Factor Review - 01/26/17 1228      Core Components/Risk Factors/Patient Goals Review   Personal Goals Review Other   Review pt is eagerly participating in CR activities.  pt has resumed some yard work and is walking at home on non CR days. pt cautioned  for proper healing to follow exertional guidelines as directed avoiding overexertion until MD permission given.  understanding verbalized. ecommended    Expected Outcomes pt will continue CR attendance focusing on exercise guidelines to increase strength/stamina and foster healing. pt encouraged  to attend nutrition and RF education offerings for overall well being.       ITP Comments:     ITP Comments    Row Name 12/28/16 0801           ITP Comments Medical Director- Dr. Armanda Magic, MD.          Comments: Pt is making expected progress toward personal goals after completing 14 sessions. Recommend continued exercise and life style modification education including  stress management and relaxation techniques to decrease cardiac risk profile.

## 2017-02-02 ENCOUNTER — Encounter (HOSPITAL_COMMUNITY)
Admission: RE | Admit: 2017-02-02 | Discharge: 2017-02-02 | Disposition: A | Discharge status: Home / Self Care | Payer: BLUE CROSS/BLUE SHIELD | Source: Ambulatory Visit | Attending: Cardiology | Admitting: Cardiology

## 2017-02-02 DIAGNOSIS — Z950 Presence of cardiac pacemaker: Secondary | ICD-10-CM | POA: Diagnosis not present

## 2017-02-02 DIAGNOSIS — Z954 Presence of other heart-valve replacement: Secondary | ICD-10-CM | POA: Diagnosis not present

## 2017-02-02 DIAGNOSIS — Z9889 Other specified postprocedural states: Secondary | ICD-10-CM | POA: Diagnosis not present

## 2017-02-02 DIAGNOSIS — Z8679 Personal history of other diseases of the circulatory system: Secondary | ICD-10-CM | POA: Diagnosis not present

## 2017-02-02 DIAGNOSIS — Z952 Presence of prosthetic heart valve: Secondary | ICD-10-CM

## 2017-02-02 DIAGNOSIS — Z48812 Encounter for surgical aftercare following surgery on the circulatory system: Secondary | ICD-10-CM | POA: Diagnosis not present

## 2017-02-04 ENCOUNTER — Telehealth (HOSPITAL_COMMUNITY): Payer: Self-pay

## 2017-02-04 ENCOUNTER — Encounter (HOSPITAL_COMMUNITY)
Admission: RE | Admit: 2017-02-04 | Discharge: 2017-02-04 | Disposition: A | Discharge status: Home / Self Care | Payer: BLUE CROSS/BLUE SHIELD | Source: Ambulatory Visit | Attending: Cardiology | Admitting: Cardiology

## 2017-02-04 DIAGNOSIS — Z950 Presence of cardiac pacemaker: Secondary | ICD-10-CM | POA: Diagnosis not present

## 2017-02-04 DIAGNOSIS — Z48812 Encounter for surgical aftercare following surgery on the circulatory system: Secondary | ICD-10-CM | POA: Diagnosis not present

## 2017-02-04 DIAGNOSIS — Z9889 Other specified postprocedural states: Secondary | ICD-10-CM

## 2017-02-04 DIAGNOSIS — Z954 Presence of other heart-valve replacement: Secondary | ICD-10-CM | POA: Diagnosis not present

## 2017-02-04 DIAGNOSIS — Z8679 Personal history of other diseases of the circulatory system: Secondary | ICD-10-CM | POA: Diagnosis not present

## 2017-02-04 DIAGNOSIS — Z952 Presence of prosthetic heart valve: Secondary | ICD-10-CM | POA: Diagnosis not present

## 2017-02-04 NOTE — Telephone Encounter (Signed)
-----   Message from Peter M Swaz[place], MD sent at 02/03/2017  4:34 PM EDT ----- Regarding: RE: HIIT I am in agreement with your recommendations Peter Sw[place]iland MD, [facility]  ----- Message ----- From: Paulita Fujita Sent: 02/03/2017   2:10 PM To: Peter M [place], MD Subject: HIIT                                           Your patient [name] [name] is interested in doing high intensity interval training (HIIT) in Cardiac Rehab.  They have been in program for 4 weeks and have been doing great.  We would like to change their exercise prescription to include HIIT.  Their current THR is 128 (40-80%) and we would like to increase it to 152 max (95 %) for HIIT.  Their RPE levels for the HIIT would reach up to 16-17 and active rest would be 11-13.  They would start at 2 min of active rest and 30 sec of high intensity and progress as tolerated. If you are agreeable to this change in exercise prescription please let us know.   Patient has been in cardiac rehab approximately 4 weeks and doing is well. Pt has been averaging 5.0 METS and BP/HR has been in stabled with exercise. Pt is interested in doing strength training. If you feel this patient is appropriate to begin strength training please f/u with cardiac rehab staff.  Thanks so much for your help!   Warrick Parisian, MS,ACSM RCEP

## 2017-02-07 ENCOUNTER — Encounter (HOSPITAL_COMMUNITY)
Admission: RE | Admit: 2017-02-07 | Discharge: 2017-02-07 | Disposition: A | Discharge status: Home / Self Care | Payer: BLUE CROSS/BLUE SHIELD | Source: Ambulatory Visit | Attending: Cardiology | Admitting: Cardiology

## 2017-02-07 DIAGNOSIS — Z950 Presence of cardiac pacemaker: Secondary | ICD-10-CM

## 2017-02-07 DIAGNOSIS — Z9889 Other specified postprocedural states: Secondary | ICD-10-CM | POA: Diagnosis not present

## 2017-02-07 DIAGNOSIS — Z8679 Personal history of other diseases of the circulatory system: Secondary | ICD-10-CM | POA: Diagnosis not present

## 2017-02-07 DIAGNOSIS — Z48812 Encounter for surgical aftercare following surgery on the circulatory system: Secondary | ICD-10-CM | POA: Insufficient documentation

## 2017-02-07 DIAGNOSIS — Z952 Presence of prosthetic heart valve: Secondary | ICD-10-CM | POA: Diagnosis not present

## 2017-02-07 DIAGNOSIS — Z954 Presence of other heart-valve replacement: Secondary | ICD-10-CM | POA: Insufficient documentation

## 2017-02-08 ENCOUNTER — Encounter: Payer: Self-pay | Admitting: Internal Medicine

## 2017-02-09 ENCOUNTER — Encounter (HOSPITAL_COMMUNITY)
Admission: RE | Admit: 2017-02-09 | Discharge: 2017-02-09 | Disposition: A | Discharge status: Home / Self Care | Payer: BLUE CROSS/BLUE SHIELD | Source: Ambulatory Visit | Attending: Cardiology | Admitting: Cardiology

## 2017-02-09 ENCOUNTER — Ambulatory Visit (INDEPENDENT_AMBULATORY_CARE_PROVIDER_SITE_OTHER): Payer: BLUE CROSS/BLUE SHIELD | Admitting: Pharmacist Clinician (PhC)/ Clinical Pharmacy Specialist

## 2017-02-09 DIAGNOSIS — Z9889 Other specified postprocedural states: Secondary | ICD-10-CM | POA: Diagnosis not present

## 2017-02-09 DIAGNOSIS — Z950 Presence of cardiac pacemaker: Secondary | ICD-10-CM | POA: Diagnosis not present

## 2017-02-09 DIAGNOSIS — Z48812 Encounter for surgical aftercare following surgery on the circulatory system: Secondary | ICD-10-CM | POA: Diagnosis not present

## 2017-02-09 DIAGNOSIS — Z954 Presence of other heart-valve replacement: Secondary | ICD-10-CM | POA: Diagnosis not present

## 2017-02-09 DIAGNOSIS — Z7901 Long term (current) use of anticoagulants: Secondary | ICD-10-CM | POA: Diagnosis not present

## 2017-02-09 DIAGNOSIS — Z952 Presence of prosthetic heart valve: Secondary | ICD-10-CM

## 2017-02-09 DIAGNOSIS — Z8679 Personal history of other diseases of the circulatory system: Secondary | ICD-10-CM | POA: Diagnosis not present

## 2017-02-09 LAB — POCT INR: INR: 4.2

## 2017-02-11 ENCOUNTER — Encounter (HOSPITAL_COMMUNITY)
Admission: RE | Admit: 2017-02-11 | Discharge: 2017-02-11 | Disposition: A | Discharge status: Home / Self Care | Payer: BLUE CROSS/BLUE SHIELD | Source: Ambulatory Visit | Attending: Cardiology | Admitting: Cardiology

## 2017-02-11 DIAGNOSIS — Z8679 Personal history of other diseases of the circulatory system: Secondary | ICD-10-CM | POA: Diagnosis not present

## 2017-02-11 DIAGNOSIS — Z950 Presence of cardiac pacemaker: Secondary | ICD-10-CM | POA: Diagnosis not present

## 2017-02-11 DIAGNOSIS — Z952 Presence of prosthetic heart valve: Secondary | ICD-10-CM | POA: Diagnosis not present

## 2017-02-11 DIAGNOSIS — Z48812 Encounter for surgical aftercare following surgery on the circulatory system: Secondary | ICD-10-CM | POA: Diagnosis not present

## 2017-02-11 DIAGNOSIS — Z954 Presence of other heart-valve replacement: Secondary | ICD-10-CM | POA: Diagnosis not present

## 2017-02-11 DIAGNOSIS — Z9889 Other specified postprocedural states: Secondary | ICD-10-CM | POA: Diagnosis not present

## 2017-02-14 ENCOUNTER — Encounter (HOSPITAL_COMMUNITY)
Admission: RE | Admit: 2017-02-14 | Discharge: 2017-02-14 | Disposition: A | Discharge status: Home / Self Care | Payer: BLUE CROSS/BLUE SHIELD | Source: Ambulatory Visit | Attending: Cardiology | Admitting: Cardiology

## 2017-02-14 DIAGNOSIS — Z954 Presence of other heart-valve replacement: Secondary | ICD-10-CM | POA: Diagnosis not present

## 2017-02-14 DIAGNOSIS — Z8679 Personal history of other diseases of the circulatory system: Secondary | ICD-10-CM | POA: Diagnosis not present

## 2017-02-14 DIAGNOSIS — Z9889 Other specified postprocedural states: Secondary | ICD-10-CM | POA: Diagnosis not present

## 2017-02-14 DIAGNOSIS — Z950 Presence of cardiac pacemaker: Secondary | ICD-10-CM | POA: Diagnosis not present

## 2017-02-14 DIAGNOSIS — Z952 Presence of prosthetic heart valve: Secondary | ICD-10-CM

## 2017-02-14 DIAGNOSIS — Z48812 Encounter for surgical aftercare following surgery on the circulatory system: Secondary | ICD-10-CM | POA: Diagnosis not present

## 2017-02-15 ENCOUNTER — Encounter: Payer: Self-pay | Admitting: Thoracic Surgery (Cardiothoracic Vascular Surgery)

## 2017-02-16 ENCOUNTER — Ambulatory Visit (INDEPENDENT_AMBULATORY_CARE_PROVIDER_SITE_OTHER): Payer: BLUE CROSS/BLUE SHIELD | Admitting: Pharmacist Clinician (PhC)/ Clinical Pharmacy Specialist

## 2017-02-16 ENCOUNTER — Encounter (HOSPITAL_COMMUNITY)
Admission: RE | Admit: 2017-02-16 | Discharge: 2017-02-16 | Disposition: A | Discharge status: Home / Self Care | Payer: BLUE CROSS/BLUE SHIELD | Source: Ambulatory Visit | Attending: Cardiology | Admitting: Cardiology

## 2017-02-16 DIAGNOSIS — Z952 Presence of prosthetic heart valve: Secondary | ICD-10-CM

## 2017-02-16 DIAGNOSIS — Z7901 Long term (current) use of anticoagulants: Secondary | ICD-10-CM | POA: Diagnosis not present

## 2017-02-16 DIAGNOSIS — Z950 Presence of cardiac pacemaker: Secondary | ICD-10-CM

## 2017-02-16 DIAGNOSIS — Z954 Presence of other heart-valve replacement: Secondary | ICD-10-CM | POA: Diagnosis not present

## 2017-02-16 DIAGNOSIS — Z8679 Personal history of other diseases of the circulatory system: Secondary | ICD-10-CM | POA: Diagnosis not present

## 2017-02-16 DIAGNOSIS — Z48812 Encounter for surgical aftercare following surgery on the circulatory system: Secondary | ICD-10-CM | POA: Diagnosis not present

## 2017-02-16 DIAGNOSIS — Z9889 Other specified postprocedural states: Secondary | ICD-10-CM | POA: Diagnosis not present

## 2017-02-16 LAB — POCT INR: INR: 3.3

## 2017-02-18 ENCOUNTER — Encounter (HOSPITAL_COMMUNITY): Admission: RE | Admit: 2017-02-18 | Payer: BLUE CROSS/BLUE SHIELD | Source: Ambulatory Visit

## 2017-02-21 ENCOUNTER — Ambulatory Visit (INDEPENDENT_AMBULATORY_CARE_PROVIDER_SITE_OTHER): Payer: BLUE CROSS/BLUE SHIELD | Admitting: Thoracic Surgery (Cardiothoracic Vascular Surgery)

## 2017-02-21 ENCOUNTER — Encounter: Payer: Self-pay | Admitting: Thoracic Surgery (Cardiothoracic Vascular Surgery)

## 2017-02-21 ENCOUNTER — Encounter (HOSPITAL_COMMUNITY)
Admission: RE | Admit: 2017-02-21 | Discharge: 2017-02-21 | Disposition: A | Discharge status: Home / Self Care | Payer: BLUE CROSS/BLUE SHIELD | Source: Ambulatory Visit | Attending: Cardiology | Admitting: Cardiology

## 2017-02-21 VITALS — BP 115/67 | HR 54 | Resp 20 | Ht 70.0 in | Wt 174.0 lb

## 2017-02-21 DIAGNOSIS — Z952 Presence of prosthetic heart valve: Secondary | ICD-10-CM

## 2017-02-21 DIAGNOSIS — Z950 Presence of cardiac pacemaker: Secondary | ICD-10-CM

## 2017-02-21 DIAGNOSIS — I071 Rheumatic tricuspid insufficiency: Secondary | ICD-10-CM | POA: Diagnosis not present

## 2017-02-21 DIAGNOSIS — Z954 Presence of other heart-valve replacement: Secondary | ICD-10-CM | POA: Diagnosis not present

## 2017-02-21 DIAGNOSIS — Z9889 Other specified postprocedural states: Secondary | ICD-10-CM

## 2017-02-21 DIAGNOSIS — Z8679 Personal history of other diseases of the circulatory system: Secondary | ICD-10-CM | POA: Diagnosis not present

## 2017-02-21 DIAGNOSIS — Z48812 Encounter for surgical aftercare following surgery on the circulatory system: Secondary | ICD-10-CM | POA: Diagnosis not present

## 2017-02-21 DIAGNOSIS — I340 Nonrheumatic mitral (valve) insufficiency: Secondary | ICD-10-CM

## 2017-02-21 NOTE — Patient Instructions (Signed)

## 2017-02-21 NOTE — Progress Notes (Signed)
[address]       [name] [address]             [phone]     CARDIOTHORACIC SURGERY OFFICE NOTE  Referring Provider is [place], Peter M, MD PCP is Lupe Carney, MD   HPI:  Patient is a 61 year old male with rheumatic heart disease who returns to the office today for routine follow-up status post minimally invasive mitral valve replacement using a mechanical prosthetic valve, tricuspid valve repair, and Maze procedure on 11/11/2016 for severe mitral regurgitation, tricuspid regurgitation, recurrent persistent atrial fibrillation, and dilated nonischemic cardiomyopathy.  Postoperative patient developed AV block and symptomatic bradycardia for which he ultimately underwent biventricular pacemaker implantation on 11/18/2016. Echocardiogram performed just prior to pacemaker implantation revealed left ventricular ejection fraction estimated 40-45% with normal functioning mechanical mitral valve prosthesis.  Since hospital discharge the patient has done very well. He was seen in follow-up by Dr. Sw[place]iland on 01/18/2017. At that time plans were made to reassess the patient's rhythm by interrogating his pacemaker later this month and consideration of possible DC cardioversion if the patient remain in atrial fibrillation. Clinically the patient has done quite well and he returns to our office for follow-up. He states that he feels very good. His breathing is much better than it was prior to surgery. He no longer has any significant soreness in his chest. He is fairly active physically and walking every day. Appetite is good. He is sleeping well at night. He has not been having much shortness of breath. Coumadin therapy has been fairly stable.  Current Outpatient Prescriptions  Medication Sig Dispense Refill  . acetaminophen (TYLENOL) 500 MG tablet Take 1,000 mg by mouth every 6 (six) hours as needed for moderate pain or headache.    Marland Kitchen amiodarone (PACERONE) 200 MG tablet Take 1  tablet (200 mg total) by mouth daily. 60 tablet 3  . aspirin EC 81 MG EC tablet Take 1 tablet (81 mg total) by mouth daily.    Marland Kitchen lisinopril (PRINIVIL,ZESTRIL) 5 MG tablet Take 1 tablet (5 mg total) by mouth daily. 90 tablet 3  . Multiple Vitamin (MULTIVITAMIN) tablet Take 1 tablet by mouth daily.    Marland Kitchen warfarin (COUMADIN) 5 MG tablet Take 1 tablet (5 mg total) by mouth daily at 6 PM. (Patient taking differently: Take 5 mg by mouth daily at 6 PM. OR AS DIRECTED Takes 5 mg Sun, Tuesday, Thursday, and Saturday.  Take 2.5 mg Monday, Wednesday, and Friday  May change) 30 tablet 3   No current facility-administered medications for this visit.       Physical Exam:   BP 115/67   Pulse (!) 54   Resp 20   Ht 5\' 10"  (1.778 m)   Wt 174 lb (78.9 kg)   SpO2 96% Comment: RA  BMI 24.97 kg/m   General:  Well-appearing  Chest:   Clear to auscultation  CV:   Regular rate and rhythm with mechanical heart valve sounds  Incisions:  Healing nicely  Abdomen:  Soft nontender  Extremities:  Warm and well-perfused, no lower extremity edema  Diagnostic Tests:  2 channel telemetry rhythm strip demonstrates what appears to be AV paced rhythm   Transthoracic Echocardiography  Patient:    [name], [name] MR #:       [id number] Study Date: 11/16/2016 Gender:     M Age:        30 Height:     177.8 cm Weight:  76.7 kg BSA:        1.95 m^2 Pt. Status: Room:       2W22C   ADMITTING    Tressie Stalker, M.D.  ATTENDING    Tressie Stalker, M.D.  SONOGRAPHER  Cathie Beams  ORDERING     Marca Ancona, M.D.  REFERRING    Marca Ancona, M.D.  PERFORMING   [facility], Inpatient  cc:  ------------------------------------------------------------------- LV EF: 40% -   45%  ------------------------------------------------------------------- History:   PMH:  Status post tricuspid valve repair. Status post mitral valve replacement. Non-ischemic cardiomyopathy. Cardiomyopathy - 425.9.  Atrial  fibrillation.  ------------------------------------------------------------------- Study Conclusions  - Left ventricle: The cavity size was normal. There was mild   concentric hypertrophy. Systolic function was mildly to   moderately reduced. The estimated ejection fraction was in the   range of 40% to 45%. Diffuse hypokinesis. - Ventricular septum: Septal motion showed moderate paradox. These   changes are consistent with intraventricular conduction delay. - Aortic valve: Trileaflet; moderately thickened, moderately   calcified leaflets. - Mitral valve: A mechanical prosthesis was present. - Pulmonary arteries: Systolic pressure could not be accurately   estimated. - Recommendations: Repeat limited study with doppler assessment of   the MV prosthesis.  Recommendations:  Repeat limited study with doppler assessment of the MV prosthesis.  ------------------------------------------------------------------- Study data:  Comparison was made to the study of 10/04/2016.  Study status:  Routine.  Procedure:  Transthoracic echocardiography. Image quality was adequate.  Study completion:  There were no complications.          Transthoracic echocardiography.  M-mode, complete 2D, spectral Doppler, and color Doppler.  Birthdate: Patient birthdate: Jun 08, 1956.  Age:  Patient is 61 yr old.  Sex: Gender: male.    BMI: 24.3 kg/m^2.  Blood pressure:     128/55 Patient status:  Inpatient.  Study date:  Study date: 11/16/2016. Study time: 10:44 AM.  Location:  Bedside.  -------------------------------------------------------------------  ------------------------------------------------------------------- Left ventricle:  The cavity size was normal. There was mild concentric hypertrophy. Systolic function was mildly to moderately reduced. The estimated ejection fraction was in the range of 40% to 45%. Diffuse  hypokinesis.  ------------------------------------------------------------------- Aortic valve:   Trileaflet; moderately thickened, moderately calcified leaflets. Mobility was not restricted.  Doppler: Transvalvular velocity was within the normal range. There was no stenosis. There was no regurgitation.  ------------------------------------------------------------------- Aorta:  Aortic root: The aortic root was normal in size.  ------------------------------------------------------------------- Mitral valve:  A mechanical prosthesis was present. Mobility was not restricted.  Doppler:  Transvalvular velocity was within the normal range. There was no evidence for stenosis.  Regurgitation could not be evaluated due to acoustic artifact from the prosthesis.  ------------------------------------------------------------------- Left atrium:  The atrium was normal in size.  ------------------------------------------------------------------- Right ventricle:  The cavity size was normal. Wall thickness was normal. Systolic function was normal.  ------------------------------------------------------------------- Ventricular septum:   Septal motion showed moderate paradox. These changes are consistent with intraventricular conduction delay.  ------------------------------------------------------------------- Pulmonic valve:    Structurally normal valve.   Cusp separation was normal.  Doppler:  Transvalvular velocity was within the normal range. There was no evidence for stenosis. There was no regurgitation.  ------------------------------------------------------------------- Tricuspid valve:   Structurally normal valve.    Doppler: Transvalvular velocity was within the normal range. There was trivial regurgitation.  ------------------------------------------------------------------- Pulmonary artery:   The main pulmonary artery was normal-sized. Systolic pressure could not be  accurately estimated.  ------------------------------------------------------------------- Right atrium:  The atrium was normal in size.  ------------------------------------------------------------------- Pericardium:  There was no pericardial effusion.  ------------------------------------------------------------------- Systemic veins: Inferior vena cava: The vessel was normal in size.  ------------------------------------------------------------------- Measurements   Left ventricle                           Value        Reference  LV ID, ED, PLAX chordal                  50.4  mm     43 - 52  LV ID, ES, PLAX chordal        (H)       40.7  mm     23 - 38  LV fx shortening, PLAX chordal (L)       19    %      >=29  LV PW thickness, ED                      15.1  mm     ----------  IVS/LV PW ratio, ED                      0.83         <=1.3    Ventricular septum                       Value        Reference  IVS thickness, ED                        12.6  mm     ----------    LVOT                                     Value        Reference  LVOT ID, S                               17    mm     ----------  LVOT area                                2.27  cm^2   ----------    Aorta                                    Value        Reference  Aortic root ID, ED                       33    mm     ----------    Left atrium                              Value        Reference  LA ID, A-P, ES                           51    mm     ----------  LA ID/bsa, A-P                 (  H)       2.61  cm/m^2 <=2.2    Right atrium                             Value        Reference  RA ID, S-I, ES, A4C            (H)       66.5  mm     34 - 49  RA area, ES, A4C               (H)       32.9  cm^2   8.3 - 19.5  RA volume, ES, A/L                       137   ml     ----------  RA volume/bsa, ES, A/L                   70.2  ml/m^2 ----------  Legend: (L)  and  (H)  mark values outside specified  reference range.  ------------------------------------------------------------------- Prepared and Electronically Authenticated by  Armanda Magic, MD 2018-01-09T14:32:24    Impression:  Patient is doing well approximately 3 months status post minimally invasive mitral valve replacement using a mechanical prosthetic valve, tricuspid valve repair, and Maze procedure. Based on that side rhythm strip the patient appears to be back in AV paced rhythm at this time. Clinically the patient looks very good.    Plan:  We have not recommended any changes to the patient's current medications. If interrogation of the patient's pacemaker confirms that he has been maintaining sinus rhythm and it might be reasonable to consider stopping amiodarone at some point. We will defer this decision to Dr. Elvis Coil discretion. I have encouraged the patient to continue to gradually increase his physical activity without any particular limitations at this time.  The patient has been reminded regarding the importance of dental hygiene and the lifelong need for antibiotic prophylaxis for all dental cleanings and other related invasive procedures.  The patient will continue to follow-up with Dr. [place]and. He will be followed by Dr. Ladona Ridgel in the pacemaker and atrial fibrillation clinic. He will return to our office for routine follow-up next January, approximately 1 year following his surgery for routine follow-up. He will call and return sooner should specific problems or questions arise.   I spent in excess of 15 minutes during the conduct of this office consultation and >50% of this time involved direct face-to-face encounter with the patient for counseling and/or coordination of their care.    Salvatore Decent. Cornelius Moras, MD 02/21/2017 4:21 PM

## 2017-02-22 ENCOUNTER — Other Ambulatory Visit: Payer: Self-pay

## 2017-02-22 ENCOUNTER — Other Ambulatory Visit: Payer: Self-pay | Admitting: Cardiology

## 2017-02-22 ENCOUNTER — Encounter: Payer: Self-pay | Admitting: Internal Medicine

## 2017-02-22 ENCOUNTER — Ambulatory Visit (HOSPITAL_COMMUNITY): Payer: BLUE CROSS/BLUE SHIELD | Attending: Cardiovascular Disease

## 2017-02-22 ENCOUNTER — Ambulatory Visit (INDEPENDENT_AMBULATORY_CARE_PROVIDER_SITE_OTHER): Payer: BLUE CROSS/BLUE SHIELD | Admitting: Internal Medicine

## 2017-02-22 DIAGNOSIS — I340 Nonrheumatic mitral (valve) insufficiency: Secondary | ICD-10-CM | POA: Diagnosis not present

## 2017-02-22 DIAGNOSIS — I4891 Unspecified atrial fibrillation: Secondary | ICD-10-CM | POA: Diagnosis not present

## 2017-02-22 DIAGNOSIS — I5022 Chronic systolic (congestive) heart failure: Secondary | ICD-10-CM

## 2017-02-22 DIAGNOSIS — Z954 Presence of other heart-valve replacement: Secondary | ICD-10-CM | POA: Insufficient documentation

## 2017-02-22 DIAGNOSIS — I428 Other cardiomyopathies: Secondary | ICD-10-CM

## 2017-02-22 DIAGNOSIS — I429 Cardiomyopathy, unspecified: Secondary | ICD-10-CM | POA: Insufficient documentation

## 2017-02-22 LAB — CUP PACEART INCLINIC DEVICE CHECK
Battery Voltage: 3.09 V
Brady Statistic AP VP Percent: 78.52 %
Brady Statistic AS VP Percent: 21.02 %
Brady Statistic AS VS Percent: 0.35 %
Brady Statistic RV Percent Paced: 92.58 %
Date Time Interrogation Session: 20180417105913
Implantable Lead Implant Date: 20180111
Implantable Lead Implant Date: 20180111
Implantable Lead Location: 753858
Implantable Lead Location: 753859
Implantable Lead Model: 4398
Implantable Lead Model: 5076
Lead Channel Impedance Value: 380 Ohm
Lead Channel Impedance Value: 437 Ohm
Lead Channel Impedance Value: 475 Ohm
Lead Channel Impedance Value: 551 Ohm
Lead Channel Impedance Value: 646 Ohm
Lead Channel Pacing Threshold Amplitude: 0.875 V
Lead Channel Pacing Threshold Amplitude: 1.25 V
Lead Channel Pacing Threshold Pulse Width: 0.4 ms
Lead Channel Pacing Threshold Pulse Width: 0.4 ms
Lead Channel Sensing Intrinsic Amplitude: 2.75 mV
Lead Channel Sensing Intrinsic Amplitude: 5.625 mV
Lead Channel Setting Pacing Amplitude: 2 V
Lead Channel Setting Pacing Amplitude: 2.25 V
Lead Channel Setting Pacing Amplitude: 2.5 V
Lead Channel Setting Pacing Pulse Width: 0.4 ms
Lead Channel Setting Pacing Pulse Width: 1 ms
MDC IDC LEAD IMPLANT DT: 20180111
MDC IDC LEAD LOCATION: 753860
MDC IDC MSMT BATTERY REMAINING LONGEVITY: 89 mo
MDC IDC MSMT LEADCHNL LV IMPEDANCE VALUE: 513 Ohm
MDC IDC MSMT LEADCHNL LV IMPEDANCE VALUE: 912 Ohm
MDC IDC MSMT LEADCHNL LV PACING THRESHOLD PULSEWIDTH: 1 ms
MDC IDC MSMT LEADCHNL RA IMPEDANCE VALUE: 323 Ohm
MDC IDC MSMT LEADCHNL RA IMPEDANCE VALUE: 475 Ohm
MDC IDC MSMT LEADCHNL RA SENSING INTR AMPL: 2.875 mV
MDC IDC MSMT LEADCHNL RV PACING THRESHOLD AMPLITUDE: 0.5 V
MDC IDC MSMT LEADCHNL RV SENSING INTR AMPL: 4.75 mV
MDC IDC PG IMPLANT DT: 20180111
MDC IDC SET LEADCHNL RV SENSING SENSITIVITY: 2 mV
MDC IDC STAT BRADY AP VS PERCENT: 0.08 %
MDC IDC STAT BRADY RA PERCENT PACED: 26.95 %

## 2017-02-22 LAB — ECHOCARDIOGRAM LIMITED
Height: 70 in
WEIGHTICAEL: 2905.6 [oz_av]

## 2017-02-22 NOTE — Patient Instructions (Signed)
Your physician recommends that you continue on your current medications as directed. Please refer to the Current Medication list given to you today.  Remote monitoring is used to monitor your Pacemaker of ICD from home. This monitoring reduces the number of office visits required to check your device to one time per year. It allows us to keep an eye on the functioning of your device to ensure it is working properly. You are scheduled for a device check from home on 05/24/17. You may send your transmission at any time that day. If you have a wireless device, the transmission will be sent automatically. After your physician reviews your transmission, you will receive a postcard with your next transmission date.  Your physician wants you to follow-up in: 9 months with Dr. Taylor.  You will receive a reminder letter in the mail two months in advance. If you don't receive a letter, please call our office to schedule the follow-up appointment.  
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
## 2017-02-23 ENCOUNTER — Ambulatory Visit (INDEPENDENT_AMBULATORY_CARE_PROVIDER_SITE_OTHER): Payer: BLUE CROSS/BLUE SHIELD | Admitting: Pharmacist

## 2017-02-23 ENCOUNTER — Encounter (HOSPITAL_COMMUNITY)
Admission: RE | Admit: 2017-02-23 | Discharge: 2017-02-23 | Disposition: A | Discharge status: Home / Self Care | Payer: BLUE CROSS/BLUE SHIELD | Source: Ambulatory Visit | Attending: Cardiology | Admitting: Cardiology

## 2017-02-23 DIAGNOSIS — Z950 Presence of cardiac pacemaker: Secondary | ICD-10-CM | POA: Diagnosis not present

## 2017-02-23 DIAGNOSIS — Z9889 Other specified postprocedural states: Secondary | ICD-10-CM | POA: Diagnosis not present

## 2017-02-23 DIAGNOSIS — Z8679 Personal history of other diseases of the circulatory system: Secondary | ICD-10-CM | POA: Diagnosis not present

## 2017-02-23 DIAGNOSIS — Z954 Presence of other heart-valve replacement: Secondary | ICD-10-CM | POA: Diagnosis not present

## 2017-02-23 DIAGNOSIS — Z952 Presence of prosthetic heart valve: Secondary | ICD-10-CM | POA: Diagnosis not present

## 2017-02-23 DIAGNOSIS — Z48812 Encounter for surgical aftercare following surgery on the circulatory system: Secondary | ICD-10-CM | POA: Diagnosis not present

## 2017-02-23 DIAGNOSIS — Z7901 Long term (current) use of anticoagulants: Secondary | ICD-10-CM | POA: Diagnosis not present

## 2017-02-23 LAB — POCT INR: INR: 4.2

## 2017-02-24 ENCOUNTER — Telehealth: Payer: Self-pay | Admitting: Cardiology

## 2017-02-24 NOTE — Telephone Encounter (Signed)
He may return to work from my standpoint.  Dawna Jakes [place] MD, [facility]

## 2017-02-24 NOTE — Telephone Encounter (Signed)
Will forward to dr [place]iland to review and advise

## 2017-02-24 NOTE — Telephone Encounter (Signed)
Patient wife calling in regards Dr. Elvis Coil approval for patient to go back to work. Please call to discuss,thanks.

## 2017-02-25 ENCOUNTER — Encounter (HOSPITAL_COMMUNITY)
Admission: RE | Admit: 2017-02-25 | Discharge: 2017-02-25 | Disposition: A | Discharge status: Home / Self Care | Payer: BLUE CROSS/BLUE SHIELD | Source: Ambulatory Visit | Attending: Cardiology | Admitting: Cardiology

## 2017-02-25 DIAGNOSIS — Z952 Presence of prosthetic heart valve: Secondary | ICD-10-CM

## 2017-02-25 DIAGNOSIS — Z950 Presence of cardiac pacemaker: Secondary | ICD-10-CM | POA: Diagnosis not present

## 2017-02-25 DIAGNOSIS — Z954 Presence of other heart-valve replacement: Secondary | ICD-10-CM | POA: Diagnosis not present

## 2017-02-25 DIAGNOSIS — Z9889 Other specified postprocedural states: Secondary | ICD-10-CM

## 2017-02-25 DIAGNOSIS — Z8679 Personal history of other diseases of the circulatory system: Secondary | ICD-10-CM | POA: Diagnosis not present

## 2017-02-25 DIAGNOSIS — Z48812 Encounter for surgical aftercare following surgery on the circulatory system: Secondary | ICD-10-CM | POA: Diagnosis not present

## 2017-02-25 NOTE — Telephone Encounter (Signed)
Returned call to patient's wife Dr.Jordan advised husband may return to work.

## 2017-02-28 ENCOUNTER — Encounter (HOSPITAL_COMMUNITY): Payer: BLUE CROSS/BLUE SHIELD

## 2017-03-02 ENCOUNTER — Encounter (HOSPITAL_COMMUNITY): Payer: BLUE CROSS/BLUE SHIELD

## 2017-03-02 ENCOUNTER — Encounter (HOSPITAL_COMMUNITY): Payer: Self-pay | Admitting: Cardiac Rehabilitation

## 2017-03-02 DIAGNOSIS — Z9889 Other specified postprocedural states: Secondary | ICD-10-CM

## 2017-03-02 DIAGNOSIS — Z952 Presence of prosthetic heart valve: Secondary | ICD-10-CM

## 2017-03-02 NOTE — Progress Notes (Addendum)
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 PHASE II EXERCISE from 02/11/2017 in [facility] CARDIAC REHAB  Date  01/12/17  Instruction Review Code  2- meets goals/outcomes      Exercising on Your Own:  -Group instruction provided by verbal instruction, power point, and written materials to support subject.  Instructors discuss benefits of exercise, components of exercise, frequency and intensity of exercise, and end points for exercise.  Also discuss use of nitroglycerin and activating EMS.  Review options of places to exercise outside of rehab.  Review guidelines for sex with heart disease.   CARDIAC REHAB PHASE II EXERCISE from 02/11/2017 in [facility] CARDIAC  REHAB  Date  01/26/17  Educator  Rosine Door.MS RCEP  Instruction Review Code  2- meets goals/outcomes      Cardiac Drugs I:  -Group instruction provided by verbal instruction and written materials to support subject.  Instructor reviews cardiac drug classes: antiplatelets, anticoagulants, beta blockers, and statins.  Instructor discusses reasons, side effects, and lifestyle considerations for each drug class.   Cardiac Drugs II:  -Group instruction provided by verbal instruction and written materials to support subject.  Instructor reviews cardiac drug classes: angiotensin converting enzyme inhibitors (ACE-I), angiotensin II receptor blockers (ARBs), nitrates, and calcium channel blockers.  Instructor discusses reasons, side effects, and lifestyle considerations for each drug class.   CARDIAC REHAB PHASE II EXERCISE from 02/11/2017 in [facility] CARDIAC REHAB  Date  01/19/17  Educator  Annice Pih  Instruction Review Code  2- meets goals/outcomes      Anatomy and Physiology of the Circulatory System:  -Group instruction provided by verbal instruction, video, and written materials to support  subject.  Reviews functional anatomy of heart, how it relates to various diagnoses, and what role the heart plays in the overall system.   CARDIAC REHAB PHASE II EXERCISE from 02/11/2017 in [facility] CARDIAC REHAB  Date  02/09/17  Instruction Review Code  2- meets goals/outcomes      Knowledge Questionnaire Score:     Knowledge Questionnaire Score - 12/28/16 1124      Knowledge Questionnaire Score   Pre Score 20/24      Core Components/Risk Factors/Patient Goals at Admission:     Personal Goals and Risk Factors at Admission - 12/28/16 1246      Core Components/Risk Factors/Patient Goals on Admission   Increase Strength and Stamina Yes   Intervention Provide advice, education, support and counseling about physical activity/exercise needs.;Develop an  individualized exercise prescription for aerobic and resistive training based on initial evaluation findings, risk stratification, comorbidities and participant's personal goals.   Expected Outcomes Achievement of increased cardiorespiratory fitness and enhanced flexibility, muscular endurance and strength shown through measurements of functional capacity and personal statement of participant.      Core Components/Risk Factors/Patient Goals Review:      Goals and Risk Factor Review    Row Name 01/26/17 1228 03/01/17 0726           Core Components/Risk Factors/Patient Goals Review   Personal Goals Review Other Other      Review pt is eagerly participating in CR activities.  pt has resumed some yard work and is walking at home on non CR days. pt cautioned  for proper healing to follow exertional guidelines as directed avoiding overexertion until MD permission given.  understanding verbalized. ecommended  pt continues to eagerly participate in CR activities.  pt reports exertional activities are getting easier for him.  He feels positive about his goal progression.  pt instructed to continue activity restrictions under MD recommendations.        Expected Outcomes pt will continue CR attendance focusing on exercise guidelines to increase strength/stamina and foster healing. pt encouraged to attend nutrition and RF education offerings for overall well being.  pt will continue CR attendance focusing on exercise guidelines to increase strength/stamina and foster healing. pt encouraged to attend nutrition and RF education offerings for overall well being.          Core Components/Risk Factors/Patient Goals at Discharge (Final Review):      Goals and Risk Factor Review - 03/01/17 0726      Core Components/Risk Factors/Patient Goals Review   Personal Goals Review Other   Review pt continues to eagerly participate in CR activities.  pt reports exertional activities are getting easier for him.  He  feels positive about his goal progression.  pt instructed to continue activity restrictions under MD recommendations.     Expected Outcomes pt will continue CR attendance focusing on exercise guidelines to increase strength/stamina and foster healing. pt encouraged to attend nutrition and RF education offerings for overall well being.       ITP Comments:     ITP Comments    Row Name 12/28/16 0801           ITP Comments Medical Director- Dr. Armanda Magic, MD.          Comments: Pt is making expected progress toward personal goals after completing 24 sessions. Recommend continued exercise and life style modification education including  stress management and relaxation techniques to decrease cardiac risk profile.

## 2017-03-02 NOTE — Progress Notes (Signed)
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 written materials to support subject.  Reviews functional anatomy of heart, how it relates to various diagnoses, and what role the heart plays in the overall system.   CARDIAC REHAB PHASE II EXERCISE from 02/25/2017 in [facility] CARDIAC REHAB  Date  02/09/17  Instruction Review Code  2- meets goals/outcomes      Knowledge Questionnaire Score:   Core Components/Risk Factors/Patient Goals at Admission:   Core Components/Risk Factors/Patient Goals Review:      Goals and Risk Factor Review    Row Name 01/26/17 1228 03/01/17 0726           Core Components/Risk Factors/Patient Goals Review   Personal  Goals Review Other Other      Review pt is eagerly participating in CR activities.  pt has resumed some yard work and is walking at home on non CR days. pt cautioned  for proper healing to follow exertional guidelines as directed avoiding overexertion until MD permission given.  understanding verbalized. ecommended  pt continues to eagerly participate in CR activities.  pt reports exertional activities are getting easier for him.  He feels positive about his goal progression.  pt instructed to continue activity restrictions under MD recommendations.        Expected Outcomes pt will continue CR attendance focusing on exercise guidelines to increase strength/stamina and foster healing. pt encouraged to attend nutrition and RF education offerings for overall well being.  pt will continue CR attendance focusing on exercise guidelines to increase strength/stamina and foster healing. pt encouraged to attend nutrition and RF education offerings for overall well being.          Core Components/Risk Factors/Patient Goals at Discharge (Final Review):      Goals and Risk Factor Review - 03/01/17 0726      Core Components/Risk Factors/Patient Goals Review   Personal Goals Review Other   Review pt continues to eagerly participate in CR activities.  pt reports exertional activities are getting easier for him.  He feels positive about his goal progression.  pt instructed to continue activity restrictions under MD recommendations.     Expected Outcomes pt will continue CR attendance focusing on exercise guidelines to increase strength/stamina and foster healing. pt encouraged to attend nutrition and RF education offerings for overall well being.       ITP Comments:   Comments: Pt is making expected progress toward personal goals after completing 24sessions. Recommend continued exercise and life style modification education including  stress management and relaxation  techniques to decrease cardiac risk profile.

## 2017-03-04 ENCOUNTER — Encounter (HOSPITAL_COMMUNITY): Payer: BLUE CROSS/BLUE SHIELD
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
## 2017-03-07 ENCOUNTER — Encounter (HOSPITAL_COMMUNITY): Payer: BLUE CROSS/BLUE SHIELD

## 2017-03-08 ENCOUNTER — Ambulatory Visit (INDEPENDENT_AMBULATORY_CARE_PROVIDER_SITE_OTHER): Payer: BLUE CROSS/BLUE SHIELD | Admitting: Pharmacist Clinician (PhC)/ Clinical Pharmacy Specialist

## 2017-03-08 ENCOUNTER — Ambulatory Visit (INDEPENDENT_AMBULATORY_CARE_PROVIDER_SITE_OTHER): Payer: BLUE CROSS/BLUE SHIELD | Admitting: Cardiology

## 2017-03-08 VITALS — BP 130/66 | HR 72 | Ht 70.0 in | Wt 183.2 lb

## 2017-03-08 DIAGNOSIS — I5022 Chronic systolic (congestive) heart failure: Secondary | ICD-10-CM

## 2017-03-08 DIAGNOSIS — Z954 Presence of other heart-valve replacement: Secondary | ICD-10-CM | POA: Diagnosis not present

## 2017-03-08 DIAGNOSIS — I340 Nonrheumatic mitral (valve) insufficiency: Secondary | ICD-10-CM

## 2017-03-08 DIAGNOSIS — Z7901 Long term (current) use of anticoagulants: Secondary | ICD-10-CM

## 2017-03-08 DIAGNOSIS — I4891 Unspecified atrial fibrillation: Secondary | ICD-10-CM

## 2017-03-08 LAB — POCT INR: INR: 3.5

## 2017-03-08 MED ORDER — LISINOPRIL 10 MG PO TABS: 10.0000 mg | ORAL_TABLET | Freq: Every day | ORAL | 3 refills | Status: DC

## 2017-03-08 NOTE — Patient Instructions (Signed)
Stop taking amiodarone  Increase lisinopril to 10 mg daily  Continue your other therapy  I will see you in 4 months.

## 2017-03-09 ENCOUNTER — Encounter (HOSPITAL_COMMUNITY): Payer: BLUE CROSS/BLUE SHIELD

## 2017-03-11 ENCOUNTER — Encounter (HOSPITAL_COMMUNITY): Payer: BLUE CROSS/BLUE SHIELD

## 2017-03-14 ENCOUNTER — Encounter (HOSPITAL_COMMUNITY): Payer: BLUE CROSS/BLUE SHIELD

## 2017-03-16 ENCOUNTER — Encounter (HOSPITAL_COMMUNITY): Payer: BLUE CROSS/BLUE SHIELD

## 2017-03-18 ENCOUNTER — Other Ambulatory Visit: Payer: Self-pay | Admitting: Physician Assistant

## 2017-03-18 ENCOUNTER — Encounter (HOSPITAL_COMMUNITY): Payer: BLUE CROSS/BLUE SHIELD

## 2017-03-21 ENCOUNTER — Encounter (HOSPITAL_COMMUNITY): Payer: BLUE CROSS/BLUE SHIELD

## 2017-03-23 ENCOUNTER — Encounter (HOSPITAL_COMMUNITY): Payer: BLUE CROSS/BLUE SHIELD

## 2017-03-25 ENCOUNTER — Ambulatory Visit (INDEPENDENT_AMBULATORY_CARE_PROVIDER_SITE_OTHER): Payer: BLUE CROSS/BLUE SHIELD | Admitting: Pharmacist Clinician (PhC)/ Clinical Pharmacy Specialist

## 2017-03-25 ENCOUNTER — Encounter (HOSPITAL_COMMUNITY): Payer: Self-pay

## 2017-03-25 ENCOUNTER — Encounter (HOSPITAL_COMMUNITY)
Admission: RE | Admit: 2017-03-25 | Discharge: 2017-03-25 | Disposition: A | Discharge status: Home / Self Care | Payer: BLUE CROSS/BLUE SHIELD | Source: Ambulatory Visit | Attending: Cardiology | Admitting: Cardiology

## 2017-03-25 ENCOUNTER — Encounter (HOSPITAL_COMMUNITY): Payer: BLUE CROSS/BLUE SHIELD

## 2017-03-25 VITALS — Wt 181.9 lb

## 2017-03-25 DIAGNOSIS — Z7901 Long term (current) use of anticoagulants: Secondary | ICD-10-CM

## 2017-03-25 DIAGNOSIS — Z950 Presence of cardiac pacemaker: Secondary | ICD-10-CM

## 2017-03-25 DIAGNOSIS — Z9889 Other specified postprocedural states: Secondary | ICD-10-CM

## 2017-03-25 DIAGNOSIS — Z954 Presence of other heart-valve replacement: Secondary | ICD-10-CM | POA: Insufficient documentation

## 2017-03-25 DIAGNOSIS — Z48812 Encounter for surgical aftercare following surgery on the circulatory system: Secondary | ICD-10-CM | POA: Insufficient documentation

## 2017-03-25 DIAGNOSIS — Z8679 Personal history of other diseases of the circulatory system: Secondary | ICD-10-CM | POA: Insufficient documentation

## 2017-03-25 DIAGNOSIS — Z952 Presence of prosthetic heart valve: Secondary | ICD-10-CM

## 2017-03-25 LAB — POCT INR: INR: 2.1

## 2017-03-28 ENCOUNTER — Encounter (HOSPITAL_COMMUNITY): Payer: BLUE CROSS/BLUE SHIELD

## 2017-03-30 ENCOUNTER — Encounter (HOSPITAL_COMMUNITY): Payer: BLUE CROSS/BLUE SHIELD

## 2017-04-01 ENCOUNTER — Encounter (HOSPITAL_COMMUNITY): Payer: BLUE CROSS/BLUE SHIELD

## 2017-04-04 ENCOUNTER — Encounter (HOSPITAL_COMMUNITY): Payer: BLUE CROSS/BLUE SHIELD

## 2017-04-05 ENCOUNTER — Other Ambulatory Visit: Payer: Self-pay | Admitting: Physician Assistant

## 2017-04-05 NOTE — Progress Notes (Signed)
Discharge Summary  Patient Details  Name: [name] [name] MRN: [id number] Date of Birth: 1956-06-11 Referring Provider:     CARDIAC REHAB PHASE II ORIENTATION from 12/28/2016 in [place]  Referring Provider  Martinique, Peter, MD.       Number of Visits: 25   Reason for Discharge:  Patient independent in their exercise.  Pt unable to continue cardiac rehab group exercise sessions due to returning to work. Pt plans to exercise on his own.    Smoking History:  History  Smoking Status  . Former Smoker  . Packs/day: 1.00  . Years: 41.00  . Types: Cigarettes  . Quit date: 10/02/2016  Smokeless Tobacco  . Former Systems developer  . Types: Chew  . Quit date: 10/25/1997    Comment: RECENTLY QUIT SMOKING    Diagnosis:  S/P MVR (mitral valve replacement)  S/P TVR (tricuspid valve repair)  Pacemaker  ADL UCSD:   Initial Exercise Prescription:     Initial Exercise Prescription - 12/28/16 1200      Date of Initial Exercise RX and Referring Provider   Date 12/28/16   Referring Provider Martinique, Peter, MD.     Bike   Level 1.2   Minutes 10   METs 3.93     NuStep   Level 2   Minutes 10   METs 3     Track   Laps 11   Minutes 10   METs 2.92     Prescription Details   Frequency (times per week) 3   Duration Progress to 30 minutes of continuous aerobic without signs/symptoms of physical distress     Intensity   THRR 40-80% of Max Heartrate 64-128   Ratings of Perceived Exertion 11-13   Perceived Dyspnea 0-4     Progression   Progression Continue to progress workloads to maintain intensity without signs/symptoms of physical distress.     Resistance Training   Training Prescription Yes   Weight 3lbs   Reps 10-12      Discharge Exercise Prescription (Final Exercise Prescription Changes):     Exercise Prescription Changes - 03/01/17 1400      Response to Exercise   Blood Pressure (Admit) 142/78   Blood Pressure (Exercise) 162/78   Blood Pressure (Exit) 130/70   Heart Rate (Admit) 90 bpm   Heart Rate (Exercise) 117 bpm   Heart Rate (Exit) 88 bpm   Rating of Perceived Exertion (Exercise) 15   Symptoms none   Comments Pt was oriented to HIIT on 02/07/17   Duration Continue with 30 min of aerobic exercise without signs/symptoms of physical distress.   Intensity THRR unchanged     Progression   Progression Continue to progress workloads to maintain intensity without signs/symptoms of physical distress.   Average METs 6.6     Resistance Training   Training Prescription Yes   Weight 5lbs   Reps 10-15   Time 10 Minutes     Interval Training   Interval Training Yes   Equipment Treadmill;NuStep   Comments 30" on and 2' off     Treadmill   MPH 3.2   Grade 4  HIIT@ 15% for 2 min. total time and 4% for 8 min total time   Minutes 10   METs 6.1     NuStep   Level 6  HIIT: level 6 for 8 min and level 8 for 2 min   Minutes 10   METs 5.9     Rower   Level  4   Watts 92   Minutes 10   METs 8     Home Exercise Plan   Plans to continue exercise at Home (comment)  walking   Frequency Add 3 additional days to program exercise sessions.   Initial Home Exercises Provided 01/10/17      Functional Capacity:     6 Minute Walk    Row Name 12/28/16 1143 03/25/17 1517 03/25/17 1523     6 Minute Walk   Phase Initial Discharge  -   Distance 1400 feet 1643 feet  -   Distance % Change  - 17.36 %  -   Walk Time 6 minutes 6 minutes  -   # of Rest Breaks 0 0  -   MPH 2.65 3.11  -   METS 3.81 4.16  -   RPE 11 11  -   VO2 Peak 13.32 14.56  -   Symptoms No No  -   Resting HR 86 bpm 75 bpm  -   Resting BP 102/80 126/62  -   Max Ex. HR 102 bpm 89 bpm  -   Max Ex. BP 122/82 144/64  -   2 Minute Post BP 118/70  - 120/60      Psychological, QOL, Others - Outcomes: PHQ 2/9: Depression screen [facility] 2/9 03/25/2017 01/03/2017  Decreased Interest 0 0  Down, Depressed, Hopeless 0 0  PHQ - 2 Score 0 0    Quality of  Life:     Quality of Life - 03/25/17 1523      Quality of Life Scores   Health/Function Pre 26.93 %   Health/Function Post 27.17 %   Health/Function % Change 0.89 %   Socioeconomic Pre 25.19 %   Socioeconomic Post 26.86 %   Socioeconomic % Change  6.63 %   Psych/Spiritual Pre 29.29 %   Psych/Spiritual Post 30 %   Psych/Spiritual % Change 2.42 %   Family Pre 28.8 %   Family Post 28.5 %   Family % Change -1.04 %   GLOBAL Pre 27.27 %   GLOBAL Post 27.88 %   GLOBAL % Change 2.24 %      Personal Goals: Goals established at orientation with interventions provided to work toward goal.     Personal Goals and Risk Factors at Admission - 03/25/17 1527      Core Components/Risk Factors/Patient Goals on Admission   Personal Goal Pt has met personal and program goals of learning activity limitations and has returned to work.       Personal Goals Discharge:     Goals and Risk Factor Review    Row Name 01/26/17 1228 03/01/17 0726 03/25/17 1543         Core Components/Risk Factors/Patient Goals Review   Personal Goals Review Other Other Other     Review pt is eagerly participating in CR activities.  pt has resumed some yard work and is walking at home on non CR days. pt cautioned  for proper healing to follow exertional guidelines as directed avoiding overexertion until MD permission given.  understanding verbalized. ecommended  pt continues to eagerly participate in CR activities.  pt reports exertional activities are getting easier for him.  He feels positive about his goal progression.  pt instructed to continue activity restrictions under MD recommendations.   pt graduated from [place] program due to returning to work.  this was his primary goal for cardiac rehab.  he is grateful and relieved to be able  to meet this goal.       Expected Outcomes pt will continue CR attendance focusing on exercise guidelines to increase strength/stamina and foster healing. pt encouraged to attend  nutrition and RF education offerings for overall well being.  pt will continue CR attendance focusing on exercise guidelines to increase strength/stamina and foster healing. pt encouraged to attend nutrition and RF education offerings for overall well being.  pt will continue home exercise routine and lifestyle modifications to decrease CAD risk factors.         Nutrition & Weight - Outcomes:     Pre Biometrics - 12/28/16 0856      Pre Biometrics   Waist Circumference 36.5 inches   Hip Circumference 38.5 inches   Waist to Hip Ratio 0.95 %   Triceps Skinfold 13 mm   % Body Fat 23.7 %   Grip Strength 46 kg   Flexibility 14.75 in   Single Leg Stand 30 seconds         Post Biometrics - 03/25/17 1518       Post  Biometrics   Weight 181 lb 14.1 oz (82.5 kg)   Waist Circumference 37 inches   Hip Circumference 40 inches   Waist to Hip Ratio 0.92 %   Triceps Skinfold 13 mm   % Body Fat 24.5 %   Grip Strength 45 kg   Flexibility 15 in   Single Leg Stand 30 seconds      Nutrition:     Nutrition Therapy & Goals - 01/10/17 0954      Nutrition Therapy   Diet Therapeutic Lifestyle Changes     Personal Nutrition Goals   Nutrition Goal Pt to identify and limit food sources of saturated fat and trans fat.      Intervention Plan   Intervention Prescribe, educate and counsel regarding individualized specific dietary modifications aiming towards targeted core components such as weight, hypertension, lipid management, diabetes, heart failure and other comorbidities.   Expected Outcomes Short Term Goal: Understand basic principles of dietary content, such as calories, fat, sodium, cholesterol and nutrients.;Long Term Goal: Adherence to prescribed nutrition plan.      Nutrition Discharge:     Nutrition Assessments - 04/05/17 1018      MEDFICTS Scores   Pre Score 70   Post Score 39   Score Difference -31      Education Questionnaire Score:     Knowledge Questionnaire Score  - 03/25/17 1517      Knowledge Questionnaire Score   Post Score 23/24      Goals reviewed with patient; copy given to patient.

## 2017-04-06 ENCOUNTER — Encounter (HOSPITAL_COMMUNITY): Payer: BLUE CROSS/BLUE SHIELD

## 2017-04-06 ENCOUNTER — Other Ambulatory Visit: Payer: Self-pay | Admitting: Thoracic Surgery (Cardiothoracic Vascular Surgery)

## 2017-04-06 ENCOUNTER — Other Ambulatory Visit: Payer: Self-pay | Admitting: Cardiology

## 2017-04-08 ENCOUNTER — Encounter (HOSPITAL_COMMUNITY): Payer: BLUE CROSS/BLUE SHIELD

## 2017-04-08 ENCOUNTER — Ambulatory Visit (INDEPENDENT_AMBULATORY_CARE_PROVIDER_SITE_OTHER): Payer: BLUE CROSS/BLUE SHIELD | Admitting: Pharmacist Clinician (PhC)/ Clinical Pharmacy Specialist

## 2017-04-08 DIAGNOSIS — Z7901 Long term (current) use of anticoagulants: Secondary | ICD-10-CM

## 2017-04-08 LAB — POCT INR: INR: 3

## 2017-04-11 ENCOUNTER — Encounter (HOSPITAL_COMMUNITY): Payer: BLUE CROSS/BLUE SHIELD

## 2017-04-11 NOTE — Addendum Note (Signed)
Addendum  created 04/11/17 0952 by Analina Filla, MD   Sign clinical note    

## 2017-04-13 ENCOUNTER — Encounter (HOSPITAL_COMMUNITY): Payer: BLUE CROSS/BLUE SHIELD

## 2017-04-15 ENCOUNTER — Encounter (HOSPITAL_COMMUNITY): Payer: BLUE CROSS/BLUE SHIELD

## 2017-04-18 ENCOUNTER — Encounter (HOSPITAL_COMMUNITY): Payer: BLUE CROSS/BLUE SHIELD

## 2017-04-20 ENCOUNTER — Encounter (HOSPITAL_COMMUNITY): Payer: BLUE CROSS/BLUE SHIELD

## 2017-04-22 ENCOUNTER — Encounter (HOSPITAL_COMMUNITY): Payer: BLUE CROSS/BLUE SHIELD

## 2017-04-25 ENCOUNTER — Encounter (HOSPITAL_COMMUNITY): Payer: BLUE CROSS/BLUE SHIELD

## 2017-04-27 ENCOUNTER — Encounter (HOSPITAL_COMMUNITY): Payer: BLUE CROSS/BLUE SHIELD

## 2017-04-29 ENCOUNTER — Encounter (HOSPITAL_COMMUNITY): Payer: BLUE CROSS/BLUE SHIELD

## 2017-04-29 ENCOUNTER — Ambulatory Visit (INDEPENDENT_AMBULATORY_CARE_PROVIDER_SITE_OTHER): Payer: BLUE CROSS/BLUE SHIELD | Admitting: Pharmacist Clinician (PhC)/ Clinical Pharmacy Specialist

## 2017-04-29 DIAGNOSIS — Z7901 Long term (current) use of anticoagulants: Secondary | ICD-10-CM | POA: Diagnosis not present

## 2017-04-29 LAB — POCT INR: INR: 2.2

## 2017-05-02 ENCOUNTER — Encounter (HOSPITAL_COMMUNITY): Payer: BLUE CROSS/BLUE SHIELD

## 2017-05-18 ENCOUNTER — Ambulatory Visit (INDEPENDENT_AMBULATORY_CARE_PROVIDER_SITE_OTHER): Payer: BLUE CROSS/BLUE SHIELD | Admitting: Pharmacist

## 2017-05-18 DIAGNOSIS — Z7901 Long term (current) use of anticoagulants: Secondary | ICD-10-CM

## 2017-05-18 LAB — POCT INR: INR: 1.9

## 2017-05-23 ENCOUNTER — Telehealth: Payer: Self-pay | Admitting: Cardiology

## 2017-05-23 NOTE — Telephone Encounter (Signed)
New Message   pt wife verbalized that she is calling for rn   She has some questions to ask about his medication she seen something on tv

## 2017-05-23 NOTE — Telephone Encounter (Signed)
Returned call. Pt not taking valsartan. He has warfarin by same manufacturer affected by valsartan product recall. Discussed w pharmD - warfarin not affected by recall but patient welcome to verify w patient's pharmacy. I communicated this to patient's wife. She voiced understanding and thanks for call.

## 2017-05-24 ENCOUNTER — Ambulatory Visit (INDEPENDENT_AMBULATORY_CARE_PROVIDER_SITE_OTHER): Payer: BLUE CROSS/BLUE SHIELD | Admitting: *Deleted

## 2017-05-24 ENCOUNTER — Telehealth: Payer: Self-pay | Admitting: Cardiology

## 2017-05-24 DIAGNOSIS — I428 Other cardiomyopathies: Secondary | ICD-10-CM | POA: Diagnosis not present

## 2017-05-24 DIAGNOSIS — I5022 Chronic systolic (congestive) heart failure: Secondary | ICD-10-CM

## 2017-05-24 NOTE — Telephone Encounter (Signed)
Confirmed remote transmission w/ pt wife.   

## 2017-05-25 ENCOUNTER — Encounter: Payer: Self-pay | Admitting: Cardiology

## 2017-05-25 NOTE — Progress Notes (Signed)
Remote pacemaker transmission.   

## 2017-05-26 LAB — CUP PACEART REMOTE DEVICE CHECK
Battery Remaining Longevity: 77 mo
Brady Statistic AS VS Percent: 0.26 %
Date Time Interrogation Session: 20180717215958
Implantable Lead Implant Date: 20180111
Implantable Lead Implant Date: 20180111
Implantable Lead Location: 753858
Implantable Lead Model: 5076
Implantable Pulse Generator Implant Date: 20180111
Lead Channel Impedance Value: 342 Ohm
Lead Channel Impedance Value: 456 Ohm
Lead Channel Pacing Threshold Amplitude: 0.875 V
Lead Channel Pacing Threshold Amplitude: 1.5 V
Lead Channel Pacing Threshold Pulse Width: 1 ms
Lead Channel Sensing Intrinsic Amplitude: 2.25 mV
Lead Channel Sensing Intrinsic Amplitude: 5.125 mV
Lead Channel Sensing Intrinsic Amplitude: 5.125 mV
Lead Channel Setting Sensing Sensitivity: 2 mV
MDC IDC LEAD IMPLANT DT: 20180111
MDC IDC LEAD LOCATION: 753859
MDC IDC LEAD LOCATION: 753860
MDC IDC MSMT BATTERY VOLTAGE: 3.02 V
MDC IDC MSMT LEADCHNL LV IMPEDANCE VALUE: 456 Ohm
MDC IDC MSMT LEADCHNL LV IMPEDANCE VALUE: 551 Ohm
MDC IDC MSMT LEADCHNL LV IMPEDANCE VALUE: 589 Ohm
MDC IDC MSMT LEADCHNL LV IMPEDANCE VALUE: 722 Ohm
MDC IDC MSMT LEADCHNL LV IMPEDANCE VALUE: 893 Ohm
MDC IDC MSMT LEADCHNL RA IMPEDANCE VALUE: 323 Ohm
MDC IDC MSMT LEADCHNL RA PACING THRESHOLD AMPLITUDE: 0.875 V
MDC IDC MSMT LEADCHNL RA PACING THRESHOLD PULSEWIDTH: 0.4 ms
MDC IDC MSMT LEADCHNL RA SENSING INTR AMPL: 2.25 mV
MDC IDC MSMT LEADCHNL RV IMPEDANCE VALUE: 494 Ohm
MDC IDC MSMT LEADCHNL RV PACING THRESHOLD PULSEWIDTH: 0.4 ms
MDC IDC SET LEADCHNL LV PACING AMPLITUDE: 2.75 V
MDC IDC SET LEADCHNL LV PACING PULSEWIDTH: 1 ms
MDC IDC SET LEADCHNL RA PACING AMPLITUDE: 2 V
MDC IDC SET LEADCHNL RV PACING AMPLITUDE: 2.5 V
MDC IDC SET LEADCHNL RV PACING PULSEWIDTH: 0.4 ms
MDC IDC STAT BRADY AP VP PERCENT: 48.92 %
MDC IDC STAT BRADY AP VS PERCENT: 0.03 %
MDC IDC STAT BRADY AS VP PERCENT: 50.79 %
MDC IDC STAT BRADY RA PERCENT PACED: 48.32 %
MDC IDC STAT BRADY RV PERCENT PACED: 99.64 %

## 2017-06-08 ENCOUNTER — Encounter: Payer: Self-pay | Admitting: Cardiology

## 2017-06-08 ENCOUNTER — Ambulatory Visit (INDEPENDENT_AMBULATORY_CARE_PROVIDER_SITE_OTHER): Payer: BLUE CROSS/BLUE SHIELD | Admitting: Pharmacist Clinician (PhC)/ Clinical Pharmacy Specialist

## 2017-06-08 DIAGNOSIS — Z7901 Long term (current) use of anticoagulants: Secondary | ICD-10-CM | POA: Diagnosis not present

## 2017-06-08 LAB — POCT INR: INR: 2.2

## 2017-06-22 ENCOUNTER — Ambulatory Visit (INDEPENDENT_AMBULATORY_CARE_PROVIDER_SITE_OTHER): Payer: BLUE CROSS/BLUE SHIELD | Admitting: Pharmacist Clinician (PhC)/ Clinical Pharmacy Specialist

## 2017-06-22 DIAGNOSIS — Z7901 Long term (current) use of anticoagulants: Secondary | ICD-10-CM

## 2017-06-22 LAB — POCT INR: INR: 2.9

## 2017-06-24 DIAGNOSIS — Z23 Encounter for immunization: Secondary | ICD-10-CM | POA: Diagnosis not present

## 2017-06-24 DIAGNOSIS — Z1159 Encounter for screening for other viral diseases: Secondary | ICD-10-CM | POA: Diagnosis not present

## 2017-06-24 DIAGNOSIS — Z125 Encounter for screening for malignant neoplasm of prostate: Secondary | ICD-10-CM | POA: Diagnosis not present

## 2017-06-24 DIAGNOSIS — I4891 Unspecified atrial fibrillation: Secondary | ICD-10-CM | POA: Diagnosis not present

## 2017-06-24 DIAGNOSIS — Z13220 Encounter for screening for lipoid disorders: Secondary | ICD-10-CM | POA: Diagnosis not present

## 2017-06-24 DIAGNOSIS — Z0000 Encounter for general adult medical examination without abnormal findings: Secondary | ICD-10-CM | POA: Diagnosis not present

## 2017-06-27 ENCOUNTER — Telehealth: Payer: Self-pay | Admitting: Cardiology

## 2017-06-27 MED ORDER — WARFARIN SODIUM 5 MG PO TABS
ORAL_TABLET | ORAL | 0 refills | Status: AC
Start: 2017-06-27 — End: ?

## 2017-06-27 NOTE — Telephone Encounter (Signed)
New message     *STAT* If patient is at the pharmacy, call can be transferred to refill team.   1. Which medications need to be refilled? (please list name of each medication and dose if known) warfarin (COUMADIN) 5 MG tablet  2. Which pharmacy/location (including street and city if local pharmacy) is medication to be sent to? [facility] on [address]  3. Do they need a 30 day or 90 day supply? 30 day supply

## 2017-07-02 ENCOUNTER — Other Ambulatory Visit: Payer: Self-pay | Admitting: Cardiology
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
## 2017-07-13 ENCOUNTER — Ambulatory Visit (INDEPENDENT_AMBULATORY_CARE_PROVIDER_SITE_OTHER): Payer: BLUE CROSS/BLUE SHIELD | Admitting: Pharmacist

## 2017-07-13 ENCOUNTER — Ambulatory Visit (INDEPENDENT_AMBULATORY_CARE_PROVIDER_SITE_OTHER): Payer: BLUE CROSS/BLUE SHIELD | Admitting: Cardiology

## 2017-07-13 ENCOUNTER — Encounter: Payer: Self-pay | Admitting: Cardiology

## 2017-07-13 VITALS — BP 100/58 | HR 80 | Ht 69.0 in | Wt 173.0 lb

## 2017-07-13 DIAGNOSIS — Z7901 Long term (current) use of anticoagulants: Secondary | ICD-10-CM

## 2017-07-13 DIAGNOSIS — I071 Rheumatic tricuspid insufficiency: Secondary | ICD-10-CM

## 2017-07-13 DIAGNOSIS — I340 Nonrheumatic mitral (valve) insufficiency: Secondary | ICD-10-CM | POA: Diagnosis not present

## 2017-07-13 DIAGNOSIS — I4891 Unspecified atrial fibrillation: Secondary | ICD-10-CM | POA: Diagnosis not present

## 2017-07-13 DIAGNOSIS — Z954 Presence of other heart-valve replacement: Secondary | ICD-10-CM

## 2017-07-13 DIAGNOSIS — I428 Other cardiomyopathies: Secondary | ICD-10-CM

## 2017-07-13 DIAGNOSIS — I5022 Chronic systolic (congestive) heart failure: Secondary | ICD-10-CM | POA: Diagnosis not present

## 2017-07-13 LAB — POCT INR: INR: 3.5

## 2017-07-13 NOTE — Patient Instructions (Addendum)
Continue your current therapy  I will see you in 6 months.   

## 2017-08-10 DIAGNOSIS — Z7901 Long term (current) use of anticoagulants: Secondary | ICD-10-CM | POA: Diagnosis not present

## 2017-08-23 ENCOUNTER — Ambulatory Visit (INDEPENDENT_AMBULATORY_CARE_PROVIDER_SITE_OTHER): Payer: BLUE CROSS/BLUE SHIELD | Admitting: *Deleted

## 2017-08-23 ENCOUNTER — Telehealth: Payer: Self-pay | Admitting: Cardiology

## 2017-08-23 DIAGNOSIS — I5022 Chronic systolic (congestive) heart failure: Secondary | ICD-10-CM

## 2017-08-23 DIAGNOSIS — I428 Other cardiomyopathies: Secondary | ICD-10-CM | POA: Diagnosis not present

## 2017-08-23 NOTE — Telephone Encounter (Signed)
Confirmed remote transmission w/ pt wife.   

## 2017-08-24 NOTE — Progress Notes (Signed)
Remote pacemaker transmission.   

## 2017-08-25 LAB — CUP PACEART REMOTE DEVICE CHECK
Battery Voltage: 3.01 V
Brady Statistic AP VP Percent: 41.7 %
Brady Statistic AP VS Percent: 0.05 %
Brady Statistic AS VP Percent: 57.93 %
Brady Statistic RA Percent Paced: 41.68 %
Brady Statistic RV Percent Paced: 99.57 %
Date Time Interrogation Session: 20181016221104
Implantable Lead Implant Date: 20180111
Implantable Lead Implant Date: 20180111
Implantable Lead Location: 753859
Implantable Lead Location: 753860
Implantable Lead Model: 4398
Implantable Lead Model: 5076
Lead Channel Impedance Value: 513 Ohm
Lead Channel Impedance Value: 608 Ohm
Lead Channel Impedance Value: 665 Ohm
Lead Channel Impedance Value: 817 Ohm
Lead Channel Impedance Value: 969 Ohm
Lead Channel Pacing Threshold Amplitude: 1 V
Lead Channel Pacing Threshold Pulse Width: 0.4 ms
Lead Channel Pacing Threshold Pulse Width: 1 ms
Lead Channel Sensing Intrinsic Amplitude: 2.625 mV
Lead Channel Sensing Intrinsic Amplitude: 5.375 mV
Lead Channel Setting Pacing Amplitude: 2 V
Lead Channel Setting Pacing Amplitude: 2 V
Lead Channel Setting Pacing Amplitude: 2.5 V
Lead Channel Setting Pacing Pulse Width: 0.4 ms
Lead Channel Setting Pacing Pulse Width: 1 ms
MDC IDC LEAD IMPLANT DT: 20180111
MDC IDC LEAD LOCATION: 753858
MDC IDC MSMT BATTERY REMAINING LONGEVITY: 96 mo
MDC IDC MSMT LEADCHNL LV IMPEDANCE VALUE: 475 Ohm
MDC IDC MSMT LEADCHNL LV PACING THRESHOLD AMPLITUDE: 1.5 V
MDC IDC MSMT LEADCHNL RA IMPEDANCE VALUE: 342 Ohm
MDC IDC MSMT LEADCHNL RA IMPEDANCE VALUE: 532 Ohm
MDC IDC MSMT LEADCHNL RA SENSING INTR AMPL: 2.625 mV
MDC IDC MSMT LEADCHNL RV IMPEDANCE VALUE: 342 Ohm
MDC IDC MSMT LEADCHNL RV PACING THRESHOLD AMPLITUDE: 1 V
MDC IDC MSMT LEADCHNL RV PACING THRESHOLD PULSEWIDTH: 0.4 ms
MDC IDC MSMT LEADCHNL RV SENSING INTR AMPL: 5.375 mV
MDC IDC PG IMPLANT DT: 20180111
MDC IDC SET LEADCHNL RV SENSING SENSITIVITY: 2 mV
MDC IDC STAT BRADY AS VS PERCENT: 0.31 %

## 2017-08-26 ENCOUNTER — Encounter: Payer: Self-pay | Admitting: Cardiology

## 2017-09-07 DIAGNOSIS — Z7901 Long term (current) use of anticoagulants: Secondary | ICD-10-CM | POA: Diagnosis not present

## 2017-09-07 DIAGNOSIS — Z23 Encounter for immunization: Secondary | ICD-10-CM | POA: Diagnosis not present

## 2017-10-05 DIAGNOSIS — Z7901 Long term (current) use of anticoagulants: Secondary | ICD-10-CM | POA: Diagnosis not present

## 2017-11-02 DIAGNOSIS — Z7901 Long term (current) use of anticoagulants: Secondary | ICD-10-CM | POA: Diagnosis not present

## 2017-11-16 DIAGNOSIS — Z7901 Long term (current) use of anticoagulants: Secondary | ICD-10-CM | POA: Diagnosis not present

## 2017-11-21 ENCOUNTER — Encounter: Payer: Self-pay | Admitting: Thoracic Surgery (Cardiothoracic Vascular Surgery)

## 2017-11-22 ENCOUNTER — Ambulatory Visit (INDEPENDENT_AMBULATORY_CARE_PROVIDER_SITE_OTHER): Payer: BLUE CROSS/BLUE SHIELD | Admitting: *Deleted

## 2017-11-22 ENCOUNTER — Telehealth: Payer: Self-pay | Admitting: Cardiology

## 2017-11-22 DIAGNOSIS — I428 Other cardiomyopathies: Secondary | ICD-10-CM | POA: Diagnosis not present

## 2017-11-22 NOTE — Telephone Encounter (Signed)
LMOVM reminding pt to send remote transmission.   

## 2017-11-24 ENCOUNTER — Other Ambulatory Visit: Payer: Self-pay | Admitting: *Deleted

## 2017-11-24 LAB — CUP PACEART REMOTE DEVICE CHECK
Battery Voltage: 3 V
Brady Statistic AS VP Percent: 45.49 %
Brady Statistic RA Percent Paced: 53.57 %
Implantable Lead Implant Date: 20180111
Implantable Lead Implant Date: 20180111
Implantable Lead Location: 753859
Implantable Lead Location: 753860
Implantable Lead Model: 4398
Implantable Lead Model: 5076
Implantable Lead Model: 5076
Implantable Pulse Generator Implant Date: 20180111
Lead Channel Impedance Value: 323 Ohm
Lead Channel Impedance Value: 342 Ohm
Lead Channel Impedance Value: 456 Ohm
Lead Channel Impedance Value: 551 Ohm
Lead Channel Impedance Value: 931 Ohm
Lead Channel Pacing Threshold Amplitude: 1.375 V
Lead Channel Pacing Threshold Pulse Width: 0.4 ms
Lead Channel Pacing Threshold Pulse Width: 0.4 ms
Lead Channel Pacing Threshold Pulse Width: 1 ms
Lead Channel Sensing Intrinsic Amplitude: 2.875 mV
Lead Channel Sensing Intrinsic Amplitude: 2.875 mV
Lead Channel Sensing Intrinsic Amplitude: 5.25 mV
Lead Channel Sensing Intrinsic Amplitude: 5.25 mV
Lead Channel Setting Pacing Amplitude: 2 V
Lead Channel Setting Pacing Amplitude: 2.5 V
Lead Channel Setting Pacing Pulse Width: 0.4 ms
Lead Channel Setting Sensing Sensitivity: 2 mV
MDC IDC LEAD IMPLANT DT: 20180111
MDC IDC LEAD LOCATION: 753858
MDC IDC MSMT BATTERY REMAINING LONGEVITY: 89 mo
MDC IDC MSMT LEADCHNL LV IMPEDANCE VALUE: 589 Ohm
MDC IDC MSMT LEADCHNL LV IMPEDANCE VALUE: 684 Ohm
MDC IDC MSMT LEADCHNL RA IMPEDANCE VALUE: 513 Ohm
MDC IDC MSMT LEADCHNL RA PACING THRESHOLD AMPLITUDE: 0.875 V
MDC IDC MSMT LEADCHNL RV IMPEDANCE VALUE: 399 Ohm
MDC IDC MSMT LEADCHNL RV PACING THRESHOLD AMPLITUDE: 0.625 V
MDC IDC SESS DTM: 20190116215315
MDC IDC SET LEADCHNL LV PACING PULSEWIDTH: 1 ms
MDC IDC SET LEADCHNL RA PACING AMPLITUDE: 2 V
MDC IDC STAT BRADY AP VP PERCENT: 53.9 %
MDC IDC STAT BRADY AP VS PERCENT: 0.06 %
MDC IDC STAT BRADY AS VS PERCENT: 0.55 %
MDC IDC STAT BRADY RV PERCENT PACED: 99.19 %

## 2017-11-24 MED ORDER — LISINOPRIL 10 MG PO TABS: 10.0000 mg | ORAL_TABLET | Freq: Every day | ORAL | 1 refills | Status: DC

## 2017-11-24 NOTE — Progress Notes (Signed)
Remote pacemaker transmission.   

## 2017-11-25 ENCOUNTER — Encounter: Payer: Self-pay | Admitting: Cardiology

## 2017-11-30 DIAGNOSIS — Z7901 Long term (current) use of anticoagulants: Secondary | ICD-10-CM | POA: Diagnosis not present

## 2017-12-12 ENCOUNTER — Encounter: Payer: BLUE CROSS/BLUE SHIELD | Admitting: Thoracic Surgery (Cardiothoracic Vascular Surgery)

## 2017-12-27 ENCOUNTER — Encounter: Payer: Self-pay | Admitting: Thoracic Surgery (Cardiothoracic Vascular Surgery)

## 2017-12-27 ENCOUNTER — Ambulatory Visit: Payer: BLUE CROSS/BLUE SHIELD | Admitting: Thoracic Surgery (Cardiothoracic Vascular Surgery)

## 2017-12-27 VITALS — BP 135/64 | HR 70 | Resp 20 | Ht 69.0 in | Wt 182.0 lb

## 2017-12-27 DIAGNOSIS — Z954 Presence of other heart-valve replacement: Secondary | ICD-10-CM

## 2017-12-27 DIAGNOSIS — Z8679 Personal history of other diseases of the circulatory system: Secondary | ICD-10-CM

## 2017-12-27 DIAGNOSIS — Z9889 Other specified postprocedural states: Secondary | ICD-10-CM | POA: Diagnosis not present

## 2017-12-27 NOTE — Patient Instructions (Signed)

## 2017-12-27 NOTE — Progress Notes (Signed)
[address]       [name] [address]             [phone]     CARDIOTHORACIC SURGERY OFFICE NOTE  Referring Provider is Swaziland, Peter M, MD PCP is Clovis Riley, L.August Saucer, MD   HPI:  Patient is a 62 year old male with rheumatic heart disease who returns to the office today for routine follow-up status post minimally invasive mitral valve replacement using a mechanical prosthetic valve, tricuspid valve repair, and Maze procedure on 11/11/2016 for severe mitral regurgitation, tricuspid regurgitation, recurrent persistent atrial fibrillation, and dilated nonischemic cardiomyopathy.  Postoperative patient developed AV block and symptomatic bradycardia for which he ultimately underwent biventricular pacemaker implantation on 11/18/2016.  His recovery was otherwise uneventful and he was last seen here in our office for follow-up on February 21, 2017 at which time he was doing very well.  He underwent routine follow-up echocardiogram at that time which revealed improved left ventricular systolic function with ejection fraction estimated 45-50%, normal functioning mechanical prosthetic valve in the mitral position, and intact tricuspid valve repair with no residual tricuspid regurgitation.  Clinically the patient has done remarkably well ever since.  He was last seen in follow-up by Dr. Swaziland on July 13, 2017.  Interrogation of his pacemaker at that time revealed that the patient was maintaining sinus rhythm with very low atrial fibrillation burden (only 1.2%).  He returns to our office today for routine follow-up more than 1 year postoperatively.  He reports that he is doing exceptionally well.  He states that he feels much improved in comparison with how he felt prior to surgery.  He enjoys normal exercise tolerance and states that he only gets short of breath with very strenuous exertion, such as running up 3 flights of stairs.  He has no chest pain or chest tightness.  He never has  any palpitations.  He has not had any problems with long-term warfarin anticoagulation.   Current Outpatient Medications  Medication Sig Dispense Refill  . acetaminophen (TYLENOL) 500 MG tablet Take 1,000 mg by mouth every 6 (six) hours as needed for moderate pain or headache.    Marland Kitchen aspirin EC 81 MG EC tablet Take 1 tablet (81 mg total) by mouth daily.    Marland Kitchen lisinopril (PRINIVIL,ZESTRIL) 10 MG tablet Take 1 tablet (10 mg total) by mouth daily. 90 tablet 1  . Multiple Vitamin (MULTIVITAMIN) tablet Take 1 tablet by mouth daily.    Marland Kitchen warfarin (COUMADIN) 5 MG tablet Take 1 to 1 and 1/2 tablets daily as directed by coumadin clinic 90 tablet 0   No current facility-administered medications for this visit.       Physical Exam:   BP 135/64   Pulse 70   Resp 20   Ht 5\' 9"  (1.753 m)   Wt 182 lb (82.6 kg)   SpO2 97% Comment: RA  BMI 26.88 kg/m   General:  Well-appearing  Chest:   Clear to auscultation  CV:   Regular rate and rhythm with mechanical heart valve sounds, no murmur appreciated  Incisions:  Completely healed  Abdomen:  Soft nontender  Extremities:  Warm and well perfused  Diagnostic Tests:  Transthoracic Echocardiography  Patient:    [name] [name], [name] [name] MR #:       [id number] Study Date: 02/22/2017 Gender:     M Age:        61 Height:     177.8 cm Weight:     82.4  kg BSA:        2.03 m^2 Pt. Status: Room:   ORDERING     Peter Swaziland, M.D.  REFERRING    Peter Swaziland, M.D.  SONOGRAPHER  [name] [name], RDCS  PERFORMING   [facility], Outpatient  ATTENDING    [name] Si, MD  cc:  ------------------------------------------------------------------- LV EF: 45% -   50%  ------------------------------------------------------------------- Indications:      I05.9 Mitral Valve Disorder.  LIMITED ECHO TO ASSESS MV PROSTHESIS.  ------------------------------------------------------------------- History:   PMH:  Acquired from the patient and from the  patient&'s chart.  PMH:  Atrial Fibrillation. Non-ischemic Cardiomyopathy. Rheumatic Heart Disease.  ------------------------------------------------------------------- Study Conclusions  - Left ventricle: The cavity size was mildly dilated. Systolic   function was mildly reduced. The estimated ejection fraction was   in the range of 45% to 50%. Hypokinesis of the anteroseptal and   inferoseptal myocardium. The study is not technically sufficient   to allow evaluation of LV diastolic function. - Aortic valve: Transvalvular velocity was within the normal range.   There was no stenosis. There was no regurgitation. - Mitral valve: A mechanical prosthesis was present and functioning   normally. The sewing ring appeared normal. Peak E-wave velocity:   149 cm/s. Mean gradient (D): 4 mm Hg. - Right ventricle: The cavity size was normal. Wall thickness was   normal. Systolic function was normal. - Tricuspid valve: There was no regurgitation.  ------------------------------------------------------------------- Labs, prior tests, procedures, and surgery: Status post Tricuspid Valve Repair- 28 mm Edwards Designer, television/film set. Status post Mitral Valve Replacement- 33mm Sorin Carbomedics Optiform bileaflet mechanical prosthetic valve. Permanent pacemaker system implantation.  ------------------------------------------------------------------- Study data:   Study status:  Routine.  Procedure:  The patient reported no pain pre or post test. Transthoracic echocardiography. Image quality was adequate.  Study completion:  There were no complications.          Transthoracic echocardiography.  M-mode, limited 2D, limited spectral Doppler, and color Doppler. Birthdate:  Patient birthdate: 22-Apr-1956.  Age:  Patient is 62 yr old.  Sex:  Gender: male.    BMI: 26.1 kg/m^2.  Blood pressure: 115/67  Patient status:  Outpatient.  Study date:  Study date: 02/22/2017. Study time: 09:53 AM.  Location:   [place] [facility]  -------------------------------------------------------------------  ------------------------------------------------------------------- Left ventricle:  The cavity size was mildly dilated. Systolic function was mildly reduced. The estimated ejection fraction was in the range of 45% to 50%.  Regional wall motion abnormalities: Hypokinesis of the anteroseptal and inferoseptal myocardium. The study is not technically sufficient to allow evaluation of LV diastolic function.  ------------------------------------------------------------------- Aortic valve:   Trileaflet; normal thickness leaflets. Mobility was not restricted.  Doppler:  Transvalvular velocity was within the normal range. There was no stenosis. There was no regurgitation.   ------------------------------------------------------------------- Aorta:  Aortic root: The aortic root was normal in size.  ------------------------------------------------------------------- Mitral valve:  A mechanical prosthesis was present and functioning normally. The sewing ring appeared normal. Mobility was not restricted.  Doppler:  Transvalvular velocity was within the normal range. There was no evidence for stenosis. There was trivial regurgitation.    Valve area by pressure half-time: 1.95 cm^2. Indexed valve area by pressure half-time: 0.96 cm^2/m^2.    Mean gradient (D): 4 mm Hg. Peak gradient (D): 9 mm Hg.  ------------------------------------------------------------------- Left atrium:  The atrium was normal in size.  ------------------------------------------------------------------- Right ventricle:  The cavity size was normal. Wall thickness was normal. Systolic function was normal.  ------------------------------------------------------------------- Pulmonic valve:  Structurally normal valve.   Cusp separation was normal.  Doppler:  Transvalvular velocity was within the normal range. There was no  evidence for stenosis. There was no regurgitation.  ------------------------------------------------------------------- Tricuspid valve:   Structurally normal valve.    Doppler: Transvalvular velocity was within the normal range. There was no regurgitation.  ------------------------------------------------------------------- Pulmonary artery:   The main pulmonary artery was normal-sized. Systolic pressure could not be accurately estimated.  ------------------------------------------------------------------- Right atrium:  The atrium was normal in size.  ------------------------------------------------------------------- Pericardium:  There was no pericardial effusion.  ------------------------------------------------------------------- Systemic veins: Inferior vena cava: The vessel was normal in size. The respirophasic diameter changes were blunted (< 50%), consistent with elevated central venous pressure.  ------------------------------------------------------------------- Measurements   Left ventricle                         Value          Reference  LV ID, ED, PLAX chordal        (H)     57.3  mm       43 - 52  LV ID, ES, PLAX chordal        (H)     43.3  mm       23 - 38  LV fx shortening, PLAX chordal (L)     24    %        >=29  LV PW thickness, ED                    10.2  mm       ---------  IVS/LV PW ratio, ED                    0.81           <=1.3    Ventricular septum                     Value          Reference  IVS thickness, ED                      8.24  mm       ---------    LVOT                                   Value          Reference  LVOT ID, S                             18    mm       ---------  LVOT area                              2.54  cm^2     ---------    Aorta                                  Value          Reference  Aortic root ID, ED                     33    mm       ---------  Left atrium                            Value           Reference  LA ID, A-P, ES                         53    mm       ---------  LA ID/bsa, A-P                 (H)     2.61  cm/m^2   <=2.2    Mitral valve                           Value          Reference  Mitral E-wave peak velocity            149   cm/s     ---------  Mitral A-wave peak velocity            55.5  cm/s     ---------  Mitral mean velocity, D                87.6  cm/s     ---------  Mitral deceleration time       (H)     314   ms       150 - 230  Mitral pressure half-time              108   ms       ---------  Mitral mean gradient, D                4     mm Hg    ---------  Mitral peak gradient, D                9     mm Hg    ---------  Mitral E/A ratio, peak                 2.6            ---------  Mitral valve area, PHT, DP             1.95  cm^2     ---------  Mitral valve area/bsa, PHT, DP         0.96  cm^2/m^2 ---------  Mitral annulus VTI, D                  35    cm       ---------    Systemic veins                         Value          Reference  Estimated CVP                          8     mm Hg    ---------  Legend: (L)  and  (H)  mark values outside specified reference range.  ------------------------------------------------------------------- Prepared and Electronically Authenticated by  [name] Si, MD 2018-04-17T12:04:00   2 channel telemetry rhythm strip demonstrates AV paced rhythm   Impression:  Patient is doing very well and maintaining stable AV paced rhythm more than 1 year status post minimally  invasive mitral valve replacement using a mechanical prosthetic valve, tricuspid valve repair, and Maze procedure.  Plan:  We have not recommended any changes to the patient's current medications.  The patient has been reminded regarding the importance of dental hygiene and the lifelong need for antibiotic prophylaxis for all dental cleanings and other related invasive procedures.  In the future he will return to our office only should  further problems or questions arise.  I spent in excess of 15 minutes during the conduct of this office consultation and >50% of this time involved direct face-to-face encounter with the patient for counseling and/or coordination of their care.    Salvatore Decent. Cornelius Moras, MD 12/27/2017 4:50 PM

## 2017-12-29 DIAGNOSIS — R972 Elevated prostate specific antigen [PSA]: Secondary | ICD-10-CM | POA: Diagnosis not present

## 2017-12-29 DIAGNOSIS — Z7901 Long term (current) use of anticoagulants: Secondary | ICD-10-CM | POA: Diagnosis not present

## 2018-01-05 DIAGNOSIS — Z7901 Long term (current) use of anticoagulants: Secondary | ICD-10-CM | POA: Diagnosis not present

## 2018-02-02 DIAGNOSIS — Z7901 Long term (current) use of anticoagulants: Secondary | ICD-10-CM | POA: Diagnosis not present

## 2018-02-16 DIAGNOSIS — Z7901 Long term (current) use of anticoagulants: Secondary | ICD-10-CM | POA: Diagnosis not present

## 2018-02-21 ENCOUNTER — Telehealth: Payer: Self-pay | Admitting: Cardiology

## 2018-02-21 ENCOUNTER — Ambulatory Visit: Payer: BLUE CROSS/BLUE SHIELD | Admitting: *Deleted

## 2018-02-21 NOTE — Telephone Encounter (Signed)
LMOVM reminding pt to send remote transmission.   

## 2018-02-23 ENCOUNTER — Encounter: Payer: Self-pay | Admitting: Cardiology

## 2018-02-27 ENCOUNTER — Ambulatory Visit (INDEPENDENT_AMBULATORY_CARE_PROVIDER_SITE_OTHER): Payer: BLUE CROSS/BLUE SHIELD | Admitting: *Deleted

## 2018-02-27 DIAGNOSIS — I428 Other cardiomyopathies: Secondary | ICD-10-CM

## 2018-02-27 DIAGNOSIS — I5022 Chronic systolic (congestive) heart failure: Secondary | ICD-10-CM

## 2018-02-27 NOTE — Progress Notes (Signed)
Remote pacemaker transmission.   

## 2018-02-28 ENCOUNTER — Encounter: Payer: Self-pay | Admitting: Cardiology

## 2018-02-28 LAB — CUP PACEART REMOTE DEVICE CHECK
Battery Remaining Longevity: 90 mo
Battery Voltage: 2.99 V
Brady Statistic AP VP Percent: 63.1 %
Brady Statistic AS VP Percent: 35.87 %
Brady Statistic AS VS Percent: 0.92 %
Brady Statistic RA Percent Paced: 63.16 %
Date Time Interrogation Session: 20190419225654
Implantable Lead Implant Date: 20180111
Implantable Lead Implant Date: 20180111
Implantable Lead Implant Date: 20180111
Implantable Lead Location: 753859
Implantable Lead Model: 4398
Implantable Lead Model: 5076
Lead Channel Impedance Value: 323 Ohm
Lead Channel Impedance Value: 342 Ohm
Lead Channel Impedance Value: 494 Ohm
Lead Channel Impedance Value: 627 Ohm
Lead Channel Impedance Value: 665 Ohm
Lead Channel Impedance Value: 817 Ohm
Lead Channel Pacing Threshold Amplitude: 0.75 V
Lead Channel Pacing Threshold Amplitude: 0.875 V
Lead Channel Pacing Threshold Amplitude: 1.5 V
Lead Channel Pacing Threshold Pulse Width: 0.4 ms
Lead Channel Pacing Threshold Pulse Width: 0.4 ms
Lead Channel Setting Pacing Amplitude: 2 V
Lead Channel Setting Pacing Amplitude: 2 V
Lead Channel Setting Pacing Amplitude: 2.5 V
Lead Channel Setting Pacing Pulse Width: 0.4 ms
Lead Channel Setting Pacing Pulse Width: 1 ms
MDC IDC LEAD LOCATION: 753858
MDC IDC LEAD LOCATION: 753860
MDC IDC MSMT LEADCHNL LV IMPEDANCE VALUE: 494 Ohm
MDC IDC MSMT LEADCHNL LV IMPEDANCE VALUE: 988 Ohm
MDC IDC MSMT LEADCHNL LV PACING THRESHOLD PULSEWIDTH: 1 ms
MDC IDC MSMT LEADCHNL RA IMPEDANCE VALUE: 513 Ohm
MDC IDC MSMT LEADCHNL RA SENSING INTR AMPL: 2.625 mV
MDC IDC MSMT LEADCHNL RA SENSING INTR AMPL: 2.625 mV
MDC IDC MSMT LEADCHNL RV SENSING INTR AMPL: 10.125 mV
MDC IDC MSMT LEADCHNL RV SENSING INTR AMPL: 10.125 mV
MDC IDC PG IMPLANT DT: 20180111
MDC IDC SET LEADCHNL RV SENSING SENSITIVITY: 2 mV
MDC IDC STAT BRADY AP VS PERCENT: 0.11 %
MDC IDC STAT BRADY RV PERCENT PACED: 98.7 %

## 2018-03-17 DIAGNOSIS — Z7901 Long term (current) use of anticoagulants: Secondary | ICD-10-CM | POA: Diagnosis not present

## 2018-04-14 DIAGNOSIS — Z7901 Long term (current) use of anticoagulants: Secondary | ICD-10-CM | POA: Diagnosis not present

## 2018-05-05 ENCOUNTER — Encounter: Payer: Self-pay | Admitting: Internal Medicine

## 2018-05-12 DIAGNOSIS — Z7901 Long term (current) use of anticoagulants: Secondary | ICD-10-CM | POA: Diagnosis not present

## 2018-05-16 ENCOUNTER — Encounter: Payer: Self-pay | Admitting: Internal Medicine

## 2018-05-16 DIAGNOSIS — R0989 Other specified symptoms and signs involving the circulatory and respiratory systems: Secondary | ICD-10-CM

## 2018-05-29 ENCOUNTER — Ambulatory Visit (INDEPENDENT_AMBULATORY_CARE_PROVIDER_SITE_OTHER): Payer: BLUE CROSS/BLUE SHIELD | Admitting: *Deleted

## 2018-05-29 DIAGNOSIS — I428 Other cardiomyopathies: Secondary | ICD-10-CM

## 2018-05-29 DIAGNOSIS — I5022 Chronic systolic (congestive) heart failure: Secondary | ICD-10-CM

## 2018-05-29 NOTE — Progress Notes (Signed)
Remote pacemaker transmission.   

## 2018-05-30 ENCOUNTER — Encounter: Payer: Self-pay | Admitting: Cardiology

## 2018-06-08 DIAGNOSIS — Z7901 Long term (current) use of anticoagulants: Secondary | ICD-10-CM | POA: Diagnosis not present

## 2018-06-22 LAB — CUP PACEART REMOTE DEVICE CHECK
Battery Remaining Longevity: 87 mo
Brady Statistic AP VP Percent: 66.1 %
Brady Statistic AS VS Percent: 0.78 %
Date Time Interrogation Session: 20190722043408
Implantable Lead Implant Date: 20180111
Implantable Lead Implant Date: 20180111
Implantable Lead Location: 753858
Lead Channel Impedance Value: 342 Ohm
Lead Channel Impedance Value: 532 Ohm
Lead Channel Impedance Value: 836 Ohm
Lead Channel Pacing Threshold Amplitude: 0.75 V
Lead Channel Pacing Threshold Amplitude: 1.25 V
Lead Channel Pacing Threshold Pulse Width: 1 ms
Lead Channel Sensing Intrinsic Amplitude: 6.625 mV
Lead Channel Sensing Intrinsic Amplitude: 6.625 mV
Lead Channel Setting Pacing Amplitude: 2 V
Lead Channel Setting Pacing Pulse Width: 1 ms
MDC IDC LEAD IMPLANT DT: 20180111
MDC IDC LEAD LOCATION: 753859
MDC IDC LEAD LOCATION: 753860
MDC IDC MSMT BATTERY VOLTAGE: 2.98 V
MDC IDC MSMT LEADCHNL LV IMPEDANCE VALUE: 1007 Ohm
MDC IDC MSMT LEADCHNL LV IMPEDANCE VALUE: 475 Ohm
MDC IDC MSMT LEADCHNL LV IMPEDANCE VALUE: 646 Ohm
MDC IDC MSMT LEADCHNL LV IMPEDANCE VALUE: 665 Ohm
MDC IDC MSMT LEADCHNL RA IMPEDANCE VALUE: 342 Ohm
MDC IDC MSMT LEADCHNL RA PACING THRESHOLD AMPLITUDE: 0.75 V
MDC IDC MSMT LEADCHNL RA PACING THRESHOLD PULSEWIDTH: 0.4 ms
MDC IDC MSMT LEADCHNL RA SENSING INTR AMPL: 2.5 mV
MDC IDC MSMT LEADCHNL RA SENSING INTR AMPL: 2.5 mV
MDC IDC MSMT LEADCHNL RV IMPEDANCE VALUE: 494 Ohm
MDC IDC MSMT LEADCHNL RV PACING THRESHOLD PULSEWIDTH: 0.4 ms
MDC IDC PG IMPLANT DT: 20180111
MDC IDC SET LEADCHNL RA PACING AMPLITUDE: 2 V
MDC IDC SET LEADCHNL RV PACING AMPLITUDE: 2.5 V
MDC IDC SET LEADCHNL RV PACING PULSEWIDTH: 0.4 ms
MDC IDC SET LEADCHNL RV SENSING SENSITIVITY: 2 mV
MDC IDC STAT BRADY AP VS PERCENT: 0.16 %
MDC IDC STAT BRADY AS VP PERCENT: 32.95 %
MDC IDC STAT BRADY RA PERCENT PACED: 66.06 %
MDC IDC STAT BRADY RV PERCENT PACED: 98.72 %

## 2018-07-06 DIAGNOSIS — Z7901 Long term (current) use of anticoagulants: Secondary | ICD-10-CM | POA: Diagnosis not present

## 2018-07-06 DIAGNOSIS — Z23 Encounter for immunization: Secondary | ICD-10-CM | POA: Diagnosis not present

## 2018-07-14 ENCOUNTER — Encounter: Payer: Self-pay | Admitting: Physician Assistant

## 2018-07-14 ENCOUNTER — Ambulatory Visit (INDEPENDENT_AMBULATORY_CARE_PROVIDER_SITE_OTHER): Payer: BLUE CROSS/BLUE SHIELD | Admitting: Physician Assistant

## 2018-07-14 VITALS — BP 136/70 | HR 70 | Ht 69.0 in | Wt 177.0 lb

## 2018-07-14 DIAGNOSIS — I428 Other cardiomyopathies: Secondary | ICD-10-CM

## 2018-07-14 DIAGNOSIS — Z952 Presence of prosthetic heart valve: Secondary | ICD-10-CM

## 2018-07-14 DIAGNOSIS — Z950 Presence of cardiac pacemaker: Secondary | ICD-10-CM | POA: Diagnosis not present

## 2018-07-14 DIAGNOSIS — I480 Paroxysmal atrial fibrillation: Secondary | ICD-10-CM | POA: Diagnosis not present

## 2018-07-14 DIAGNOSIS — I4891 Unspecified atrial fibrillation: Secondary | ICD-10-CM | POA: Diagnosis not present

## 2018-07-14 NOTE — Patient Instructions (Addendum)
Medication Instructions:   Your physician recommends that you continue on your current medications as directed. Please refer to the Current Medication list given to you today.   If you need a refill on your cardiac medications before your next appointment, please call your pharmacy.  Labwork: NONE ORDERED  TODAY   Testing/Procedures:  Your physician has requested that you have an echocardiogram. Echocardiography is a painless test that uses sound waves to create images of your heart. It provides your doctor with information about the size and shape of your heart and how well your heart's chambers and valves are working. This procedure takes approximately one hour. There are no restrictions for this procedure.    Follow-Up:  DR Swaz[place]and  ASAP    Your physician wants you to follow-up in: ONE YEAR WITH  Ladona Ridgel  You will receive a reminder letter in the mail two months in advance. If you don't receive a letter, please call our office to schedule the follow-up appointment.    Remote monitoring is used to monitor your Pacemaker of ICD from home. This monitoring reduces the number of office visits required to check your device to one time per year. It allows [place] to keep an eye on the functioning of your device to ensure it is working properly. You are scheduled for a device check from home on . 08-28-18  You may send your transmission at any time that day. If you have a wireless device, the transmission will be sent automatically. After your physician reviews your transmission, you will receive a postcard with your next transmission date.     Any Other Special Instructions Will Be Listed Below (If Applicable).
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
## 2018-07-20 DIAGNOSIS — Z7901 Long term (current) use of anticoagulants: Secondary | ICD-10-CM | POA: Diagnosis not present

## 2018-07-21 ENCOUNTER — Ambulatory Visit (HOSPITAL_COMMUNITY): Payer: BLUE CROSS/BLUE SHIELD | Attending: Cardiology

## 2018-07-21 ENCOUNTER — Other Ambulatory Visit: Payer: Self-pay

## 2018-07-21 DIAGNOSIS — I509 Heart failure, unspecified: Secondary | ICD-10-CM | POA: Insufficient documentation

## 2018-07-21 DIAGNOSIS — I255 Ischemic cardiomyopathy: Secondary | ICD-10-CM | POA: Insufficient documentation

## 2018-07-21 DIAGNOSIS — I480 Paroxysmal atrial fibrillation: Secondary | ICD-10-CM

## 2018-07-21 DIAGNOSIS — Z952 Presence of prosthetic heart valve: Secondary | ICD-10-CM | POA: Insufficient documentation

## 2018-08-07 IMAGING — CR DG CHEST 2V
2 series · 2 of 2 positions shown · non-contrast
Comparison: None in PACs

ADDENDUM:
Review of a CT scan performed on November 02, 2016, on Mr. Gelin
but reported under a different medical record number, reveals no
abnormality in the area of clinical concern. Thus the CT scan that I
recommended to be done prior to the anticipated surgical procedure
is not necessary. I apologize for the confusion that this has
caused.
CLINICAL DATA: Preoperative examination prior mitral valve surgery.
Discontinued smoking 1 month ago. Atrial fibrillation, CHF.

EXAM:
CHEST  2 VIEW

[w chest pa]
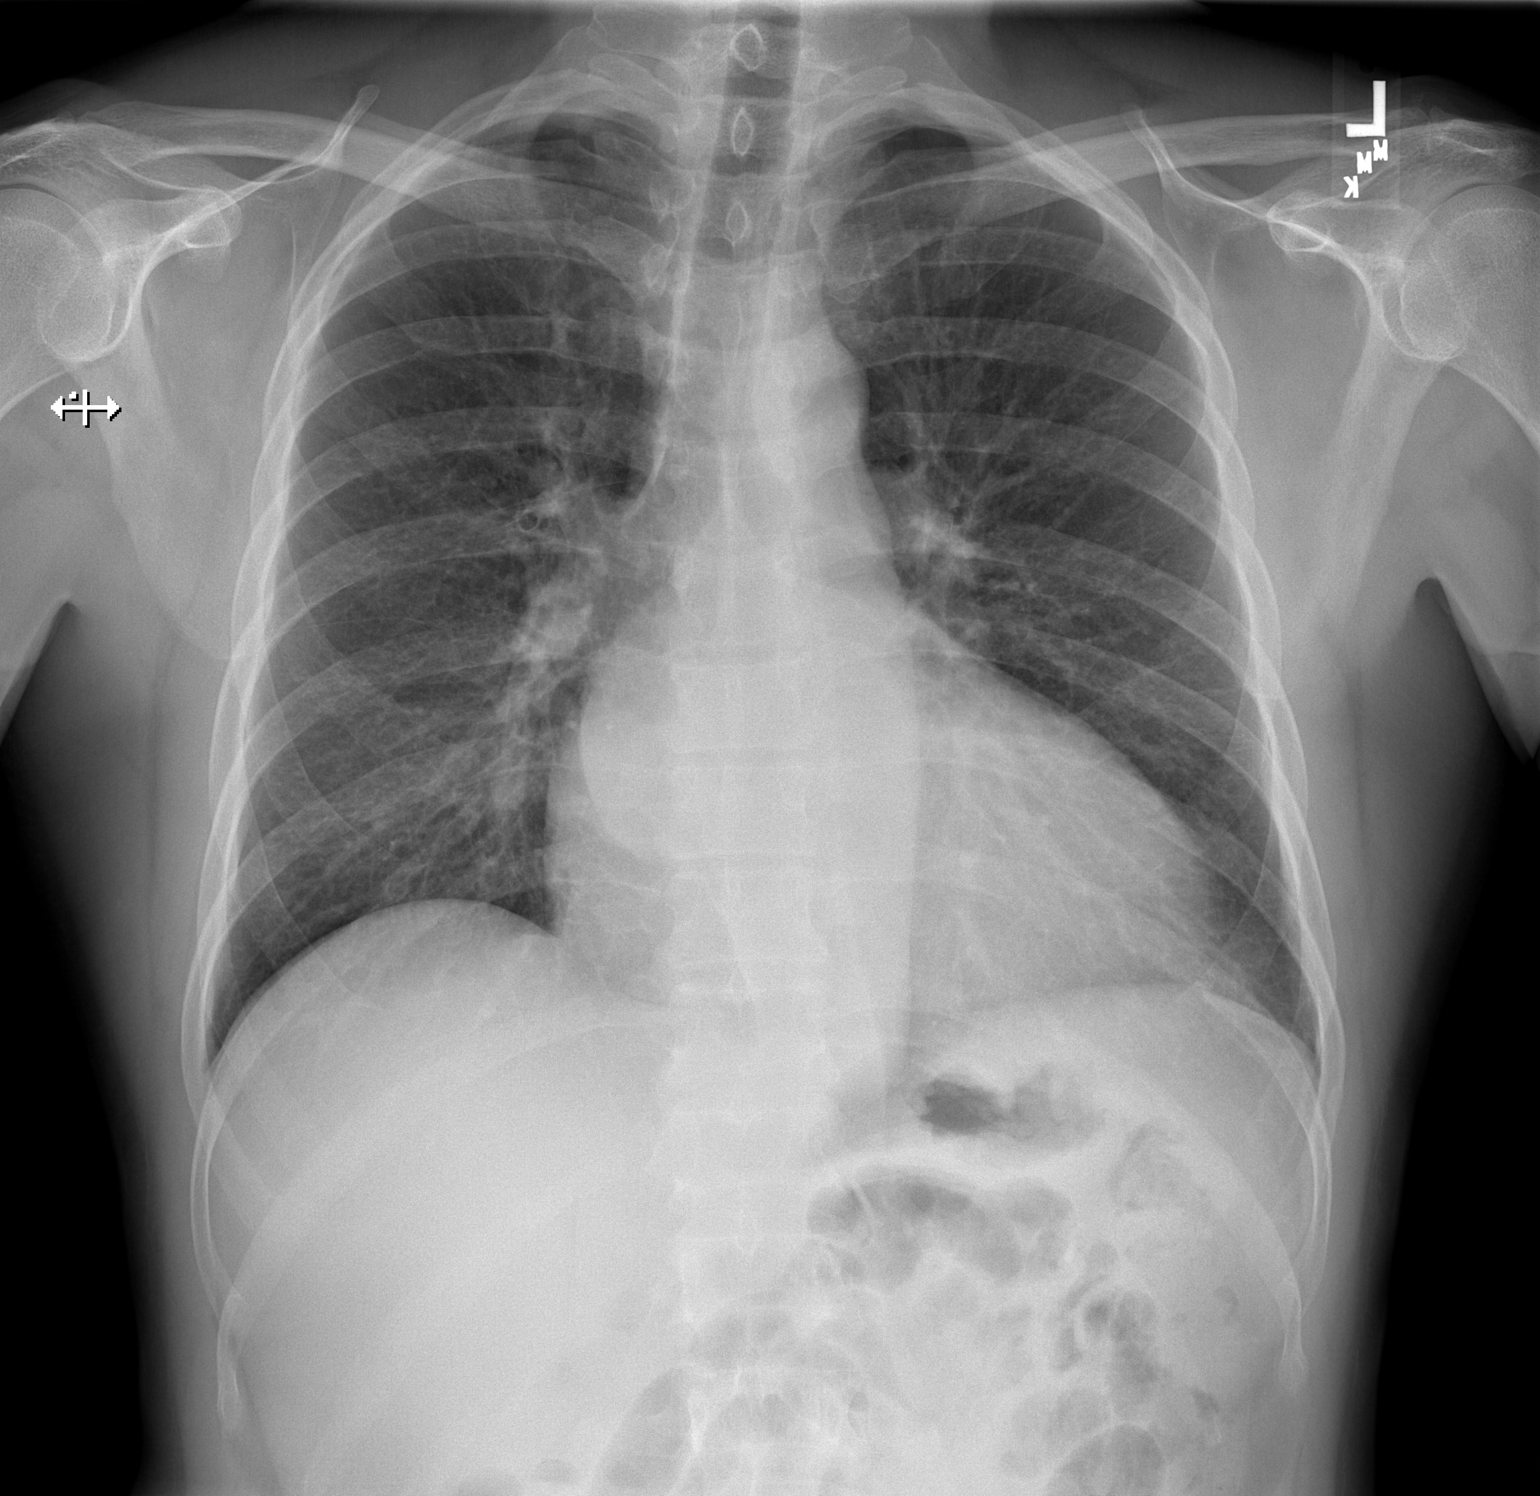

[w chest lat]
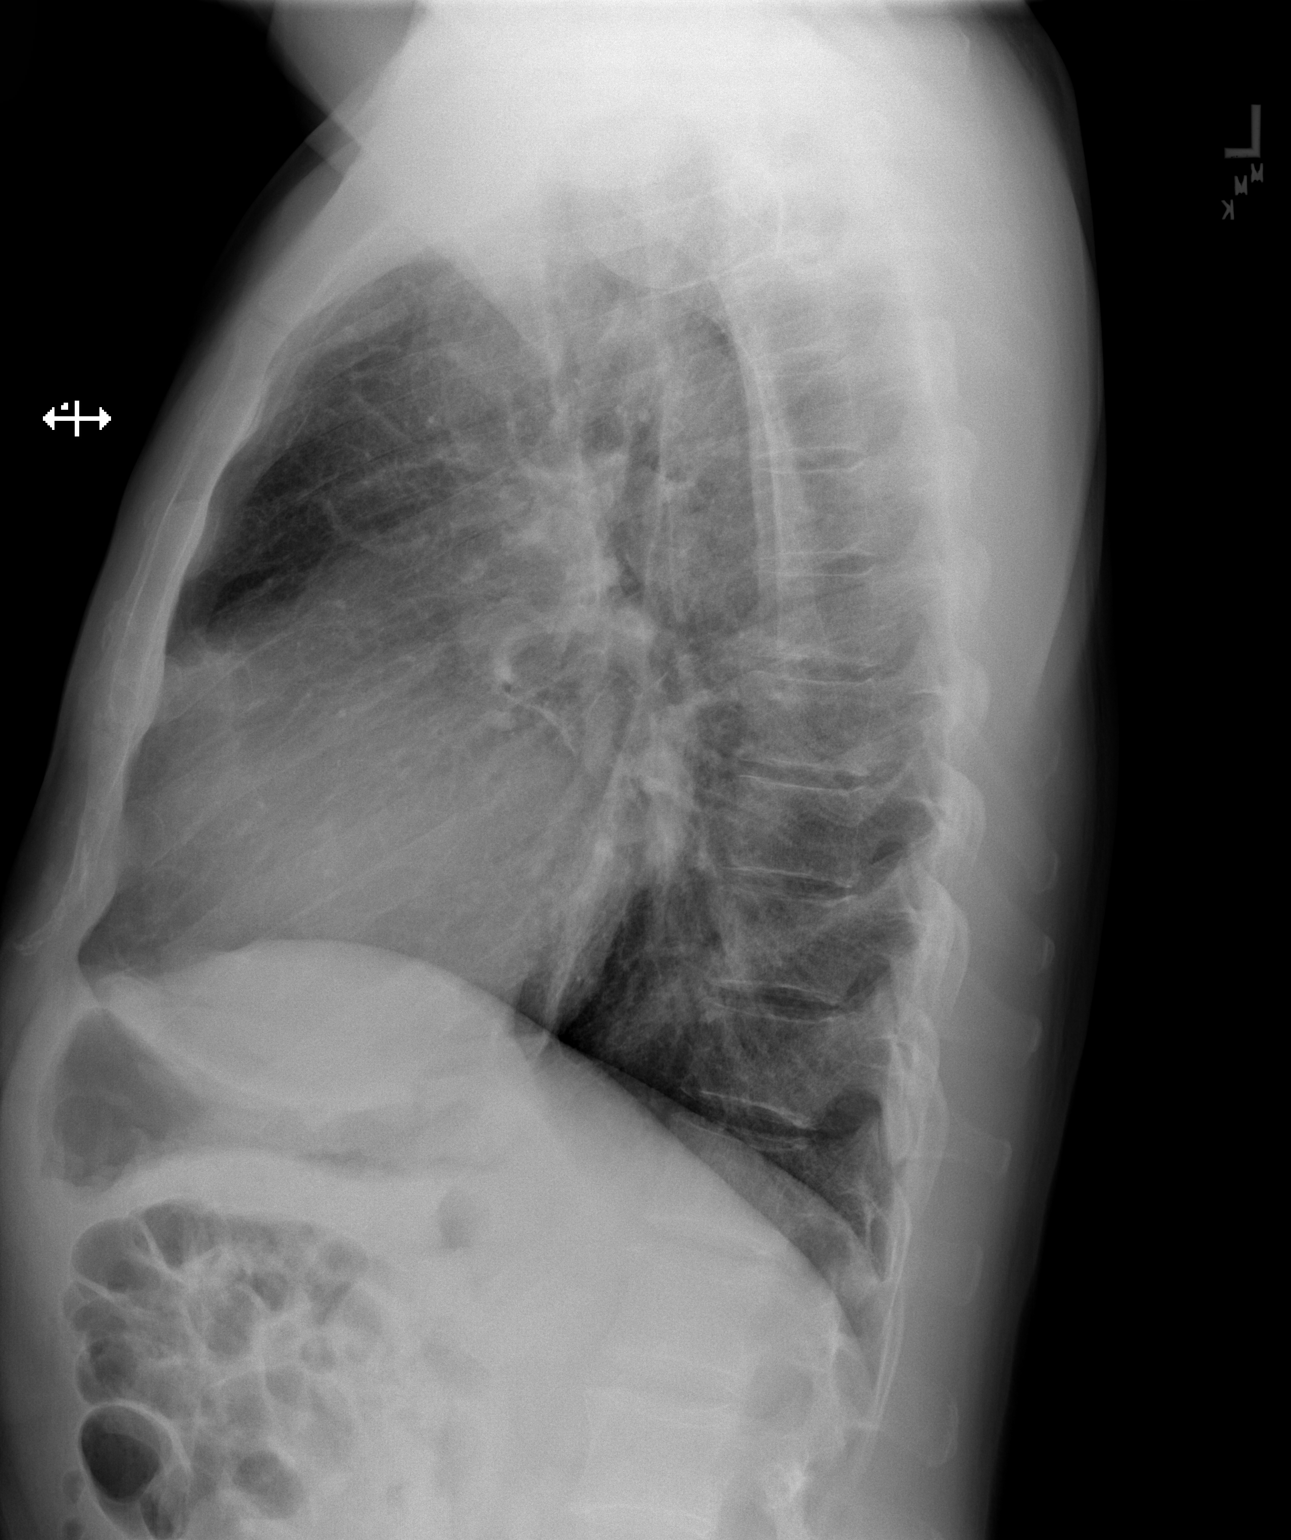

[2 of 2 positions shown; findings below may reference images not displayed]

FINDINGS: The lungs are adequately inflated. There is subtle increased density
in the retrosternal region inferiorly which may lie on the right.
The left lung is clear. The cardiac silhouette is mildly enlarged.
The pulmonary vascularity is normal. The mediastinum is normal in
width. The bony thorax exhibits no acute abnormality.
IMPRESSION: Mild right-sided infrahilar/retrosternal soft tissue prominence that
that may reflect atelectasis or pneumonia or a soft tissue mass.
Chest CT scanning is recommended prior the patient's surgical
procedure.

Mild cardiomegaly without pulmonary edema.  No pleural effusion.

These results will be called to the ordering clinician or
representative by the Radiologist Assistant, and communication
documented in the PACS or zVision Dashboard.

## 2018-08-08 DIAGNOSIS — I4891 Unspecified atrial fibrillation: Secondary | ICD-10-CM | POA: Diagnosis not present

## 2018-08-08 DIAGNOSIS — Z125 Encounter for screening for malignant neoplasm of prostate: Secondary | ICD-10-CM | POA: Diagnosis not present

## 2018-08-08 DIAGNOSIS — Z23 Encounter for immunization: Secondary | ICD-10-CM | POA: Diagnosis not present

## 2018-08-08 DIAGNOSIS — Z0000 Encounter for general adult medical examination without abnormal findings: Secondary | ICD-10-CM | POA: Diagnosis not present

## 2018-08-08 DIAGNOSIS — E7800 Pure hypercholesterolemia, unspecified: Secondary | ICD-10-CM | POA: Diagnosis not present

## 2018-08-13 IMAGING — CR DG CHEST 1V PORT
1 series · 1 of 1 positions shown · non-contrast
Comparison: 11/14/2016, 11/13/2016, 11/12/2016

CLINICAL DATA: 60-year-old male with a history of chest tube.
Status post mitral valve replacement. Surgery 11/11/2016, with
minimally invasive mitral valve replacement, tricuspid valve repair,
Maze procedure and left atrial appendage amputation/clip.

EXAM:
PORTABLE CHEST 1 VIEW

[AP]
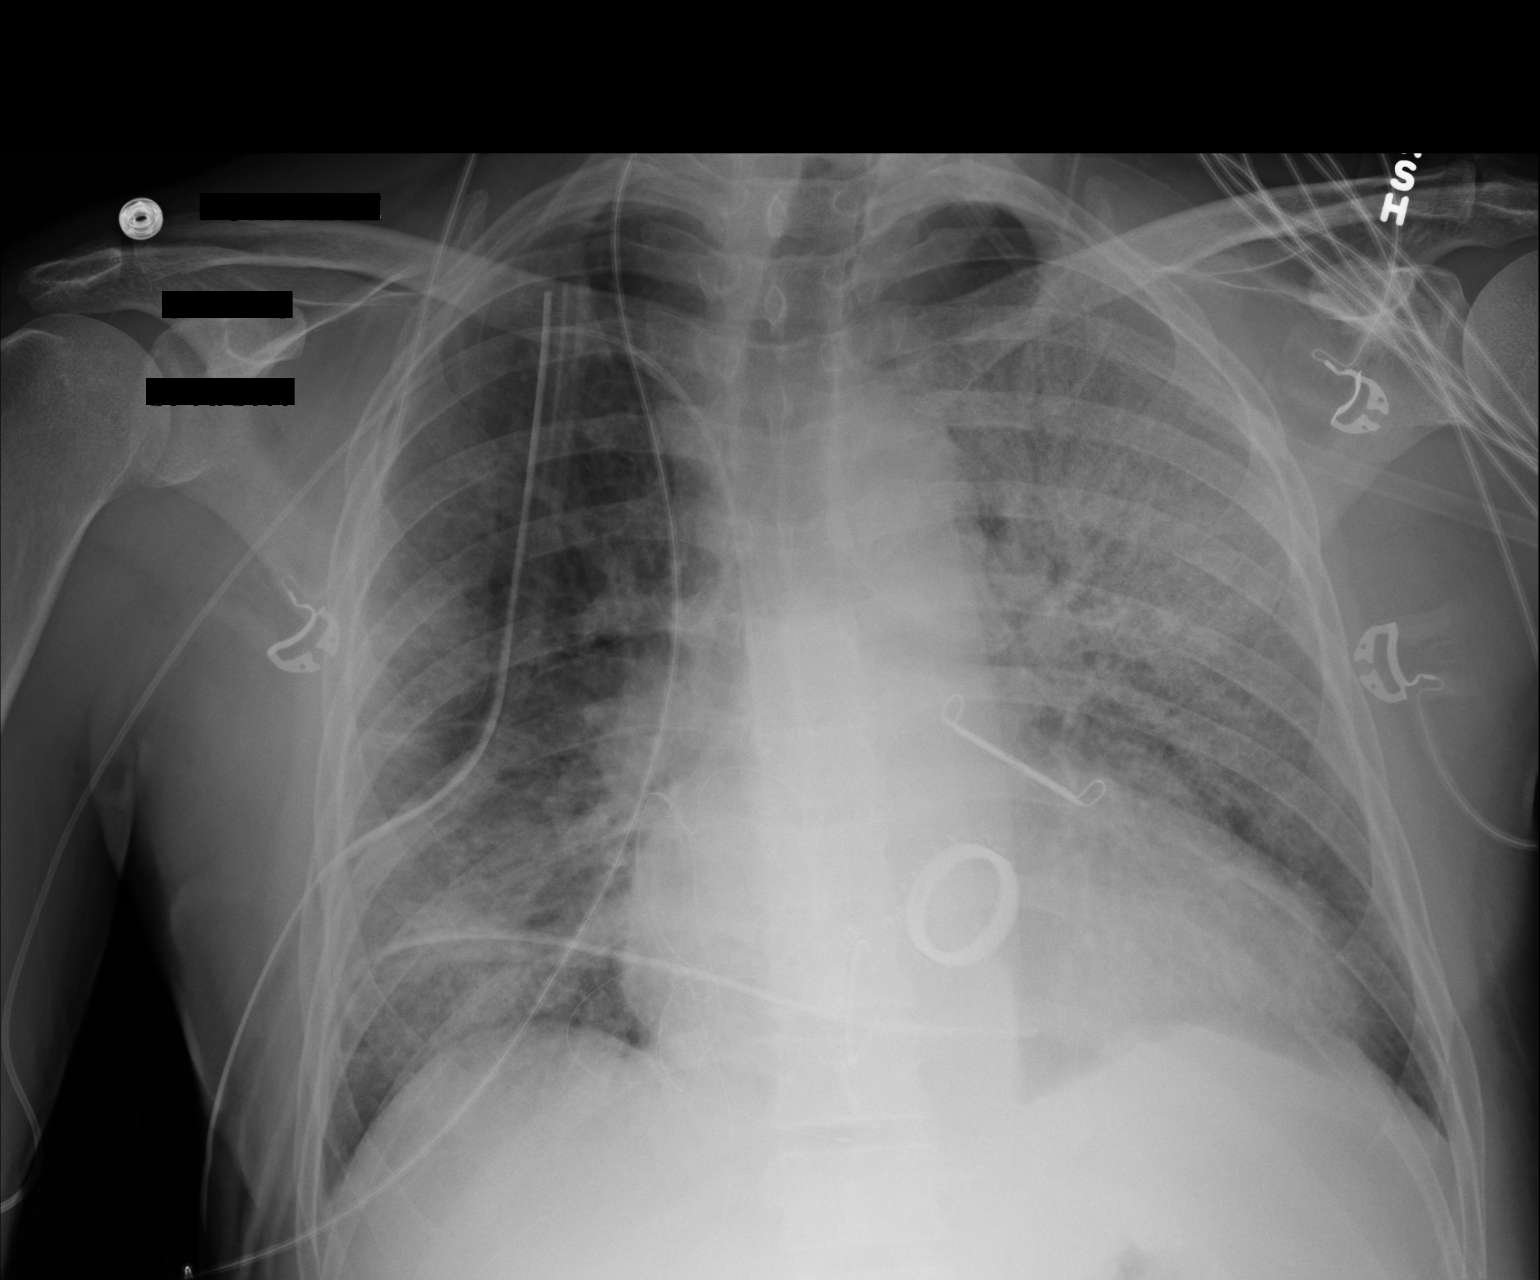

[1 of 1 positions shown; findings below may reference images not displayed]

FINDINGS: Cardiomediastinal silhouette is unchanged, with postoperative
changes of atrial appendage clipping, mitral valve and tricuspid
valve repair.

Unchanged position of 2 left-sided pleural/mediastinal drains.
Unchanged epicardial pacing leads.

Slight worsening of left upper lung airspace opacity with mild air
bronchograms.

Improving right lower lobe interstitial and airspace opacity since
the study dated 11/12/2016.

No visualized pneumothorax.  No large pleural effusion.
IMPRESSION: Since yesterday's chest x-ray there is slight worsening of airspace
disease of the left upper lung.

There is continued improvement of the right lower lobe
interstitial/airspace opacity from the study dated 11/12/2016.

Re- demonstration of 2 right-sided pleural/mediastinal drains.
Unchanged epicardial pacing leads. No pneumothorax.

Surgical changes of atrial clipping, tricuspid and mitral valve
repair.

## 2018-08-15 IMAGING — DX DG CHEST 2V
2 series · 2 of 2 positions shown · non-contrast
Comparison: Chest x-ray of 11/15/2016

CLINICAL DATA: Right-sided chest tube removal, followup

EXAM:
CHEST  2 VIEW

[chest pa]
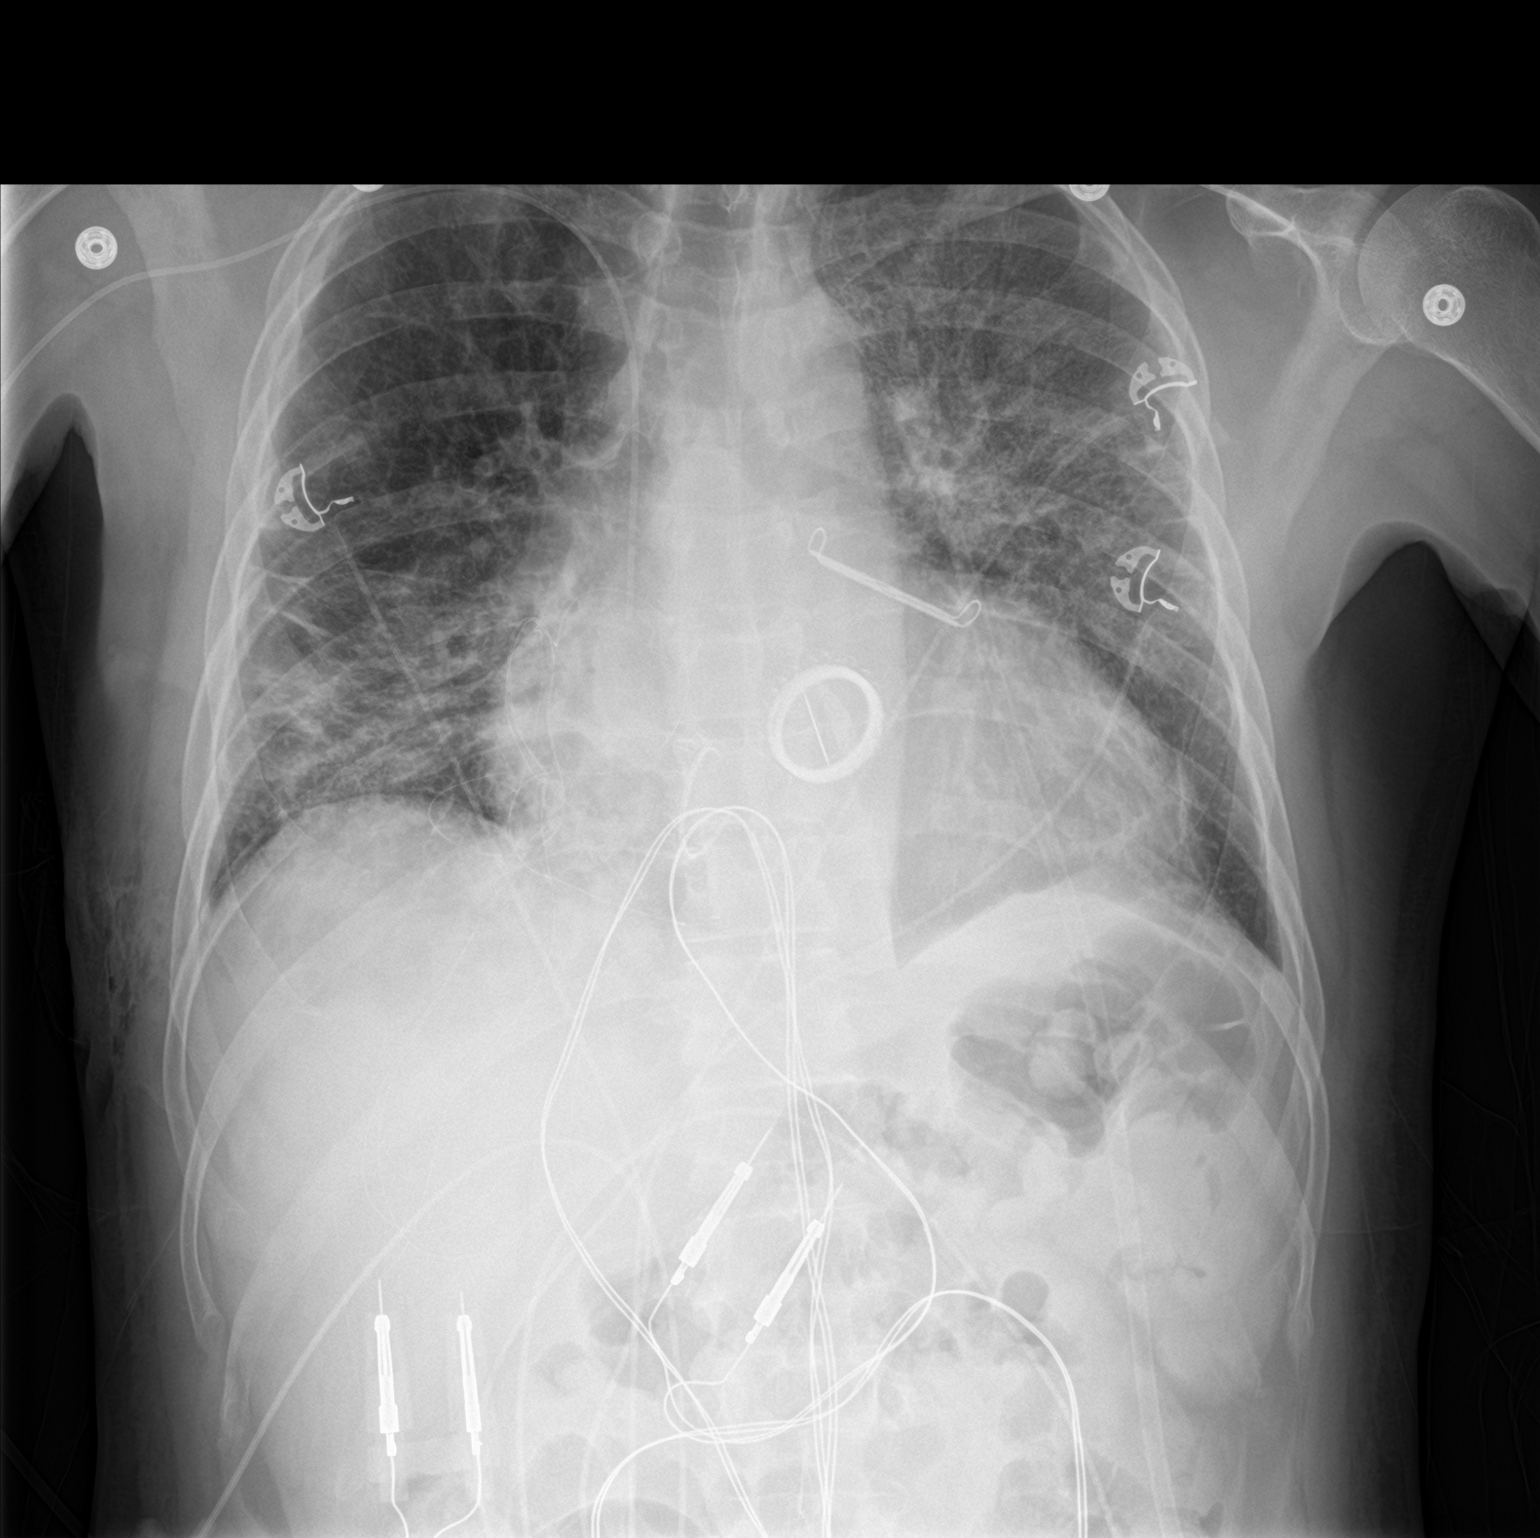

[chest lat]
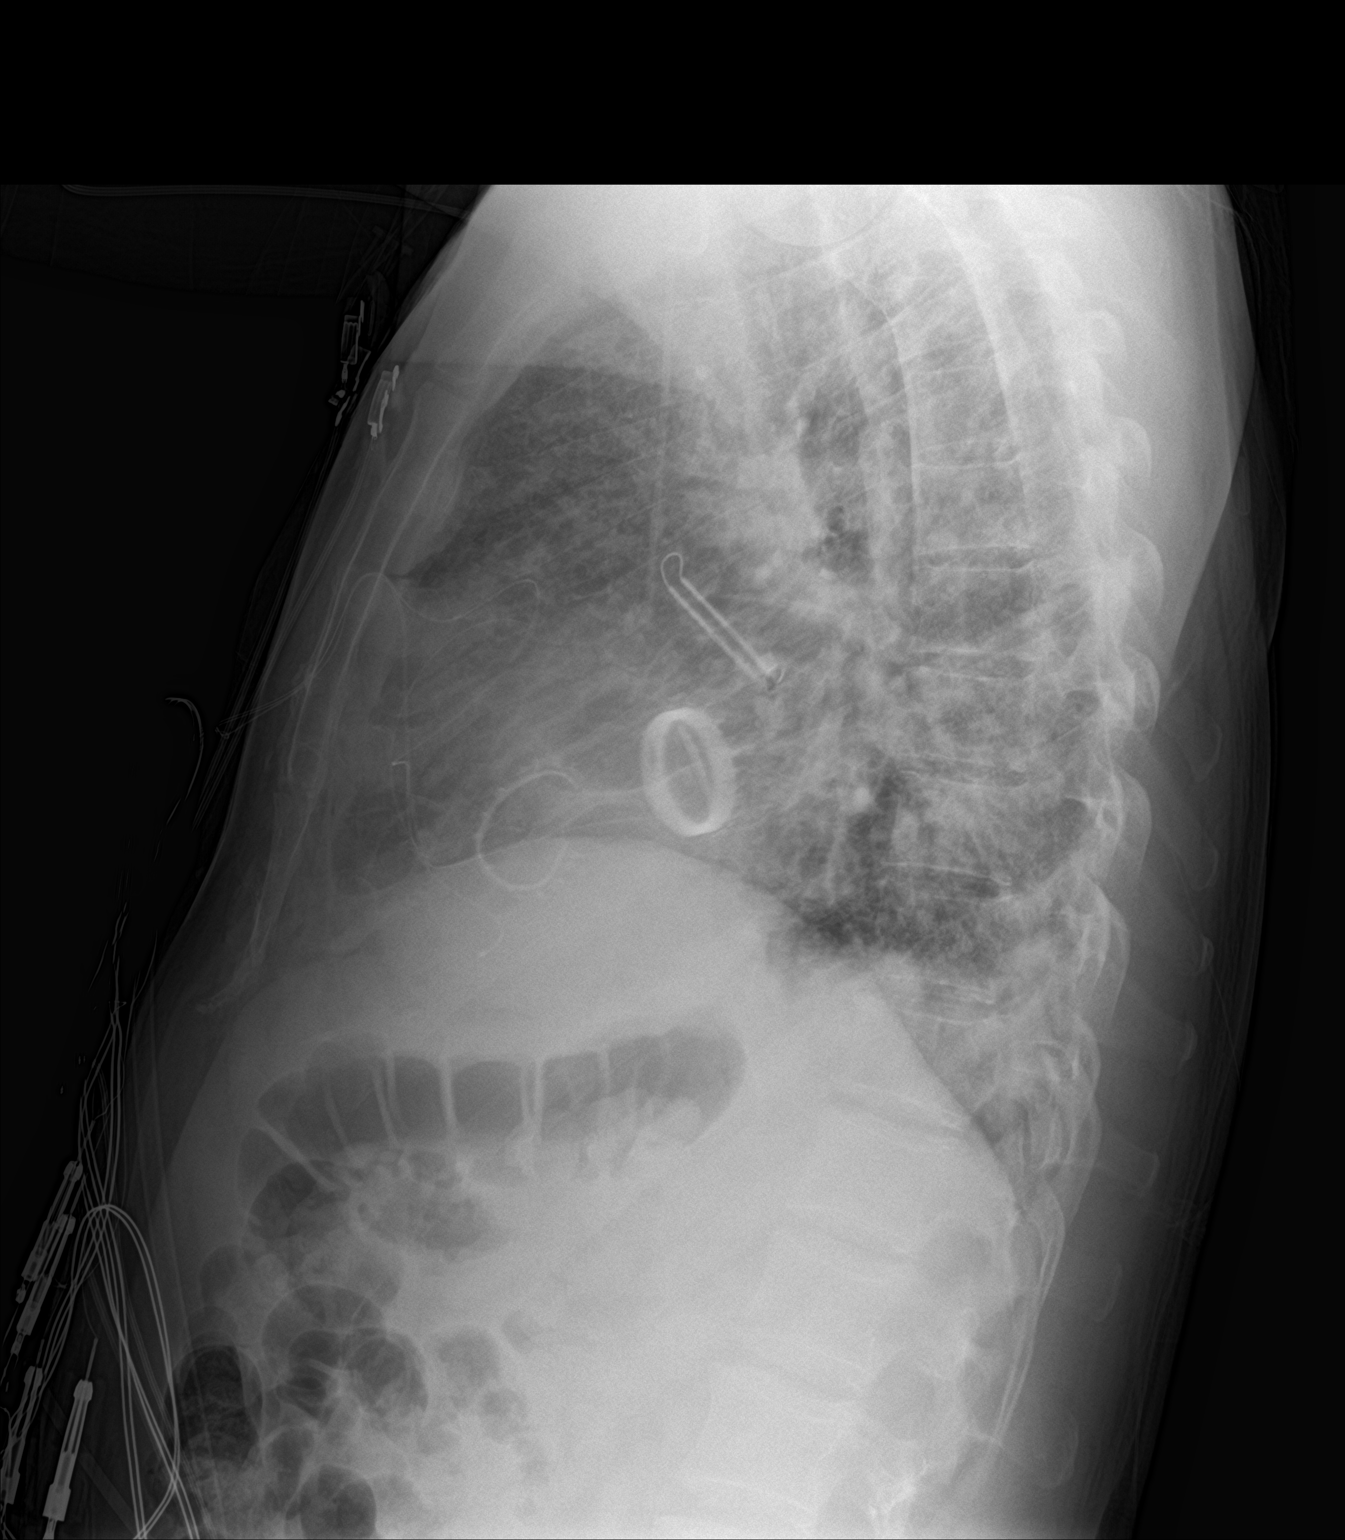

[2 of 2 positions shown; findings below may reference images not displayed]

FINDINGS: After right chest tube removal no definite right pneumothorax is
seen. There is some linear atelectasis at the right lung base.
Airspace disease has improved within the left mid and upper lung and
at the right lung base in the interval. Cardiomegaly is stable and
mitral valve replacement is noted with aortic valve replacement and
left atrial appendage exclusion device noted as well. There appears
to be a small left pleural effusion. Right PICC line tip overlies
the lower SVC.
IMPRESSION: 1. After removal of the right chest tube no definite right
pneumothorax is seen.
2. Some improvement in airspace disease in the left mid and upper
lung and the right lung base.
3. Stable cardiomegaly with probable small left pleural effusion.

## 2018-08-24 ENCOUNTER — Other Ambulatory Visit: Payer: Self-pay | Admitting: Cardiology

## 2018-08-28 ENCOUNTER — Telehealth: Payer: Self-pay

## 2018-08-28 ENCOUNTER — Ambulatory Visit (INDEPENDENT_AMBULATORY_CARE_PROVIDER_SITE_OTHER): Payer: BLUE CROSS/BLUE SHIELD | Admitting: *Deleted

## 2018-08-28 DIAGNOSIS — I428 Other cardiomyopathies: Secondary | ICD-10-CM | POA: Diagnosis not present

## 2018-08-28 DIAGNOSIS — I5022 Chronic systolic (congestive) heart failure: Secondary | ICD-10-CM

## 2018-08-28 NOTE — Telephone Encounter (Signed)
Confirmed remote transmission w/ pt wife.   

## 2018-08-29 ENCOUNTER — Encounter: Payer: Self-pay | Admitting: Cardiology

## 2018-08-29 NOTE — Progress Notes (Signed)
Remote pacemaker transmission.   

## 2018-09-05 DIAGNOSIS — Z7901 Long term (current) use of anticoagulants: Secondary | ICD-10-CM | POA: Diagnosis not present

## 2018-09-05 DIAGNOSIS — Z125 Encounter for screening for malignant neoplasm of prostate: Secondary | ICD-10-CM | POA: Diagnosis not present

## 2018-09-19 LAB — CUP PACEART REMOTE DEVICE CHECK
Battery Remaining Longevity: 86 mo
Battery Voltage: 2.98 V
Brady Statistic AP VP Percent: 69.4 %
Brady Statistic AS VS Percent: 1.02 %
Brady Statistic RA Percent Paced: 68.84 %
Date Time Interrogation Session: 20191021205457
Implantable Lead Implant Date: 20180111
Implantable Lead Implant Date: 20180111
Implantable Lead Location: 753858
Implantable Lead Location: 753859
Implantable Lead Location: 753860
Implantable Lead Model: 4398
Implantable Lead Model: 5076
Lead Channel Impedance Value: 1083 Ohm
Lead Channel Impedance Value: 342 Ohm
Lead Channel Impedance Value: 342 Ohm
Lead Channel Impedance Value: 532 Ohm
Lead Channel Impedance Value: 684 Ohm
Lead Channel Impedance Value: 912 Ohm
Lead Channel Pacing Threshold Amplitude: 0.75 V
Lead Channel Pacing Threshold Amplitude: 0.75 V
Lead Channel Pacing Threshold Amplitude: 1.375 V
Lead Channel Pacing Threshold Pulse Width: 0.4 ms
Lead Channel Pacing Threshold Pulse Width: 0.4 ms
Lead Channel Pacing Threshold Pulse Width: 1 ms
Lead Channel Sensing Intrinsic Amplitude: 3.25 mV
Lead Channel Sensing Intrinsic Amplitude: 6 mV
Lead Channel Setting Pacing Amplitude: 2 V
Lead Channel Setting Pacing Amplitude: 2 V
Lead Channel Setting Pacing Pulse Width: 0.4 ms
Lead Channel Setting Pacing Pulse Width: 1 ms
Lead Channel Setting Sensing Sensitivity: 2 mV
MDC IDC LEAD IMPLANT DT: 20180111
MDC IDC MSMT LEADCHNL LV IMPEDANCE VALUE: 494 Ohm
MDC IDC MSMT LEADCHNL LV IMPEDANCE VALUE: 722 Ohm
MDC IDC MSMT LEADCHNL RA SENSING INTR AMPL: 3.25 mV
MDC IDC MSMT LEADCHNL RV IMPEDANCE VALUE: 494 Ohm
MDC IDC MSMT LEADCHNL RV SENSING INTR AMPL: 6 mV
MDC IDC PG IMPLANT DT: 20180111
MDC IDC SET LEADCHNL RV PACING AMPLITUDE: 2.5 V
MDC IDC STAT BRADY AP VS PERCENT: 0.14 %
MDC IDC STAT BRADY AS VP PERCENT: 29.44 %
MDC IDC STAT BRADY RV PERCENT PACED: 98.53 %

## 2018-10-03 DIAGNOSIS — Z7901 Long term (current) use of anticoagulants: Secondary | ICD-10-CM | POA: Diagnosis not present
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
## 2018-10-10 ENCOUNTER — Ambulatory Visit: Payer: BLUE CROSS/BLUE SHIELD | Admitting: Cardiology

## 2018-10-10 ENCOUNTER — Encounter: Payer: Self-pay | Admitting: Cardiology

## 2018-10-10 VITALS — BP 140/70 | HR 70 | Ht 69.5 in | Wt 178.2 lb

## 2018-10-10 DIAGNOSIS — Z952 Presence of prosthetic heart valve: Secondary | ICD-10-CM

## 2018-10-10 DIAGNOSIS — I340 Nonrheumatic mitral (valve) insufficiency: Secondary | ICD-10-CM | POA: Diagnosis not present

## 2018-10-10 DIAGNOSIS — I480 Paroxysmal atrial fibrillation: Secondary | ICD-10-CM

## 2018-10-10 DIAGNOSIS — I5022 Chronic systolic (congestive) heart failure: Secondary | ICD-10-CM

## 2018-10-17 DIAGNOSIS — Z7901 Long term (current) use of anticoagulants: Secondary | ICD-10-CM | POA: Diagnosis not present

## 2018-11-03 DIAGNOSIS — Z7901 Long term (current) use of anticoagulants: Secondary | ICD-10-CM | POA: Diagnosis not present

## 2018-11-17 DIAGNOSIS — Z7901 Long term (current) use of anticoagulants: Secondary | ICD-10-CM | POA: Diagnosis not present

## 2018-11-27 ENCOUNTER — Ambulatory Visit (INDEPENDENT_AMBULATORY_CARE_PROVIDER_SITE_OTHER): Payer: BLUE CROSS/BLUE SHIELD

## 2018-11-27 DIAGNOSIS — I428 Other cardiomyopathies: Secondary | ICD-10-CM | POA: Diagnosis not present

## 2018-11-27 DIAGNOSIS — I442 Atrioventricular block, complete: Secondary | ICD-10-CM

## 2018-11-28 LAB — CUP PACEART REMOTE DEVICE CHECK
Battery Voltage: 2.98 V
Brady Statistic AP VP Percent: 71.94 %
Brady Statistic AP VS Percent: 0.28 %
Brady Statistic AS VP Percent: 26.92 %
Brady Statistic AS VS Percent: 0.86 %
Implantable Lead Implant Date: 20180111
Implantable Lead Location: 753858
Implantable Pulse Generator Implant Date: 20180111
Lead Channel Impedance Value: 342 Ohm
Lead Channel Impedance Value: 342 Ohm
Lead Channel Impedance Value: 513 Ohm
Lead Channel Impedance Value: 703 Ohm
Lead Channel Impedance Value: 741 Ohm
Lead Channel Impedance Value: 931 Ohm
Lead Channel Pacing Threshold Amplitude: 0.625 V
Lead Channel Pacing Threshold Amplitude: 1.125 V
Lead Channel Pacing Threshold Pulse Width: 0.4 ms
Lead Channel Pacing Threshold Pulse Width: 1 ms
Lead Channel Sensing Intrinsic Amplitude: 5 mV
Lead Channel Setting Pacing Amplitude: 1.75 V
Lead Channel Setting Pacing Amplitude: 2 V
Lead Channel Setting Pacing Pulse Width: 0.4 ms
Lead Channel Setting Pacing Pulse Width: 1 ms
MDC IDC LEAD IMPLANT DT: 20180111
MDC IDC LEAD IMPLANT DT: 20180111
MDC IDC LEAD LOCATION: 753859
MDC IDC LEAD LOCATION: 753860
MDC IDC MSMT BATTERY REMAINING LONGEVITY: 87 mo
MDC IDC MSMT LEADCHNL LV IMPEDANCE VALUE: 1140 Ohm
MDC IDC MSMT LEADCHNL LV IMPEDANCE VALUE: 532 Ohm
MDC IDC MSMT LEADCHNL RA IMPEDANCE VALUE: 532 Ohm
MDC IDC MSMT LEADCHNL RA SENSING INTR AMPL: 2.375 mV
MDC IDC MSMT LEADCHNL RA SENSING INTR AMPL: 2.375 mV
MDC IDC MSMT LEADCHNL RV PACING THRESHOLD AMPLITUDE: 0.75 V
MDC IDC MSMT LEADCHNL RV PACING THRESHOLD PULSEWIDTH: 0.4 ms
MDC IDC MSMT LEADCHNL RV SENSING INTR AMPL: 5 mV
MDC IDC SESS DTM: 20200120214746
MDC IDC SET LEADCHNL RV PACING AMPLITUDE: 2.5 V
MDC IDC SET LEADCHNL RV SENSING SENSITIVITY: 2 mV
MDC IDC STAT BRADY RA PERCENT PACED: 71.81 %
MDC IDC STAT BRADY RV PERCENT PACED: 98.64 %

## 2018-11-28 NOTE — Progress Notes (Signed)
Remote pacemaker transmission.   

## 2018-12-18 DIAGNOSIS — Z7901 Long term (current) use of anticoagulants: Secondary | ICD-10-CM | POA: Diagnosis not present

## 2019-01-05 DIAGNOSIS — H40033 Anatomical narrow angle, bilateral: Secondary | ICD-10-CM | POA: Diagnosis not present

## 2019-01-15 DIAGNOSIS — Z7901 Long term (current) use of anticoagulants: Secondary | ICD-10-CM | POA: Diagnosis not present

## 2019-02-12 DIAGNOSIS — Z7901 Long term (current) use of anticoagulants: Secondary | ICD-10-CM | POA: Diagnosis not present

## 2019-02-15 ENCOUNTER — Telehealth: Payer: Self-pay | Admitting: Cardiology

## 2019-02-15 NOTE — Telephone Encounter (Signed)
Patient came in today and states he wants to speak with Dr. Swaziland about retirement.
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
## 2019-02-15 NOTE — Telephone Encounter (Signed)
Spoke to patient he stated he has been dizzy,lightheaded.Stated he has not noticed fast heart beat.Stated he would like appointment to see Dr.Jordan.Appointment scheduled 4/10 at 9:00 am.

## 2019-02-16 ENCOUNTER — Other Ambulatory Visit: Payer: Self-pay

## 2019-02-16 ENCOUNTER — Ambulatory Visit (INDEPENDENT_AMBULATORY_CARE_PROVIDER_SITE_OTHER): Payer: BLUE CROSS/BLUE SHIELD | Admitting: Cardiology

## 2019-02-16 ENCOUNTER — Encounter: Payer: Self-pay | Admitting: Cardiology

## 2019-02-16 VITALS — BP 128/72 | HR 71 | Resp 12 | Ht 70.0 in | Wt 181.4 lb

## 2019-02-16 DIAGNOSIS — I480 Paroxysmal atrial fibrillation: Secondary | ICD-10-CM | POA: Diagnosis not present

## 2019-02-16 DIAGNOSIS — R42 Dizziness and giddiness: Secondary | ICD-10-CM

## 2019-02-16 DIAGNOSIS — Z950 Presence of cardiac pacemaker: Secondary | ICD-10-CM

## 2019-02-16 DIAGNOSIS — Z952 Presence of prosthetic heart valve: Secondary | ICD-10-CM

## 2019-02-16 DIAGNOSIS — I340 Nonrheumatic mitral (valve) insufficiency: Secondary | ICD-10-CM

## 2019-02-16 DIAGNOSIS — I5022 Chronic systolic (congestive) heart failure: Secondary | ICD-10-CM

## 2019-02-16 NOTE — Addendum Note (Signed)
Addended by: Neoma Laming on: 02/16/2019 09:44 AM   Modules accepted: Orders

## 2019-02-19 ENCOUNTER — Telehealth: Payer: Self-pay | Admitting: Cardiology

## 2019-02-26 ENCOUNTER — Other Ambulatory Visit: Payer: Self-pay

## 2019-02-26 ENCOUNTER — Ambulatory Visit (INDEPENDENT_AMBULATORY_CARE_PROVIDER_SITE_OTHER): Payer: BLUE CROSS/BLUE SHIELD | Admitting: *Deleted

## 2019-02-26 DIAGNOSIS — I442 Atrioventricular block, complete: Secondary | ICD-10-CM | POA: Diagnosis not present

## 2019-02-26 LAB — CUP PACEART REMOTE DEVICE CHECK
Battery Remaining Longevity: 84 mo
Battery Voltage: 2.97 V
Brady Statistic AP VP Percent: 72.46 %
Brady Statistic AP VS Percent: 0.08 %
Brady Statistic AS VP Percent: 26.77 %
Brady Statistic AS VS Percent: 0.68 %
Brady Statistic RA Percent Paced: 72.38 %
Brady Statistic RV Percent Paced: 99.21 %
Date Time Interrogation Session: 20200420043935
Implantable Lead Implant Date: 20180111
Implantable Lead Implant Date: 20180111
Implantable Lead Implant Date: 20180111
Implantable Lead Location: 753858
Implantable Lead Location: 753859
Implantable Lead Location: 753860
Implantable Lead Model: 4398
Implantable Lead Model: 5076
Implantable Lead Model: 5076
Implantable Pulse Generator Implant Date: 20180111
Lead Channel Impedance Value: 1026 Ohm
Lead Channel Impedance Value: 342 Ohm
Lead Channel Impedance Value: 342 Ohm
Lead Channel Impedance Value: 494 Ohm
Lead Channel Impedance Value: 494 Ohm
Lead Channel Impedance Value: 494 Ohm
Lead Channel Impedance Value: 665 Ohm
Lead Channel Impedance Value: 684 Ohm
Lead Channel Impedance Value: 855 Ohm
Lead Channel Pacing Threshold Amplitude: 0.625 V
Lead Channel Pacing Threshold Amplitude: 0.75 V
Lead Channel Pacing Threshold Amplitude: 1 V
Lead Channel Pacing Threshold Pulse Width: 0.4 ms
Lead Channel Pacing Threshold Pulse Width: 0.4 ms
Lead Channel Pacing Threshold Pulse Width: 1 ms
Lead Channel Sensing Intrinsic Amplitude: 2 mV
Lead Channel Sensing Intrinsic Amplitude: 2 mV
Lead Channel Sensing Intrinsic Amplitude: 6.125 mV
Lead Channel Sensing Intrinsic Amplitude: 6.125 mV
Lead Channel Setting Pacing Amplitude: 1.5 V
Lead Channel Setting Pacing Amplitude: 2 V
Lead Channel Setting Pacing Amplitude: 2.5 V
Lead Channel Setting Pacing Pulse Width: 0.4 ms
Lead Channel Setting Pacing Pulse Width: 1 ms
Lead Channel Setting Sensing Sensitivity: 2 mV

## 2019-03-06 ENCOUNTER — Encounter: Payer: Self-pay | Admitting: Cardiology

## 2019-03-06 NOTE — Progress Notes (Signed)
Remote pacemaker transmission.   

## 2019-03-15 DIAGNOSIS — Z7901 Long term (current) use of anticoagulants: Secondary | ICD-10-CM | POA: Diagnosis not present

## 2019-03-29 DIAGNOSIS — Z7901 Long term (current) use of anticoagulants: Secondary | ICD-10-CM | POA: Diagnosis not present

## 2019-04-26 DIAGNOSIS — Z7901 Long term (current) use of anticoagulants: Secondary | ICD-10-CM | POA: Diagnosis not present

## 2019-05-10 DIAGNOSIS — Z7901 Long term (current) use of anticoagulants: Secondary | ICD-10-CM | POA: Diagnosis not present

## 2019-05-28 ENCOUNTER — Ambulatory Visit (INDEPENDENT_AMBULATORY_CARE_PROVIDER_SITE_OTHER): Payer: BC Managed Care – PPO | Admitting: *Deleted

## 2019-05-28 DIAGNOSIS — I5022 Chronic systolic (congestive) heart failure: Secondary | ICD-10-CM

## 2019-05-28 DIAGNOSIS — I442 Atrioventricular block, complete: Secondary | ICD-10-CM | POA: Diagnosis not present

## 2019-05-28 LAB — CUP PACEART REMOTE DEVICE CHECK
Battery Remaining Longevity: 79 mo
Battery Voltage: 2.97 V
Brady Statistic AP VP Percent: 70.9 %
Brady Statistic AP VS Percent: 0.12 %
Brady Statistic AS VP Percent: 28.11 %
Brady Statistic AS VS Percent: 0.86 %
Brady Statistic RA Percent Paced: 70.37 %
Brady Statistic RV Percent Paced: 98.88 %
Date Time Interrogation Session: 20200720060119
Implantable Lead Implant Date: 20180111
Implantable Lead Implant Date: 20180111
Implantable Lead Implant Date: 20180111
Implantable Lead Location: 753858
Implantable Lead Location: 753859
Implantable Lead Location: 753860
Implantable Lead Model: 4398
Implantable Lead Model: 5076
Implantable Lead Model: 5076
Implantable Pulse Generator Implant Date: 20180111
Lead Channel Impedance Value: 1045 Ohm
Lead Channel Impedance Value: 342 Ohm
Lead Channel Impedance Value: 361 Ohm
Lead Channel Impedance Value: 475 Ohm
Lead Channel Impedance Value: 494 Ohm
Lead Channel Impedance Value: 513 Ohm
Lead Channel Impedance Value: 684 Ohm
Lead Channel Impedance Value: 703 Ohm
Lead Channel Impedance Value: 874 Ohm
Lead Channel Pacing Threshold Amplitude: 0.5 V
Lead Channel Pacing Threshold Amplitude: 0.75 V
Lead Channel Pacing Threshold Amplitude: 1 V
Lead Channel Pacing Threshold Pulse Width: 0.4 ms
Lead Channel Pacing Threshold Pulse Width: 0.4 ms
Lead Channel Pacing Threshold Pulse Width: 1 ms
Lead Channel Sensing Intrinsic Amplitude: 2.25 mV
Lead Channel Sensing Intrinsic Amplitude: 2.25 mV
Lead Channel Sensing Intrinsic Amplitude: 5.75 mV
Lead Channel Sensing Intrinsic Amplitude: 5.75 mV
Lead Channel Setting Pacing Amplitude: 1.5 V
Lead Channel Setting Pacing Amplitude: 2 V
Lead Channel Setting Pacing Amplitude: 2.5 V
Lead Channel Setting Pacing Pulse Width: 0.4 ms
Lead Channel Setting Pacing Pulse Width: 1 ms
Lead Channel Setting Sensing Sensitivity: 2 mV

## 2019-06-07 DIAGNOSIS — Z7901 Long term (current) use of anticoagulants: Secondary | ICD-10-CM | POA: Diagnosis not present

## 2019-06-11 ENCOUNTER — Encounter: Payer: Self-pay | Admitting: Cardiology

## 2019-06-11 NOTE — Progress Notes (Signed)
Remote pacemaker transmission.   

## 2019-07-05 DIAGNOSIS — Z7901 Long term (current) use of anticoagulants: Secondary | ICD-10-CM | POA: Diagnosis not present

## 2019-07-30 ENCOUNTER — Other Ambulatory Visit: Payer: Self-pay | Admitting: Cardiology

## 2019-08-02 DIAGNOSIS — Z23 Encounter for immunization: Secondary | ICD-10-CM | POA: Diagnosis not present

## 2019-08-02 DIAGNOSIS — Z7901 Long term (current) use of anticoagulants: Secondary | ICD-10-CM | POA: Diagnosis not present

## 2019-08-27 ENCOUNTER — Ambulatory Visit (INDEPENDENT_AMBULATORY_CARE_PROVIDER_SITE_OTHER): Payer: BC Managed Care – PPO | Admitting: *Deleted

## 2019-08-27 DIAGNOSIS — I442 Atrioventricular block, complete: Secondary | ICD-10-CM

## 2019-08-27 DIAGNOSIS — I5022 Chronic systolic (congestive) heart failure: Secondary | ICD-10-CM

## 2019-08-27 LAB — CUP PACEART REMOTE DEVICE CHECK
Battery Remaining Longevity: 79 mo
Battery Voltage: 2.96 V
Brady Statistic AP VP Percent: 70.26 %
Brady Statistic AP VS Percent: 0.11 %
Brady Statistic AS VP Percent: 28.72 %
Brady Statistic AS VS Percent: 0.9 %
Brady Statistic RA Percent Paced: 69.92 %
Brady Statistic RV Percent Paced: 98.87 %
Date Time Interrogation Session: 20201019060214
Implantable Lead Implant Date: 20180111
Implantable Lead Implant Date: 20180111
Implantable Lead Implant Date: 20180111
Implantable Lead Location: 753858
Implantable Lead Location: 753859
Implantable Lead Location: 753860
Implantable Lead Model: 4398
Implantable Lead Model: 5076
Implantable Lead Model: 5076
Implantable Pulse Generator Implant Date: 20180111
Lead Channel Impedance Value: 1045 Ohm
Lead Channel Impedance Value: 342 Ohm
Lead Channel Impedance Value: 361 Ohm
Lead Channel Impedance Value: 494 Ohm
Lead Channel Impedance Value: 494 Ohm
Lead Channel Impedance Value: 551 Ohm
Lead Channel Impedance Value: 646 Ohm
Lead Channel Impedance Value: 741 Ohm
Lead Channel Impedance Value: 836 Ohm
Lead Channel Pacing Threshold Amplitude: 0.625 V
Lead Channel Pacing Threshold Amplitude: 0.75 V
Lead Channel Pacing Threshold Amplitude: 0.75 V
Lead Channel Pacing Threshold Pulse Width: 0.4 ms
Lead Channel Pacing Threshold Pulse Width: 0.4 ms
Lead Channel Pacing Threshold Pulse Width: 1 ms
Lead Channel Sensing Intrinsic Amplitude: 2.375 mV
Lead Channel Sensing Intrinsic Amplitude: 2.375 mV
Lead Channel Sensing Intrinsic Amplitude: 5.25 mV
Lead Channel Sensing Intrinsic Amplitude: 5.25 mV
Lead Channel Setting Pacing Amplitude: 1.25 V
Lead Channel Setting Pacing Amplitude: 2 V
Lead Channel Setting Pacing Amplitude: 2.5 V
Lead Channel Setting Pacing Pulse Width: 0.4 ms
Lead Channel Setting Pacing Pulse Width: 1 ms
Lead Channel Setting Sensing Sensitivity: 2 mV

## 2019-08-28 ENCOUNTER — Ambulatory Visit (INDEPENDENT_AMBULATORY_CARE_PROVIDER_SITE_OTHER): Payer: BC Managed Care – PPO | Admitting: Internal Medicine

## 2019-08-28 ENCOUNTER — Other Ambulatory Visit: Payer: Self-pay

## 2019-08-28 ENCOUNTER — Encounter: Payer: Self-pay | Admitting: Internal Medicine

## 2019-08-28 VITALS — BP 144/78 | HR 70 | Ht 70.0 in | Wt 178.0 lb

## 2019-08-28 DIAGNOSIS — I442 Atrioventricular block, complete: Secondary | ICD-10-CM

## 2019-08-28 DIAGNOSIS — I4819 Other persistent atrial fibrillation: Secondary | ICD-10-CM

## 2019-08-28 DIAGNOSIS — Z950 Presence of cardiac pacemaker: Secondary | ICD-10-CM

## 2019-08-28 DIAGNOSIS — I5022 Chronic systolic (congestive) heart failure: Secondary | ICD-10-CM
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
## 2019-08-28 NOTE — Patient Instructions (Signed)

## 2019-08-30 DIAGNOSIS — Z7901 Long term (current) use of anticoagulants: Secondary | ICD-10-CM | POA: Diagnosis not present
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
## 2019-09-10 ENCOUNTER — Other Ambulatory Visit: Payer: Self-pay

## 2019-09-10 ENCOUNTER — Encounter: Payer: Self-pay | Admitting: Cardiology

## 2019-09-10 ENCOUNTER — Ambulatory Visit (INDEPENDENT_AMBULATORY_CARE_PROVIDER_SITE_OTHER): Payer: BC Managed Care – PPO | Admitting: Cardiology

## 2019-09-10 VITALS — BP 133/62 | HR 69 | Ht 70.0 in | Wt 182.0 lb

## 2019-09-10 DIAGNOSIS — I4819 Other persistent atrial fibrillation: Secondary | ICD-10-CM

## 2019-09-10 DIAGNOSIS — I442 Atrioventricular block, complete: Secondary | ICD-10-CM

## 2019-09-10 DIAGNOSIS — I340 Nonrheumatic mitral (valve) insufficiency: Secondary | ICD-10-CM

## 2019-09-10 DIAGNOSIS — Z952 Presence of prosthetic heart valve: Secondary | ICD-10-CM

## 2019-09-17 NOTE — Progress Notes (Signed)
Remote pacemaker transmission.   

## 2019-09-20 DIAGNOSIS — Z0000 Encounter for general adult medical examination without abnormal findings: Secondary | ICD-10-CM | POA: Diagnosis not present

## 2019-09-27 DIAGNOSIS — E7800 Pure hypercholesterolemia, unspecified: Secondary | ICD-10-CM | POA: Diagnosis not present

## 2019-09-27 DIAGNOSIS — Z7901 Long term (current) use of anticoagulants: Secondary | ICD-10-CM | POA: Diagnosis not present

## 2019-09-27 DIAGNOSIS — Z125 Encounter for screening for malignant neoplasm of prostate: Secondary | ICD-10-CM | POA: Diagnosis not present

## 2019-10-25 DIAGNOSIS — Z7901 Long term (current) use of anticoagulants: Secondary | ICD-10-CM | POA: Diagnosis not present

## 2019-11-22 DIAGNOSIS — Z7901 Long term (current) use of anticoagulants: Secondary | ICD-10-CM | POA: Diagnosis not present

## 2019-11-26 ENCOUNTER — Ambulatory Visit (INDEPENDENT_AMBULATORY_CARE_PROVIDER_SITE_OTHER): Payer: BC Managed Care – PPO | Admitting: *Deleted

## 2019-11-26 DIAGNOSIS — Z950 Presence of cardiac pacemaker: Secondary | ICD-10-CM

## 2019-11-26 LAB — CUP PACEART REMOTE DEVICE CHECK
Battery Remaining Longevity: 68 mo
Battery Voltage: 2.96 V
Brady Statistic AP VP Percent: 72.53 %
Brady Statistic AP VS Percent: 0.2 %
Brady Statistic AS VP Percent: 26.38 %
Brady Statistic AS VS Percent: 0.89 %
Brady Statistic RA Percent Paced: 72.29 %
Brady Statistic RV Percent Paced: 98.81 %
Date Time Interrogation Session: 20210118004839
Implantable Lead Implant Date: 20180111
Implantable Lead Implant Date: 20180111
Implantable Lead Implant Date: 20180111
Implantable Lead Location: 753858
Implantable Lead Location: 753859
Implantable Lead Location: 753860
Implantable Lead Model: 4398
Implantable Lead Model: 5076
Implantable Lead Model: 5076
Implantable Pulse Generator Implant Date: 20180111
Lead Channel Impedance Value: 323 Ohm
Lead Channel Impedance Value: 323 Ohm
Lead Channel Impedance Value: 456 Ohm
Lead Channel Impedance Value: 494 Ohm
Lead Channel Impedance Value: 494 Ohm
Lead Channel Impedance Value: 589 Ohm
Lead Channel Impedance Value: 684 Ohm
Lead Channel Impedance Value: 760 Ohm
Lead Channel Impedance Value: 950 Ohm
Lead Channel Pacing Threshold Amplitude: 0.625 V
Lead Channel Pacing Threshold Amplitude: 0.875 V
Lead Channel Pacing Threshold Amplitude: 1 V
Lead Channel Pacing Threshold Pulse Width: 0.4 ms
Lead Channel Pacing Threshold Pulse Width: 0.4 ms
Lead Channel Pacing Threshold Pulse Width: 1 ms
Lead Channel Sensing Intrinsic Amplitude: 2.875 mV
Lead Channel Sensing Intrinsic Amplitude: 2.875 mV
Lead Channel Sensing Intrinsic Amplitude: 5.75 mV
Lead Channel Sensing Intrinsic Amplitude: 5.75 mV
Lead Channel Setting Pacing Amplitude: 1.5 V
Lead Channel Setting Pacing Amplitude: 2 V
Lead Channel Setting Pacing Amplitude: 2.5 V
Lead Channel Setting Pacing Pulse Width: 0.4 ms
Lead Channel Setting Pacing Pulse Width: 1 ms
Lead Channel Setting Sensing Sensitivity: 2 mV

## 2019-11-27 NOTE — Progress Notes (Signed)
PPM remote 

## 2019-12-20 DIAGNOSIS — Z7901 Long term (current) use of anticoagulants: Secondary | ICD-10-CM | POA: Diagnosis not present

## 2020-02-25 ENCOUNTER — Ambulatory Visit (INDEPENDENT_AMBULATORY_CARE_PROVIDER_SITE_OTHER): Payer: 59 | Admitting: *Deleted

## 2020-02-25 DIAGNOSIS — Z950 Presence of cardiac pacemaker: Secondary | ICD-10-CM

## 2020-02-27 LAB — CUP PACEART REMOTE DEVICE CHECK
Battery Remaining Longevity: 62 mo
Battery Voltage: 2.95 V
Brady Statistic AP VP Percent: 80.36 %
Brady Statistic AP VS Percent: 0.32 %
Brady Statistic AS VP Percent: 18.51 %
Brady Statistic AS VS Percent: 0.8 %
Brady Statistic RA Percent Paced: 79.91 %
Brady Statistic RV Percent Paced: 98.73 %
Date Time Interrogation Session: 20210420205413
Implantable Lead Implant Date: 20180111
Implantable Lead Implant Date: 20180111
Implantable Lead Implant Date: 20180111
Implantable Lead Location: 753858
Implantable Lead Location: 753859
Implantable Lead Location: 753860
Implantable Lead Model: 4398
Implantable Lead Model: 5076
Implantable Lead Model: 5076
Implantable Pulse Generator Implant Date: 20180111
Lead Channel Impedance Value: 342 Ohm
Lead Channel Impedance Value: 361 Ohm
Lead Channel Impedance Value: 475 Ohm
Lead Channel Impedance Value: 513 Ohm
Lead Channel Impedance Value: 532 Ohm
Lead Channel Impedance Value: 589 Ohm
Lead Channel Impedance Value: 703 Ohm
Lead Channel Impedance Value: 779 Ohm
Lead Channel Impedance Value: 988 Ohm
Lead Channel Pacing Threshold Amplitude: 0.625 V
Lead Channel Pacing Threshold Amplitude: 0.75 V
Lead Channel Pacing Threshold Amplitude: 0.875 V
Lead Channel Pacing Threshold Pulse Width: 0.4 ms
Lead Channel Pacing Threshold Pulse Width: 0.4 ms
Lead Channel Pacing Threshold Pulse Width: 1 ms
Lead Channel Sensing Intrinsic Amplitude: 2.875 mV
Lead Channel Sensing Intrinsic Amplitude: 2.875 mV
Lead Channel Sensing Intrinsic Amplitude: 4.875 mV
Lead Channel Sensing Intrinsic Amplitude: 4.875 mV
Lead Channel Setting Pacing Amplitude: 1.5 V
Lead Channel Setting Pacing Amplitude: 2 V
Lead Channel Setting Pacing Amplitude: 2.5 V
Lead Channel Setting Pacing Pulse Width: 0.4 ms
Lead Channel Setting Pacing Pulse Width: 1 ms
Lead Channel Setting Sensing Sensitivity: 2 mV

## 2020-02-27 NOTE — Progress Notes (Signed)
PPM Remote  
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
## 2020-03-12 ENCOUNTER — Encounter: Payer: Self-pay | Admitting: Cardiology

## 2020-03-12 ENCOUNTER — Ambulatory Visit: Payer: 59 | Admitting: Cardiology

## 2020-03-12 ENCOUNTER — Other Ambulatory Visit: Payer: Self-pay

## 2020-03-12 VITALS — BP 130/80 | HR 70 | Temp 97.1°F | Ht 70.0 in | Wt 179.4 lb

## 2020-03-12 DIAGNOSIS — I5022 Chronic systolic (congestive) heart failure: Secondary | ICD-10-CM | POA: Diagnosis not present

## 2020-03-12 DIAGNOSIS — Z952 Presence of prosthetic heart valve: Secondary | ICD-10-CM | POA: Diagnosis not present

## 2020-03-12 DIAGNOSIS — I480 Paroxysmal atrial fibrillation: Secondary | ICD-10-CM

## 2020-03-12 DIAGNOSIS — I442 Atrioventricular block, complete: Secondary | ICD-10-CM

## 2020-04-27 ENCOUNTER — Other Ambulatory Visit: Payer: Self-pay | Admitting: Cardiology

## 2020-05-26 ENCOUNTER — Ambulatory Visit (INDEPENDENT_AMBULATORY_CARE_PROVIDER_SITE_OTHER): Payer: 59 | Admitting: *Deleted

## 2020-05-26 DIAGNOSIS — I442 Atrioventricular block, complete: Secondary | ICD-10-CM

## 2020-05-26 LAB — CUP PACEART REMOTE DEVICE CHECK
Battery Remaining Longevity: 56 mo
Battery Voltage: 2.95 V
Brady Statistic AP VP Percent: 73.67 %
Brady Statistic AP VS Percent: 0.3 %
Brady Statistic AS VP Percent: 25.26 %
Brady Statistic AS VS Percent: 0.76 %
Brady Statistic RA Percent Paced: 73.21 %
Brady Statistic RV Percent Paced: 98.64 %
Date Time Interrogation Session: 20210719020532
Implantable Lead Implant Date: 20180111
Implantable Lead Implant Date: 20180111
Implantable Lead Implant Date: 20180111
Implantable Lead Location: 753858
Implantable Lead Location: 753859
Implantable Lead Location: 753860
Implantable Lead Model: 4398
Implantable Lead Model: 5076
Implantable Lead Model: 5076
Implantable Pulse Generator Implant Date: 20180111
Lead Channel Impedance Value: 323 Ohm
Lead Channel Impedance Value: 323 Ohm
Lead Channel Impedance Value: 456 Ohm
Lead Channel Impedance Value: 475 Ohm
Lead Channel Impedance Value: 475 Ohm
Lead Channel Impedance Value: 551 Ohm
Lead Channel Impedance Value: 665 Ohm
Lead Channel Impedance Value: 741 Ohm
Lead Channel Impedance Value: 893 Ohm
Lead Channel Pacing Threshold Amplitude: 0.5 V
Lead Channel Pacing Threshold Amplitude: 0.625 V
Lead Channel Pacing Threshold Amplitude: 0.75 V
Lead Channel Pacing Threshold Pulse Width: 0.4 ms
Lead Channel Pacing Threshold Pulse Width: 0.4 ms
Lead Channel Pacing Threshold Pulse Width: 1 ms
Lead Channel Sensing Intrinsic Amplitude: 2.5 mV
Lead Channel Sensing Intrinsic Amplitude: 2.5 mV
Lead Channel Sensing Intrinsic Amplitude: 3.875 mV
Lead Channel Sensing Intrinsic Amplitude: 3.875 mV
Lead Channel Setting Pacing Amplitude: 1.25 V
Lead Channel Setting Pacing Amplitude: 2 V
Lead Channel Setting Pacing Amplitude: 2.5 V
Lead Channel Setting Pacing Pulse Width: 0.4 ms
Lead Channel Setting Pacing Pulse Width: 1 ms
Lead Channel Setting Sensing Sensitivity: 2 mV

## 2020-05-28 NOTE — Progress Notes (Signed)
Remote pacemaker transmission.   

## 2020-08-25 ENCOUNTER — Ambulatory Visit (INDEPENDENT_AMBULATORY_CARE_PROVIDER_SITE_OTHER): Payer: 59

## 2020-08-25 DIAGNOSIS — I442 Atrioventricular block, complete: Secondary | ICD-10-CM

## 2020-08-26 LAB — CUP PACEART REMOTE DEVICE CHECK
Battery Remaining Longevity: 46 mo
Battery Voltage: 2.94 V
Brady Statistic AP VP Percent: 77.9 %
Brady Statistic AP VS Percent: 0.24 %
Brady Statistic AS VP Percent: 20.51 %
Brady Statistic AS VS Percent: 1.36 %
Brady Statistic RA Percent Paced: 77.05 %
Brady Statistic RV Percent Paced: 98.23 %
Date Time Interrogation Session: 20211018020510
Implantable Lead Implant Date: 20180111
Implantable Lead Implant Date: 20180111
Implantable Lead Implant Date: 20180111
Implantable Lead Location: 753858
Implantable Lead Location: 753859
Implantable Lead Location: 753860
Implantable Lead Model: 4398
Implantable Lead Model: 5076
Implantable Lead Model: 5076
Implantable Pulse Generator Implant Date: 20180111
Lead Channel Impedance Value: 342 Ohm
Lead Channel Impedance Value: 342 Ohm
Lead Channel Impedance Value: 456 Ohm
Lead Channel Impedance Value: 475 Ohm
Lead Channel Impedance Value: 513 Ohm
Lead Channel Impedance Value: 570 Ohm
Lead Channel Impedance Value: 665 Ohm
Lead Channel Impedance Value: 741 Ohm
Lead Channel Impedance Value: 950 Ohm
Lead Channel Pacing Threshold Amplitude: 0.5 V
Lead Channel Pacing Threshold Amplitude: 0.625 V
Lead Channel Pacing Threshold Amplitude: 1 V
Lead Channel Pacing Threshold Pulse Width: 0.4 ms
Lead Channel Pacing Threshold Pulse Width: 0.4 ms
Lead Channel Pacing Threshold Pulse Width: 1 ms
Lead Channel Sensing Intrinsic Amplitude: 2.625 mV
Lead Channel Sensing Intrinsic Amplitude: 2.625 mV
Lead Channel Sensing Intrinsic Amplitude: 5.875 mV
Lead Channel Sensing Intrinsic Amplitude: 5.875 mV
Lead Channel Setting Pacing Amplitude: 1.5 V
Lead Channel Setting Pacing Amplitude: 2 V
Lead Channel Setting Pacing Amplitude: 2.5 V
Lead Channel Setting Pacing Pulse Width: 0.4 ms
Lead Channel Setting Pacing Pulse Width: 1 ms
Lead Channel Setting Sensing Sensitivity: 2 mV

## 2020-08-28 NOTE — Progress Notes (Signed)
Remote pacemaker transmission.   

## 2020-11-24 ENCOUNTER — Ambulatory Visit (INDEPENDENT_AMBULATORY_CARE_PROVIDER_SITE_OTHER): Payer: 59

## 2020-11-24 DIAGNOSIS — I442 Atrioventricular block, complete: Secondary | ICD-10-CM

## 2020-11-25 LAB — CUP PACEART REMOTE DEVICE CHECK
Battery Remaining Longevity: 42 mo
Battery Voltage: 2.94 V
Brady Statistic AP VP Percent: 73.54 %
Brady Statistic AP VS Percent: 0.84 %
Brady Statistic AS VP Percent: 19.96 %
Brady Statistic AS VS Percent: 5.66 %
Brady Statistic RA Percent Paced: 75.1 %
Brady Statistic RV Percent Paced: 93.43 %
Date Time Interrogation Session: 20220117010556
Implantable Lead Implant Date: 20180111
Implantable Lead Implant Date: 20180111
Implantable Lead Implant Date: 20180111
Implantable Lead Location: 753858
Implantable Lead Location: 753859
Implantable Lead Location: 753860
Implantable Lead Model: 4398
Implantable Lead Model: 5076
Implantable Lead Model: 5076
Implantable Pulse Generator Implant Date: 20180111
Lead Channel Impedance Value: 1007 Ohm
Lead Channel Impedance Value: 342 Ohm
Lead Channel Impedance Value: 361 Ohm
Lead Channel Impedance Value: 475 Ohm
Lead Channel Impedance Value: 513 Ohm
Lead Channel Impedance Value: 532 Ohm
Lead Channel Impedance Value: 627 Ohm
Lead Channel Impedance Value: 703 Ohm
Lead Channel Impedance Value: 798 Ohm
Lead Channel Pacing Threshold Amplitude: 0.625 V
Lead Channel Pacing Threshold Amplitude: 0.75 V
Lead Channel Pacing Threshold Amplitude: 0.875 V
Lead Channel Pacing Threshold Pulse Width: 0.4 ms
Lead Channel Pacing Threshold Pulse Width: 0.4 ms
Lead Channel Pacing Threshold Pulse Width: 1 ms
Lead Channel Sensing Intrinsic Amplitude: 2.75 mV
Lead Channel Sensing Intrinsic Amplitude: 2.75 mV
Lead Channel Sensing Intrinsic Amplitude: 5 mV
Lead Channel Sensing Intrinsic Amplitude: 5 mV
Lead Channel Setting Pacing Amplitude: 1.25 V
Lead Channel Setting Pacing Amplitude: 2 V
Lead Channel Setting Pacing Amplitude: 2.5 V
Lead Channel Setting Pacing Pulse Width: 0.4 ms
Lead Channel Setting Pacing Pulse Width: 1 ms
Lead Channel Setting Sensing Sensitivity: 2 mV
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
## 2020-12-06 NOTE — Progress Notes (Signed)
Remote pacemaker transmission.   

## 2020-12-08 ENCOUNTER — Ambulatory Visit: Payer: 59 | Admitting: Cardiology

## 2020-12-31 ENCOUNTER — Other Ambulatory Visit (HOSPITAL_COMMUNITY): Payer: Self-pay | Admitting: Internal Medicine

## 2020-12-31 ENCOUNTER — Ambulatory Visit: Payer: 59 | Attending: Internal Medicine

## 2020-12-31 DIAGNOSIS — Z23 Encounter for immunization: Secondary | ICD-10-CM

## 2020-12-31 NOTE — Progress Notes (Signed)
   Covid-19 Vaccination Clinic  Name:  [name] [name]    MRN: [id number] DOB: 11/30/1955  12/31/2020  [name] was observed post Covid-19 immunization for 15 minutes without incident. He was provided with Vaccine Information Sheet and instruction to access the V-Safe system.   [name] was instructed to call 911 with any severe reactions post vaccine: . Difficulty breathing  . Swelling of face and throat  . A fast heartbeat  . A bad rash all over body  . Dizziness and weakness   Immunizations Administered    Name Date Dose VIS Date Route   PFIZER Comrnaty(Gray TOP) Covid-19 Vaccine 12/31/2020  9:03 AM 0.3 mL 10/16/2020 Intramuscular   Manufacturer: Pfizer[employer] Inc   Lot: [id number]   NDC: [address]-2      

## 2020-12-31 NOTE — Progress Notes (Signed)
   Covid-19 Vaccination Clinic  Name:  [name] [name]    MRN: [id number] DOB: [birth year]  12/31/2020  [name] was observed post Covid-19 immunization for 15 minutes without incident. He was provided with Vaccine Information Sheet and instruction to access the V-Safe system.   [name] was instructed to call 911 with any severe reactions post vaccine: Marland Kitchen Difficulty breathing  . Swelling of face and throat  . A fast heartbeat  . A bad rash all over body  . Dizziness and weakness   Immunizations Administered    Name Date Dose VIS Date Route   PFIZER Comrnaty(Gray TOP) Covid-19 Vaccine 12/31/2020  9:03 AM 0.3 mL 10/16/2020 Intramuscular   Manufacturer: [employer]   Lot: [id number]   NDC: [phone]
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
## 2021-02-12 ENCOUNTER — Ambulatory Visit (INDEPENDENT_AMBULATORY_CARE_PROVIDER_SITE_OTHER): Payer: 59 | Admitting: Cardiology

## 2021-02-12 ENCOUNTER — Other Ambulatory Visit: Payer: Self-pay

## 2021-02-12 ENCOUNTER — Encounter: Payer: Self-pay | Admitting: Cardiology

## 2021-02-12 VITALS — BP 118/56 | HR 76 | Ht 70.0 in | Wt 185.2 lb

## 2021-02-12 DIAGNOSIS — I442 Atrioventricular block, complete: Secondary | ICD-10-CM

## 2021-02-12 DIAGNOSIS — Z952 Presence of prosthetic heart valve: Secondary | ICD-10-CM | POA: Diagnosis not present

## 2021-02-12 DIAGNOSIS — I480 Paroxysmal atrial fibrillation: Secondary | ICD-10-CM | POA: Diagnosis not present

## 2021-02-12 DIAGNOSIS — Z950 Presence of cardiac pacemaker: Secondary | ICD-10-CM | POA: Diagnosis not present

## 2021-02-23 ENCOUNTER — Ambulatory Visit (INDEPENDENT_AMBULATORY_CARE_PROVIDER_SITE_OTHER): Payer: 59

## 2021-02-23 DIAGNOSIS — I442 Atrioventricular block, complete: Secondary | ICD-10-CM

## 2021-02-24 LAB — CUP PACEART REMOTE DEVICE CHECK
Battery Remaining Longevity: 35 mo
Battery Voltage: 2.93 V
Brady Statistic AP VP Percent: 75.46 %
Brady Statistic AP VS Percent: 0.27 %
Brady Statistic AS VP Percent: 23 %
Brady Statistic AS VS Percent: 1.27 %
Brady Statistic RA Percent Paced: 74.43 %
Brady Statistic RV Percent Paced: 98.26 %
Date Time Interrogation Session: 20220418020902
Implantable Lead Implant Date: 20180111
Implantable Lead Implant Date: 20180111
Implantable Lead Implant Date: 20180111
Implantable Lead Location: 753858
Implantable Lead Location: 753859
Implantable Lead Location: 753860
Implantable Lead Model: 4398
Implantable Lead Model: 5076
Implantable Lead Model: 5076
Implantable Pulse Generator Implant Date: 20180111
Lead Channel Impedance Value: 342 Ohm
Lead Channel Impedance Value: 342 Ohm
Lead Channel Impedance Value: 456 Ohm
Lead Channel Impedance Value: 494 Ohm
Lead Channel Impedance Value: 494 Ohm
Lead Channel Impedance Value: 589 Ohm
Lead Channel Impedance Value: 684 Ohm
Lead Channel Impedance Value: 760 Ohm
Lead Channel Impedance Value: 969 Ohm
Lead Channel Pacing Threshold Amplitude: 0.625 V
Lead Channel Pacing Threshold Amplitude: 0.875 V
Lead Channel Pacing Threshold Amplitude: 1 V
Lead Channel Pacing Threshold Pulse Width: 0.4 ms
Lead Channel Pacing Threshold Pulse Width: 0.4 ms
Lead Channel Pacing Threshold Pulse Width: 1 ms
Lead Channel Sensing Intrinsic Amplitude: 2.5 mV
Lead Channel Sensing Intrinsic Amplitude: 2.5 mV
Lead Channel Sensing Intrinsic Amplitude: 4.5 mV
Lead Channel Sensing Intrinsic Amplitude: 4.5 mV
Lead Channel Setting Pacing Amplitude: 1.5 V
Lead Channel Setting Pacing Amplitude: 2 V
Lead Channel Setting Pacing Amplitude: 2.5 V
Lead Channel Setting Pacing Pulse Width: 0.4 ms
Lead Channel Setting Pacing Pulse Width: 1 ms
Lead Channel Setting Sensing Sensitivity: 2 mV

## 2021-03-02 ENCOUNTER — Other Ambulatory Visit: Payer: Self-pay | Admitting: Cardiology

## 2021-03-11 NOTE — Progress Notes (Signed)
Remote pacemaker transmission.   

## 2021-05-25 ENCOUNTER — Ambulatory Visit (INDEPENDENT_AMBULATORY_CARE_PROVIDER_SITE_OTHER): Payer: 59

## 2021-05-25 DIAGNOSIS — I442 Atrioventricular block, complete: Secondary | ICD-10-CM

## 2021-05-26 LAB — CUP PACEART REMOTE DEVICE CHECK
Battery Remaining Longevity: 32 mo
Battery Voltage: 2.92 V
Brady Statistic AP VP Percent: 68.82 %
Brady Statistic AP VS Percent: 0.14 %
Brady Statistic AS VP Percent: 29.41 %
Brady Statistic AS VS Percent: 1.62 %
Brady Statistic RA Percent Paced: 67.84 %
Brady Statistic RV Percent Paced: 98.08 %
Date Time Interrogation Session: 20220718020926
Implantable Lead Implant Date: 20180111
Implantable Lead Implant Date: 20180111
Implantable Lead Implant Date: 20180111
Implantable Lead Location: 753858
Implantable Lead Location: 753859
Implantable Lead Location: 753860
Implantable Lead Model: 4398
Implantable Lead Model: 5076
Implantable Lead Model: 5076
Implantable Pulse Generator Implant Date: 20180111
Lead Channel Impedance Value: 323 Ohm
Lead Channel Impedance Value: 342 Ohm
Lead Channel Impedance Value: 456 Ohm
Lead Channel Impedance Value: 475 Ohm
Lead Channel Impedance Value: 475 Ohm
Lead Channel Impedance Value: 570 Ohm
Lead Channel Impedance Value: 627 Ohm
Lead Channel Impedance Value: 741 Ohm
Lead Channel Impedance Value: 912 Ohm
Lead Channel Pacing Threshold Amplitude: 0.625 V
Lead Channel Pacing Threshold Amplitude: 0.875 V
Lead Channel Pacing Threshold Amplitude: 1 V
Lead Channel Pacing Threshold Pulse Width: 0.4 ms
Lead Channel Pacing Threshold Pulse Width: 0.4 ms
Lead Channel Pacing Threshold Pulse Width: 1 ms
Lead Channel Sensing Intrinsic Amplitude: 2.25 mV
Lead Channel Sensing Intrinsic Amplitude: 2.25 mV
Lead Channel Sensing Intrinsic Amplitude: 3.5 mV
Lead Channel Sensing Intrinsic Amplitude: 3.5 mV
Lead Channel Setting Pacing Amplitude: 1.5 V
Lead Channel Setting Pacing Amplitude: 2 V
Lead Channel Setting Pacing Amplitude: 2.5 V
Lead Channel Setting Pacing Pulse Width: 0.4 ms
Lead Channel Setting Pacing Pulse Width: 1 ms
Lead Channel Setting Sensing Sensitivity: 2 mV

## 2021-06-17 NOTE — Progress Notes (Signed)
Remote pacemaker transmission.   

## 2021-08-24 ENCOUNTER — Ambulatory Visit (INDEPENDENT_AMBULATORY_CARE_PROVIDER_SITE_OTHER): Payer: 59

## 2021-08-24 DIAGNOSIS — I442 Atrioventricular block, complete: Secondary | ICD-10-CM

## 2021-08-25 LAB — CUP PACEART REMOTE DEVICE CHECK
Battery Remaining Longevity: 30 mo
Battery Voltage: 2.92 V
Brady Statistic AP VP Percent: 70.04 %
Brady Statistic AP VS Percent: 0.1 %
Brady Statistic AS VP Percent: 28.28 %
Brady Statistic AS VS Percent: 1.58 %
Brady Statistic RA Percent Paced: 67.85 %
Brady Statistic RV Percent Paced: 98.05 %
Date Time Interrogation Session: 20221017020832
Implantable Lead Implant Date: 20180111
Implantable Lead Implant Date: 20180111
Implantable Lead Implant Date: 20180111
Implantable Lead Location: 753858
Implantable Lead Location: 753859
Implantable Lead Location: 753860
Implantable Lead Model: 4398
Implantable Lead Model: 5076
Implantable Lead Model: 5076
Implantable Pulse Generator Implant Date: 20180111
Lead Channel Impedance Value: 323 Ohm
Lead Channel Impedance Value: 323 Ohm
Lead Channel Impedance Value: 456 Ohm
Lead Channel Impedance Value: 475 Ohm
Lead Channel Impedance Value: 494 Ohm
Lead Channel Impedance Value: 551 Ohm
Lead Channel Impedance Value: 684 Ohm
Lead Channel Impedance Value: 741 Ohm
Lead Channel Impedance Value: 874 Ohm
Lead Channel Pacing Threshold Amplitude: 0.5 V
Lead Channel Pacing Threshold Amplitude: 0.75 V
Lead Channel Pacing Threshold Amplitude: 0.875 V
Lead Channel Pacing Threshold Pulse Width: 0.4 ms
Lead Channel Pacing Threshold Pulse Width: 0.4 ms
Lead Channel Pacing Threshold Pulse Width: 1 ms
Lead Channel Sensing Intrinsic Amplitude: 2.625 mV
Lead Channel Sensing Intrinsic Amplitude: 2.625 mV
Lead Channel Sensing Intrinsic Amplitude: 5.25 mV
Lead Channel Sensing Intrinsic Amplitude: 5.25 mV
Lead Channel Setting Pacing Amplitude: 1.5 V
Lead Channel Setting Pacing Amplitude: 2 V
Lead Channel Setting Pacing Amplitude: 2.5 V
Lead Channel Setting Pacing Pulse Width: 0.4 ms
Lead Channel Setting Pacing Pulse Width: 1 ms
Lead Channel Setting Sensing Sensitivity: 2 mV

## 2021-09-01 NOTE — Progress Notes (Signed)
Remote pacemaker transmission.   

## 2021-11-23 ENCOUNTER — Ambulatory Visit (INDEPENDENT_AMBULATORY_CARE_PROVIDER_SITE_OTHER): Payer: Managed Care, Other (non HMO)

## 2021-11-23 DIAGNOSIS — I442 Atrioventricular block, complete: Secondary | ICD-10-CM

## 2021-11-23 LAB — CUP PACEART REMOTE DEVICE CHECK
Battery Remaining Longevity: 30 mo
Battery Voltage: 2.91 V
Brady Statistic AP VP Percent: 73.1 %
Brady Statistic AP VS Percent: 0.14 %
Brady Statistic AS VP Percent: 25.11 %
Brady Statistic AS VS Percent: 1.65 %
Brady Statistic RA Percent Paced: 72.46 %
Brady Statistic RV Percent Paced: 98.08 %
Date Time Interrogation Session: 20230116011040
Implantable Lead Implant Date: 20180111
Implantable Lead Implant Date: 20180111
Implantable Lead Implant Date: 20180111
Implantable Lead Location: 753858
Implantable Lead Location: 753859
Implantable Lead Location: 753860
Implantable Lead Model: 4398
Implantable Lead Model: 5076
Implantable Lead Model: 5076
Implantable Pulse Generator Implant Date: 20180111
Lead Channel Impedance Value: 342 Ohm
Lead Channel Impedance Value: 342 Ohm
Lead Channel Impedance Value: 456 Ohm
Lead Channel Impedance Value: 494 Ohm
Lead Channel Impedance Value: 494 Ohm
Lead Channel Impedance Value: 551 Ohm
Lead Channel Impedance Value: 646 Ohm
Lead Channel Impedance Value: 741 Ohm
Lead Channel Impedance Value: 912 Ohm
Lead Channel Pacing Threshold Amplitude: 0.625 V
Lead Channel Pacing Threshold Amplitude: 0.875 V
Lead Channel Pacing Threshold Amplitude: 0.875 V
Lead Channel Pacing Threshold Pulse Width: 0.4 ms
Lead Channel Pacing Threshold Pulse Width: 0.4 ms
Lead Channel Pacing Threshold Pulse Width: 1 ms
Lead Channel Sensing Intrinsic Amplitude: 2.25 mV
Lead Channel Sensing Intrinsic Amplitude: 2.25 mV
Lead Channel Sensing Intrinsic Amplitude: 6.375 mV
Lead Channel Sensing Intrinsic Amplitude: 6.375 mV
Lead Channel Setting Pacing Amplitude: 1.5 V
Lead Channel Setting Pacing Amplitude: 2 V
Lead Channel Setting Pacing Amplitude: 2.5 V
Lead Channel Setting Pacing Pulse Width: 0.4 ms
Lead Channel Setting Pacing Pulse Width: 1 ms
Lead Channel Setting Sensing Sensitivity: 2 mV

## 2021-12-03 NOTE — Progress Notes (Signed)
Remote pacemaker transmission.   

## 2021-12-11 DIAGNOSIS — Z125 Encounter for screening for malignant neoplasm of prostate: Secondary | ICD-10-CM | POA: Diagnosis not present

## 2021-12-11 DIAGNOSIS — Z7901 Long term (current) use of anticoagulants: Secondary | ICD-10-CM | POA: Diagnosis not present

## 2021-12-15 ENCOUNTER — Telehealth: Payer: Self-pay

## 2021-12-15 NOTE — Telephone Encounter (Signed)
-----   Message from Clide Dales sent at 12/15/2021 11:12 AM EST ----- Hi ladies,  I received a phone call from this patient's wife today.  She states that he is not supposed to be having remote device checks anymore. (??)  Please call her to verify this. She thinks the charges are automatically dropping without a report being sent from his monitor.  Her name is Quy Lotts and phone number is [phone].  Please let me know is the October 2022 & January 2023 charges should be adjusted.  Thanks [employer] Patient Accounts

## 2021-12-15 NOTE — Telephone Encounter (Signed)
I spoke with the patient wife [name] and explained to her that his monitor is automatic. He do not need to press the button to send a transmission. I let her know he only have to press the button if the nurse call and ask for it. I let her know that the monitor is working properly and sending automatically like it supposed to. She states no one has never explained it to her and now that I have, she has a better understanding and is okay with it being billed. She verbalized understanding and thanked me for the call.

## 2021-12-19 DIAGNOSIS — L089 Local infection of the skin and subcutaneous tissue, unspecified: Secondary | ICD-10-CM | POA: Diagnosis not present

## 2021-12-25 DIAGNOSIS — M719 Bursopathy, unspecified: Secondary | ICD-10-CM | POA: Diagnosis not present

## 2022-01-08 DIAGNOSIS — Z7901 Long term (current) use of anticoagulants: Secondary | ICD-10-CM | POA: Diagnosis not present

## 2022-01-18 DIAGNOSIS — Z7901 Long term (current) use of anticoagulants: Secondary | ICD-10-CM | POA: Diagnosis not present

## 2022-01-25 DIAGNOSIS — Z7901 Long term (current) use of anticoagulants: Secondary | ICD-10-CM | POA: Diagnosis not present

## 2022-01-27 DIAGNOSIS — R319 Hematuria, unspecified: Secondary | ICD-10-CM | POA: Diagnosis not present

## 2022-01-31 ENCOUNTER — Encounter (HOSPITAL_BASED_OUTPATIENT_CLINIC_OR_DEPARTMENT_OTHER): Payer: Self-pay | Admitting: Obstetrics and Gynecology

## 2022-01-31 ENCOUNTER — Other Ambulatory Visit: Payer: Self-pay

## 2022-01-31 ENCOUNTER — Inpatient Hospital Stay (HOSPITAL_BASED_OUTPATIENT_CLINIC_OR_DEPARTMENT_OTHER)
Admission: EM | Admit: 2022-01-31 | Discharge: 2022-02-02 | DRG: 872 | Disposition: A | Discharge status: Home / Self Care | Payer: Medicare HMO | Attending: Internal Medicine | Admitting: Internal Medicine

## 2022-01-31 DIAGNOSIS — E871 Hypo-osmolality and hyponatremia: Secondary | ICD-10-CM | POA: Diagnosis present

## 2022-01-31 DIAGNOSIS — I110 Hypertensive heart disease with heart failure: Secondary | ICD-10-CM | POA: Diagnosis not present

## 2022-01-31 DIAGNOSIS — A4151 Sepsis due to Escherichia coli [E. coli]: Principal | ICD-10-CM | POA: Diagnosis present

## 2022-01-31 DIAGNOSIS — D72829 Elevated white blood cell count, unspecified: Secondary | ICD-10-CM | POA: Diagnosis not present

## 2022-01-31 DIAGNOSIS — I428 Other cardiomyopathies: Secondary | ICD-10-CM | POA: Diagnosis not present

## 2022-01-31 DIAGNOSIS — N3001 Acute cystitis with hematuria: Secondary | ICD-10-CM | POA: Diagnosis not present

## 2022-01-31 DIAGNOSIS — Z7982 Long term (current) use of aspirin: Secondary | ICD-10-CM | POA: Diagnosis not present

## 2022-01-31 DIAGNOSIS — Z952 Presence of prosthetic heart valve: Secondary | ICD-10-CM | POA: Diagnosis not present

## 2022-01-31 DIAGNOSIS — N390 Urinary tract infection, site not specified: Secondary | ICD-10-CM | POA: Diagnosis not present

## 2022-01-31 DIAGNOSIS — Z950 Presence of cardiac pacemaker: Secondary | ICD-10-CM

## 2022-01-31 DIAGNOSIS — I442 Atrioventricular block, complete: Secondary | ICD-10-CM | POA: Diagnosis not present

## 2022-01-31 DIAGNOSIS — Z79899 Other long term (current) drug therapy: Secondary | ICD-10-CM | POA: Diagnosis not present

## 2022-01-31 DIAGNOSIS — I5022 Chronic systolic (congestive) heart failure: Secondary | ICD-10-CM | POA: Diagnosis present

## 2022-01-31 DIAGNOSIS — A419 Sepsis, unspecified organism: Secondary | ICD-10-CM | POA: Diagnosis not present

## 2022-01-31 DIAGNOSIS — Z7901 Long term (current) use of anticoagulants: Secondary | ICD-10-CM

## 2022-01-31 DIAGNOSIS — I4819 Other persistent atrial fibrillation: Secondary | ICD-10-CM | POA: Diagnosis not present

## 2022-01-31 DIAGNOSIS — M170 Bilateral primary osteoarthritis of knee: Secondary | ICD-10-CM | POA: Diagnosis present

## 2022-01-31 DIAGNOSIS — R531 Weakness: Secondary | ICD-10-CM | POA: Diagnosis not present

## 2022-01-31 DIAGNOSIS — D649 Anemia, unspecified: Secondary | ICD-10-CM

## 2022-01-31 DIAGNOSIS — E872 Acidosis, unspecified: Secondary | ICD-10-CM | POA: Diagnosis not present

## 2022-01-31 DIAGNOSIS — Z809 Family history of malignant neoplasm, unspecified: Secondary | ICD-10-CM | POA: Diagnosis not present

## 2022-01-31 LAB — CBC WITH DIFFERENTIAL/PLATELET
Abs Immature Granulocytes: 0.1 10*3/uL — ABNORMAL HIGH (ref 0.00–0.07)
Basophils Absolute: 0.1 10*3/uL (ref 0.0–0.1)
Basophils Relative: 1 %
Eosinophils Absolute: 0 10*3/uL (ref 0.0–0.5)
Eosinophils Relative: 0 %
HCT: 40.7 % (ref 39.0–52.0)
Hemoglobin: 13.3 g/dL (ref 13.0–17.0)
Immature Granulocytes: 0 %
Lymphocytes Relative: 7 %
Lymphs Abs: 1.7 10*3/uL (ref 0.7–4.0)
MCH: 29.8 pg (ref 26.0–34.0)
MCHC: 32.7 g/dL (ref 30.0–36.0)
MCV: 91.3 fL (ref 80.0–100.0)
Monocytes Absolute: 1.8 10*3/uL — ABNORMAL HIGH (ref 0.1–1.0)
Monocytes Relative: 8 %
Neutro Abs: 19.3 10*3/uL — ABNORMAL HIGH (ref 1.7–7.7)
Neutrophils Relative %: 84 %
Platelets: 376 10*3/uL (ref 150–400)
RBC: 4.46 MIL/uL (ref 4.22–5.81)
RDW: 13.9 % (ref 11.5–15.5)
WBC: 23 10*3/uL — ABNORMAL HIGH (ref 4.0–10.5)
nRBC: 0 % (ref 0.0–0.2)

## 2022-01-31 LAB — COMPREHENSIVE METABOLIC PANEL
ALT: 15 U/L (ref 0–44)
AST: 16 U/L (ref 15–41)
Albumin: 4.1 g/dL (ref 3.5–5.0)
Alkaline Phosphatase: 73 U/L (ref 38–126)
Anion gap: 8 (ref 5–15)
BUN: 13 mg/dL (ref 8–23)
CO2: 24 mmol/L (ref 22–32)
Calcium: 9.6 mg/dL (ref 8.9–10.3)
Chloride: 100 mmol/L (ref 98–111)
Creatinine, Ser: 0.99 mg/dL (ref 0.61–1.24)
GFR, Estimated: >60 mL/min (ref >60)
Glucose, Bld: 133 mg/dL — ABNORMAL HIGH (ref 70–99)
Potassium: 3.9 mmol/L (ref 3.5–5.1)
Sodium: 132 mmol/L — ABNORMAL LOW (ref 135–145)
Total Bilirubin: 0.8 mg/dL (ref 0.3–1.2)
Total Protein: 7.5 g/dL (ref 6.5–8.1)

## 2022-01-31 LAB — URINALYSIS, ROUTINE W REFLEX MICROSCOPIC
Bilirubin Urine: NEGATIVE
Glucose, UA: NEGATIVE mg/dL
Nitrite: NEGATIVE
Protein, ur: 30 mg/dL — AB
RBC / HPF: >50 RBC/hpf — ABNORMAL HIGH (ref 0–5)
Specific Gravity, Urine: 1.032 — ABNORMAL HIGH (ref 1.005–1.030)
WBC, UA: >50 WBC/hpf — ABNORMAL HIGH (ref 0–5)
pH: 5.5 (ref 5.0–8.0)

## 2022-01-31 LAB — LACTIC ACID, PLASMA
Lactic Acid, Venous: 2.3 mmol/L (ref 0.5–1.9)
Lactic Acid, Venous: 1 mmol/L (ref 0.5–1.9)

## 2022-01-31 LAB — PROTIME-INR
INR: 2.2 — ABNORMAL HIGH (ref 0.8–1.2)
Prothrombin Time: 24.5 seconds — ABNORMAL HIGH (ref 11.4–15.2)

## 2022-01-31 MED ORDER — ACETAMINOPHEN 650 MG RE SUPP: 650.0000 mg | Freq: Four times a day (QID) | RECTAL | Status: DC | PRN

## 2022-01-31 MED ORDER — WARFARIN - PHARMACIST DOSING INPATIENT: Freq: Every day | Status: DC

## 2022-01-31 MED ORDER — SODIUM CHLORIDE 0.9% FLUSH
3.0000 mL | Freq: Once | INTRAVENOUS | Status: AC
  Administered 2022-01-31: 3 mL via INTRAVENOUS
  Filled 2022-01-31: qty 3

## 2022-01-31 MED ORDER — ONDANSETRON HCL 4 MG/2ML IJ SOLN: 4.0000 mg | Freq: Four times a day (QID) | INTRAMUSCULAR | Status: DC | PRN

## 2022-01-31 MED ORDER — HYDRALAZINE HCL 20 MG/ML IJ SOLN: 5.0000 mg | INTRAMUSCULAR | Status: DC | PRN

## 2022-01-31 MED ORDER — ACETAMINOPHEN 325 MG PO TABS: 650.0000 mg | ORAL_TABLET | Freq: Four times a day (QID) | ORAL | Status: DC | PRN

## 2022-01-31 MED ORDER — SODIUM CHLORIDE 0.9 % IV BOLUS
500.0000 mL | Freq: Once | INTRAVENOUS | Status: AC
  Administered 2022-01-31: 500 mL via INTRAVENOUS

## 2022-01-31 MED ORDER — ASPIRIN EC 81 MG PO TBEC
81.0000 mg | DELAYED_RELEASE_TABLET | Freq: Every day | ORAL | Status: DC
  Administered 2022-02-01 – 2022-02-02 (×2): 81 mg via ORAL
  Filled 2022-01-31 (×2): qty 1

## 2022-01-31 MED ORDER — AZELASTINE HCL 0.1 % NA SOLN: 1.0000 | Freq: Every day | NASAL | Status: DC | PRN

## 2022-01-31 MED ORDER — ONDANSETRON HCL 4 MG PO TABS: 4.0000 mg | ORAL_TABLET | Freq: Four times a day (QID) | ORAL | Status: DC | PRN

## 2022-01-31 MED ORDER — SODIUM CHLORIDE 0.9 % IV SOLN
1.0000 g | INTRAVENOUS | Status: DC
  Administered 2022-02-01: 1 g via INTRAVENOUS
  Filled 2022-01-31 (×2): qty 10

## 2022-01-31 MED ORDER — LISINOPRIL 10 MG PO TABS
10.0000 mg | ORAL_TABLET | Freq: Every morning | ORAL | Status: DC
  Administered 2022-02-01 – 2022-02-02 (×2): 10 mg via ORAL
  Filled 2022-01-31 (×2): qty 1

## 2022-01-31 MED ORDER — SODIUM CHLORIDE 0.9 % IV SOLN
1.0000 g | Freq: Once | INTRAVENOUS | Status: AC
  Administered 2022-01-31: 1 g via INTRAVENOUS
  Filled 2022-01-31: qty 10

## 2022-01-31 NOTE — ED Triage Notes (Signed)
Patient reports he was diagnosed with a UTI last week and he has been having sweats and fatigues and reports he has also has some testicular pain. Patient reports when he urinates he has pain in testicle.  ?
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
## 2022-01-31 NOTE — Progress Notes (Addendum)
ANTICOAGULATION CONSULT NOTE - Follow Up Consult ? ?Pharmacy Consult for warfarin ?Indication: atrial fibrillation ? ?Allergies  ?Allergen Reactions  ? No Known Allergies   ? ? ?Patient Measurements: ?Height: 5\' 10"  (177.8 cm) ?Weight: 84.3 kg (185 lb 12.8 oz) ?IBW/kg (Calculated) : 73 ?Heparin Dosing Weight: 84.3 kg ? ?Vital Signs: ?Temp: 97.8 ?F (36.6 ?C) (03/26 1606) ?Temp Source: Oral (03/26 1606) ?BP: 122/62 (03/26 1606) ?Pulse Rate: 88 (03/26 1606) ? ?Labs: ?Recent Labs  ?  01/31/22 ?1150  ?HGB 13.3  ?HCT 40.7  ?PLT 376  ?CREATININE 0.99  ? ? ?Estimated Creatinine Clearance: 76.8 mL/min (by C-G formula based on SCr of 0.99 mg/dL). ? ? ?Medications:  ?Scheduled:  ? ?Assessment: ?66 yo male presenting with dysuria and weakness.  PMH includes cardiomyopathy, mitral valve repair w/ mechanical prosthetic valve, tricuspid valve repair, and atrial fibrillation on warfarin PTA.   ? ?Discussed with patient - his dose is warfarin 5mg  daily. Patient states his last dose of warfarin was 3/26 ? ?Today, 01/31/22 ?- INR slightly subtherapeutic this evening at 2.2 ?- CBC stable ? ?Goal of Therapy:  ?INR 2.5-3.5 per outpatient anticoag clinic notes (also confirmed w/ patient) ?Monitor platelets by anticoagulation protocol: Yes ?  ?Plan:  ?No warfarin this evening as patient took dose today ?Monitor daily INR, CBC, s/sx bleeding ? ?Dimple Nanas, PharmD ?01/31/2022 4:24 PM ? ?
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
## 2022-01-31 NOTE — Progress Notes (Signed)
Plan of Care Note for accepted transfer ? ? ?Patient: [name] [name] MRN: [id number]   DOA: 01/31/2022 ? ?Facility requesting transfer: [facility]. ?Requesting Provider: Alona Bene, MD. ?Reason for transfer: Sepsis due to UTI. ?Facility course:  ?Per Dr. Jacqulyn Bath ?"[name] is a 66 y.o. male with past medical history reviewed below including cardiomyopathy, A-fib on Coumadin presents with dysuria over the past several days along with increasing weakness.  Last night, patient had shaking chills and was very diaphoretic.  Patient's wife and daughter at bedside state they went to the PCP for evaluation and had a urine test but no antibiotics were started at that visit.  With worsening symptoms overnight they present today for evaluation.  Family note that patient did seem very weak and lightheaded this morning.  Patient denies any pain in his chest, abdomen, flank or back.  He does have pain with urination but denies noticing any gross hematuria." ? ?He received 1 g of ceftriaxone IVPB and 500 mL of NS bolus. ? ?Plan of care: ?The patient is accepted for admission to Telemetry unit, at [facility]..  ? ?Author: ?Bobette Mo, MD ?01/31/2022 ? ?Check [email or web address] for on-call coverage. ? ?Nursing staff, Please call [facility] Admits & Consults System-Wide number on Amion as soon as patient's arrival, so appropriate admitting provider can evaluate the pt. ?

## 2022-01-31 NOTE — ED Notes (Signed)
CRITICAL VALUE STICKER ? ?CRITICAL VALUE: Lactic 2.3 ? ?RECEIVER (on-site recipient of call):Nitza Schmid, RN ? ?DATE & TIME NOTIFIED: 1254 ? ?MESSENGER (representative from lab):Erin, RN ? ?MD NOTIFIED: MD Long ? ?TIME OF NOTIFICATION:1255  ? ?RESPONSE:  See Orders ?

## 2022-01-31 NOTE — Plan of Care (Signed)

## 2022-02-01 DIAGNOSIS — A419 Sepsis, unspecified organism: Secondary | ICD-10-CM | POA: Diagnosis not present

## 2022-02-01 DIAGNOSIS — I442 Atrioventricular block, complete: Secondary | ICD-10-CM | POA: Diagnosis not present

## 2022-02-01 DIAGNOSIS — D649 Anemia, unspecified: Secondary | ICD-10-CM

## 2022-02-01 DIAGNOSIS — I4819 Other persistent atrial fibrillation: Secondary | ICD-10-CM | POA: Diagnosis not present

## 2022-02-01 DIAGNOSIS — E871 Hypo-osmolality and hyponatremia: Secondary | ICD-10-CM | POA: Diagnosis not present

## 2022-02-01 LAB — COMPREHENSIVE METABOLIC PANEL
ALT: 16 U/L (ref 0–44)
AST: 16 U/L (ref 15–41)
Albumin: 3.4 g/dL — ABNORMAL LOW (ref 3.5–5.0)
Alkaline Phosphatase: 74 U/L (ref 38–126)
Anion gap: 7 (ref 5–15)
BUN: 13 mg/dL (ref 8–23)
CO2: 26 mmol/L (ref 22–32)
Calcium: 8.7 mg/dL — ABNORMAL LOW (ref 8.9–10.3)
Chloride: 102 mmol/L (ref 98–111)
Creatinine, Ser: 0.82 mg/dL (ref 0.61–1.24)
GFR, Estimated: >60 mL/min (ref >60)
Glucose, Bld: 105 mg/dL — ABNORMAL HIGH (ref 70–99)
Potassium: 4.5 mmol/L (ref 3.5–5.1)
Sodium: 135 mmol/L (ref 135–145)
Total Bilirubin: 0.5 mg/dL (ref 0.3–1.2)
Total Protein: 6.9 g/dL (ref 6.5–8.1)

## 2022-02-01 LAB — CBC
HCT: 38.1 % — ABNORMAL LOW (ref 39.0–52.0)
Hemoglobin: 12.5 g/dL — ABNORMAL LOW (ref 13.0–17.0)
MCH: 30.4 pg (ref 26.0–34.0)
MCHC: 32.8 g/dL (ref 30.0–36.0)
MCV: 92.7 fL (ref 80.0–100.0)
Platelets: 326 10*3/uL (ref 150–400)
RBC: 4.11 MIL/uL — ABNORMAL LOW (ref 4.22–5.81)
RDW: 13.9 % (ref 11.5–15.5)
WBC: 16.2 10*3/uL — ABNORMAL HIGH (ref 4.0–10.5)
nRBC: 0 % (ref 0.0–0.2)

## 2022-02-01 LAB — PROTIME-INR
INR: 2.3 — ABNORMAL HIGH (ref 0.8–1.2)
Prothrombin Time: 25 seconds — ABNORMAL HIGH (ref 11.4–15.2)

## 2022-02-01 LAB — MAGNESIUM: Magnesium: 2.1 mg/dL (ref 1.7–2.4)

## 2022-02-01 MED ORDER — WARFARIN SODIUM 5 MG PO TABS
5.0000 mg | ORAL_TABLET | Freq: Once | ORAL | Status: AC
  Administered 2022-02-01: 5 mg via ORAL
  Filled 2022-02-01: qty 1

## 2022-02-01 NOTE — Progress Notes (Signed)
ANTICOAGULATION CONSULT NOTE - Follow Up Consult ? ?Pharmacy Consult for warfarin ?Indication: atrial fibrillation ? ?No Known Allergies ? ? ?Patient Measurements: ?Height: 5\' 10"  (177.8 cm) ?Weight: 79.6 kg (175 lb 8 oz) ?IBW/kg (Calculated) : 73 ?Heparin Dosing Weight: 84.3 kg ? ?Vital Signs: ?Temp: 98.5 ?F (36.9 ?C) (03/27 0542) ?Temp Source: Oral (03/27 0542) ?BP: 110/64 (03/27 0542) ?Pulse Rate: 79 (03/27 0542) ? ?Labs: ?Recent Labs  ?  01/31/22 ?1150 01/31/22 ?1617 02/01/22 ?0322  ?HGB 13.3  --  12.5*  ?HCT 40.7  --  38.1*  ?PLT 376  --  326  ?LABPROT  --  24.5* 25.0*  ?INR  --  2.2* 2.3*  ?CREATININE 0.99  --  0.82  ? ? ? ?Estimated Creatinine Clearance: 92.7 mL/min (by C-G formula based on SCr of 0.82 mg/dL). ? ? ?Medications:  ?Scheduled:  ? aspirin EC  81 mg Oral Daily  ? lisinopril  10 mg Oral q AM  ? Warfarin - Pharmacist Dosing Inpatient   Does not apply [id number]  ? ? ?Assessment: ?66 yo male presenting with dysuria and weakness.  PMH includes cardiomyopathy, mitral valve repair w/ mechanical prosthetic valve, tricuspid valve repair, and atrial fibrillation on warfarin PTA.   ? ?Discussed with patient - his dose is warfarin 5mg  daily. Patient states his last dose of warfarin was 3/26 ? ?Today, 02/01/22 ?INR slightly subtherapeutic this morning at 2.3 ?CBC stable ?Thin liquid diet ordered - no meals charted yet ? ? ?Goal of Therapy:  ?INR 2.5-3.5 per outpatient anticoag clinic notes (also confirmed w/ patient) ?Monitor platelets by anticoagulation protocol: Yes ?  ?Plan:  ?5mg  warfarin today at 1600 ?Monitor daily INR, CBC, s/sx bleeding ? ? ?Adrian Saran, PharmD, BCPS ?Secure Chat if ?s ?02/01/2022 8:08 AM ? ? ?

## 2022-02-01 NOTE — Plan of Care (Signed)
  Problem: Nutrition: Goal: Adequate nutrition will be maintained Outcome: Progressing   Problem: Coping: Goal: Level of anxiety will decrease Outcome: Progressing   Problem: Elimination: Goal: Will not experience complications related to bowel motility Outcome: Progressing Goal: Will not experience complications related to urinary retention Outcome: Progressing   Problem: Pain Managment: Goal: General experience of comfort will improve Outcome: Progressing   Problem: Safety: Goal: Ability to remain free from injury will improve Outcome: Progressing   Problem: Skin Integrity: Goal: Risk for impaired skin integrity will decrease Outcome: Progressing   Problem: Urinary Elimination: Goal: Signs and symptoms of infection will decrease Outcome: Progressing   

## 2022-02-01 NOTE — Progress Notes (Signed)
?  Transition of Care (TOC) Screening Note ? ? ?Patient Details  ?Name: [name] [name] ?Date of Birth: 1956/01/25 ? ? ?Transition of Care (TOC) CM/SW Contact:    ?Kasin Tonkinson Aris Lot, LCSW ?Phone Number: ?02/01/2022, 2:54 PM ? ? ? ?Transition of Care Department [facility]) has reviewed patient and no TOC needs have been identified at this time. We will continue to monitor patient advancement through interdisciplinary progression rounds. If new patient transition needs arise, please place a TOC consult. ?  ?

## 2022-02-01 NOTE — Progress Notes (Signed)
?PROGRESS NOTE ? ? ? ?[name] [name]  UJW:[id number] DOB: 07-18-1956 DOA: 01/31/2022 ?PCP: Clovis Riley, L.August Saucer, MD  ? ?Brief Narrative:  ?66 y.o. 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  He was admitted for sepsis secondary to UTI and started on IV antibiotics. ? ?Assessment & Plan: ?  ?Sepsis: Present on admission ?UTI ?Leukocytosis ?Lactic acidosis ?-Presented with chills, dysuria, increasing weakness and was found to have leukocytosis, lactic acidosis, evidence of UTI ?-Continue Rocephin.  Follow cultures.  Lactic acid has normalized. ?-WBC 16.2 today.  Monitor. ?  ?Mild hyponatremia ?-Improved.  Encourage oral intake. ?  ?Persistent A-fib on chronic anticoagulation with Coumadin ?-Monitor INR.  Coumadin to be dosed as per pharmacy. ?  ?Hypertension ?-blood pressure on the lower side.  Resume lisinopril if blood pressure becomes elevated ?  ?History of complete heart block status post pacemaker ?Severe MR/TR status post MVR/TV repair ?Chronic nonischemic heart failure with subsequently improved EF and most recent echo ?-Currently looks compensated.  Outpatient follow-up with cardiology ?  ?Physical deconditioning ?-Hopefully symptoms start improving with treatment of UTI.  If symptoms persist, will request PT eval ? ?Normocytic anemia ?-Hemoglobin stable.  Outpatient follow-up. ?  ?  ?DVT prophylaxis: Coumadin ?Code Status: Full ?Family Communication: Wife and daughter at bedside on 02/01/2022 ?Disposition Plan: ?Status is: Inpatient ?Remains inpatient appropriate because: Of need for IV antibiotics.  Cultures pending ? ? ? ?Consultants: None ? ?Procedures: None ? ?Antimicrobials: Rocephin  from 01/31/2022 onwards ? ? ?Subjective: ?Patient seen and examined at bedside.  Still feels weak but improving.  Denies any overnight fever, chills, vomiting ? ?Objective: ?Vitals:  ? 01/31/22 2021 02/01/22 0011 02/01/22 0500 02/01/22 0542  ?BP: 133/61 (!) 114/59  110/64  ?Pulse: 83 80  79  ?Resp: (!) 22 20  18   ?Temp: 98.3 ?F (36.8 ?C) 98.6 ?F (37 ?C)  98.5 ?F (36.9 ?C)  ?TempSrc: Oral Oral  Oral  ?SpO2: 96% 96%  96%  ?Weight:   79.6 kg   ?Height:      ? ? ?Intake/Output Summary (Last 24 hours) at 02/01/2022 0748 ?Last data filed at 01/31/2022 1500 ?Gross per 24 hour  ?Intake 680.34 ml  ?Output --  ?Net 680.34 ml  ? ?Filed Weights  ? 01/31/22 1149 02/01/22 0500  ?Weight: 84.3 kg 79.6 kg  ? ? ?Examination: ? ?General exam: Appears calm and comfortable.  Currently on room air. ?Respiratory system: Bilateral decreased breath sounds at bases, no wheezing ?Cardiovascular system: S1 & S2 heard, Rate controlled ?Gastrointestinal system: Abdomen is nondistended, soft and nontender. Normal bowel sounds heard. ?Extremities: No cyanosis, clubbing; trace lower extremity edema ?Central nervous system: Alert and oriented. No focal neurological deficits. Moving extremities ?Skin: No rashes, lesions or ulcers ?Psychiatry: Judgement and insight appear normal. Mood & affect appropriate.  ? ? ? ?Data Reviewed: I have personally reviewed following labs and imaging studies ? ?CBC: ?Recent Labs  ?Lab 01/31/22 ?1150 02/01/22 ?0322  ?WBC 23.0* 16.2*  ?NEUTROABS 19.3*  --   ?HGB 13.3 12.5*  ?HCT 40.7 38.1*  ?MCV 91.3 92.7  ?PLT 376 326  ? ?Basic Metabolic Panel: ?Recent Labs  ?Lab 01/31/22 ?1150 02/01/22 ?0322  ?NA 132* 135  ?K 3.9 4.5  ?CL 100 102  ?CO2  24 26  ?GLUCOSE 133* 105*  ?BUN 13 13  ?CREATININE 0.99 0.82  ?CALCIUM 9.6 8.7*  ?MG  --  2.1  ? ?GFR: ?Estimated Creatinine Clearance: 92.7 mL/min (by C-G formula based on SCr of 0.82 mg/dL). ?Liver Function Tests: ?Recent Labs  ?Lab 01/31/22 ?1150 02/01/22 ?0322  ?AST 16 16  ?ALT 15 16   ?ALKPHOS 73 74  ?BILITOT 0.8 0.5  ?PROT 7.5 6.9  ?ALBUMIN 4.1 3.4*  ? ?No results for input(s): LIPASE, AMYLASE in the last 168 hours. ?No results for input(s): AMMONIA in the last 168 hours. ?Coagulation Profile: ?Recent Labs  ?Lab 01/31/22 ?1617 02/01/22 ?0322  ?INR 2.2* 2.3*  ? ?Cardiac Enzymes: ?No results for input(s): CKTOTAL, CKMB, CKMBINDEX, TROPONINI in the last 168 hours. ?BNP (last 3 results) ?No results for input(s): PROBNP in the last 8760 hours. ?HbA1C: ?No results for input(s): HGBA1C in the last 72 hours. ?CBG: ?No results for input(s): GLUCAP in the last 168 hours. ?Lipid Profile: ?No results for input(s): CHOL, HDL, LDLCALC, TRIG, CHOLHDL, LDLDIRECT in the last 72 hours. ?Thyroid Function Tests: ?No results for input(s): TSH, T4TOTAL, FREET4, T3FREE, THYROIDAB in the last 72 hours. ?Anemia Panel: ?No results for input(s): VITAMINB12, FOLATE, FERRITIN, TIBC, IRON, RETICCTPCT in the last 72 hours. ?Sepsis Labs: ?Recent Labs  ?Lab 01/31/22 ?1150 01/31/22 ?1350  ?LATICACIDVEN 2.3* 1.0  ? ? ?No results found for this or any previous visit (from the past 240 hour(s)).  ? ? ? ? ? ?Radiology Studies: ?No results found. ? ? ? ? ? ?Scheduled Meds: ? aspirin EC  81 mg Oral Daily  ? lisinopril  10 mg Oral q AM  ? Warfarin - Pharmacist Dosing Inpatient   Does not apply q1600  ? ?Continuous Infusions: ? cefTRIAXone (ROCEPHIN)  IV    ? ? ? ? ? ? ? ? ?Glade Lloyd, MD ?[facility] ?02/01/2022, 7:48 AM  ? ?

## 2022-02-02 DIAGNOSIS — I442 Atrioventricular block, complete: Secondary | ICD-10-CM | POA: Diagnosis not present

## 2022-02-02 DIAGNOSIS — E871 Hypo-osmolality and hyponatremia: Secondary | ICD-10-CM | POA: Diagnosis not present

## 2022-02-02 DIAGNOSIS — D649 Anemia, unspecified: Secondary | ICD-10-CM

## 2022-02-02 DIAGNOSIS — I4819 Other persistent atrial fibrillation: Secondary | ICD-10-CM | POA: Diagnosis not present

## 2022-02-02 DIAGNOSIS — A419 Sepsis, unspecified organism: Secondary | ICD-10-CM | POA: Diagnosis not present

## 2022-02-02 LAB — CBC WITH DIFFERENTIAL/PLATELET
Abs Immature Granulocytes: 0.06 10*3/uL (ref 0.00–0.07)
Basophils Absolute: 0.1 10*3/uL (ref 0.0–0.1)
Basophils Relative: 1 %
Eosinophils Absolute: 0.3 10*3/uL (ref 0.0–0.5)
Eosinophils Relative: 3 %
HCT: 37.7 % — ABNORMAL LOW (ref 39.0–52.0)
Hemoglobin: 12.3 g/dL — ABNORMAL LOW (ref 13.0–17.0)
Immature Granulocytes: 1 %
Lymphocytes Relative: 15 %
Lymphs Abs: 1.5 10*3/uL (ref 0.7–4.0)
MCH: 30.1 pg (ref 26.0–34.0)
MCHC: 32.6 g/dL (ref 30.0–36.0)
MCV: 92.4 fL (ref 80.0–100.0)
Monocytes Absolute: 1 10*3/uL (ref 0.1–1.0)
Monocytes Relative: 10 %
Neutro Abs: 6.9 10*3/uL (ref 1.7–7.7)
Neutrophils Relative %: 70 %
Platelets: 299 10*3/uL (ref 150–400)
RBC: 4.08 MIL/uL — ABNORMAL LOW (ref 4.22–5.81)
RDW: 13.8 % (ref 11.5–15.5)
WBC: 9.8 10*3/uL (ref 4.0–10.5)
nRBC: 0 % (ref 0.0–0.2)

## 2022-02-02 LAB — URINE CULTURE: Culture: 80000 — AB

## 2022-02-02 LAB — BASIC METABOLIC PANEL
Anion gap: 7 (ref 5–15)
BUN: 13 mg/dL (ref 8–23)
CO2: 24 mmol/L (ref 22–32)
Calcium: 8.4 mg/dL — ABNORMAL LOW (ref 8.9–10.3)
Chloride: 105 mmol/L (ref 98–111)
Creatinine, Ser: 0.87 mg/dL (ref 0.61–1.24)
GFR, Estimated: >60 mL/min (ref >60)
Glucose, Bld: 105 mg/dL — ABNORMAL HIGH (ref 70–99)
Potassium: 4.4 mmol/L (ref 3.5–5.1)
Sodium: 136 mmol/L (ref 135–145)

## 2022-02-02 LAB — MAGNESIUM: Magnesium: 2.3 mg/dL (ref 1.7–2.4)

## 2022-02-02 MED ORDER — CEPHALEXIN 500 MG PO CAPS: 500.0000 mg | ORAL_CAPSULE | Freq: Three times a day (TID) | ORAL | 0 refills | Status: AC

## 2022-02-02 NOTE — Discharge Summary (Signed)
Physician Discharge Summary  ?[name] [name] [id number] DOB: 07-16-1956 DOA: 01/31/2022 ? ?PCP: Alroy Dust, RobertMarlou Sa, MD ? ?Admit date: 01/31/2022 ?Discharge date: 02/02/2022 ? ?Admitted From: Home ?Disposition: Home ? ?Recommendations for Outpatient Follow-up:  ?Follow up with PCP in 1 week with repeat CBC/BMP ?Follow up in ED if symptoms worsen or new appear ? ? ?Home Health: No ?Equipment/Devices: None ? ?Discharge Condition: Stable ?CODE STATUS: Full ?Diet recommendation: Heart healthy ? ?Brief/Interim Summary: ?66 y.o. 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  He was admitted for sepsis secondary to UTI and started on IV antibiotics.  During the hospitalization, his condition is improved.  Urine culture grew E. coli.  He is currently on IV Rocephin.  Currently afebrile with no leukocytosis.  He will be discharged home on oral Keflex today.  Outpatient follow-up with PCP. ? ?Discharge Diagnoses:  ? ?Sepsis: Present on admission ?UTI ?Leukocytosis ?Lactic acidosis ?-Presented with chills, dysuria, increasing weakness and was found to have leukocytosis, lactic acidosis, evidence of UTI ?-Currently on Rocephin.  Lactic acid has normalized. ?-WBCs have normalized today ?-Blood cultures negative so far.  Urine culture grew pansensitive E. coli.  ?-Sepsis has resolved ?-Feels much better and tolerating diet.  He will be discharged home today on oral Keflex for 5 more days. ? ? Mild hyponatremia ?-Improved.  Outpatient follow-up. ?  ?Persistent A-fib on chronic anticoagulation with Coumadin ?-Continue Coumadin.  Outpatient follow-up with PCP for Coumadin dosing based on INR as an outpatient ?   ?Hypertension ?-Continue lisinopril.  Outpatient follow-up. ? ?History of complete heart block status post pacemaker ?Severe MR/TR status post MVR/TV repair ?Chronic nonischemic heart failure with subsequently improved EF and most recent echo ?-Currently looks compensated.  Outpatient follow-up with cardiology ?  ?Normocytic anemia ?-Hemoglobin stable.  Outpatient follow-up. ?  ? ?Discharge Instructions ? ?Discharge Instructions   ? ? Diet - low sodium heart healthy   Complete by: As directed ?  ? Increase activity slowly   Complete by: As directed ?  ? ?  ? ?Allergies as of 02/02/2022   ?No Known Allergies ?  ? ?  ?Medication List  ?  ? ?TAKE these medications   ? ?acetaminophen 325 MG tablet ?Commonly known as: TYLENOL ?Take 325-650 mg by mouth every 6 (six) hours as needed for mild pain, headache or fever. ?  ?aspirin 81 MG EC tablet ?Take 1 tablet (81 mg total) by mouth daily. ?  ?Azelastine HCl 0.15 % Soln ?Place 1-2 sprays into both nostrils daily as needed (for rhinitis). ?  ?cephALEXin 500 MG capsule ?Commonly known as: KEFLEX ?Take 1 capsule (500 mg total) by mouth 3 (three) times daily for 5 days. ?  ?lisinopril 10 MG tablet ?Commonly known as: ZESTRIL ?TAKE 1 TABLET BY MOUTH DAILY ?What changed: when to take this ?  ?warfarin 5 MG tablet ?Commonly known as: COUMADIN ?Take 1 to 1 and 1/2 tablets daily as directed by coumadin clinic ?What changed:  ?how much to take ?how to take this ?when to take this ?additional instructions ?  ? ?  ? ? Follow-up Information   ? ? Mitchell, RobertMarlou Sa, MD. Schedule an appointment as soon as possible for a visit in 1 week(s).   ?Specialty: Family Medicine ?Why:  with repeat cbc/bmp/inr ?Contact information: ?[address] ?[address] ?[place] [address] ?[phone] ? ? ?  ?  ? ?  ?  ? ?  ? ?No Known Allergies ? ?Consultations: ?None ? ? ?Procedures/Studies: ?No results found. ? ? ? ?Subjective: ?Patient seen and examined at bedside.  He feels much better and wants to go  home today.  Denies worsening fever, nausea, vomiting.  Weakness has improved. ? ?Discharge Exam: ?Vitals:  ? 02/01/22 2058 02/02/22 0455  ?BP: 117/68 115/60  ?Pulse: 77 85  ?Resp: 20 20  ?Temp: 98.1 ?F (36.7 ?C) 98 ?F (36.7 ?C)  ?SpO2: 94% 96%  ? ? ?General: Pt is alert, awake, not in acute distress.  Currently on room air. ?Cardiovascular: rate controlled, S1/S2 + ?Respiratory: bilateral decreased breath sounds at bases ?Abdominal: Soft, NT, ND, bowel sounds + ?Extremities: Mild lower extremity edema; no cyanosis ? ? ? ?The results of significant diagnostics from this hospitalization (including imaging, microbiology, ancillary and laboratory) are listed below for reference.   ? ? ?Microbiology: ?Recent Results (from the past 240 hour(s))  ?Urine Culture     Status: Abnormal  ? Collection Time: 01/31/22 12:05 PM  ? Specimen: Urine, Clean Catch  ?Result Value Ref Range Status  ? Specimen Description   Final  ?  URINE, CLEAN CATCH ?Performed at [facility], [address] [place], [place] [address] ?  ? Special Requests   Final  ?  NONE ?Performed at [facility], [address], [place], [place] [address] ?  ? Culture 80,000 COLONIES/mL ESCHERICHIA COLI (A)  Final  ? Report Status 02/02/2022 FINAL  Final  ? Organism ID, Bacteria ESCHERICHIA COLI (A)  Final  ?    Susceptibility  ? Escherichia coli - MIC*  ?  AMPICILLIN <=2 SENSITIVE Sensitive   ?  CEFAZOLIN <=4 SENSITIVE Sensitive   ?  CEFEPIME <=0.12 SENSITIVE Sensitive   ?  CEFTRIAXONE <=0.25 SENSITIVE Sensitive   ?  CIPROFLOXACIN <=0.25 SENSITIVE Sensitive   ?  GENTAMICIN <=1 SENSITIVE Sensitive   ?  IMIPENEM <=0.25 SENSITIVE Sensitive   ?  NITROFURANTOIN <=16 SENSITIVE Sensitive   ?  TRIMETH/SULFA <=20 SENSITIVE Sensitive   ?  AMPICILLIN/SULBACTAM <=2 SENSITIVE Sensitive   ?  PIP/TAZO <=4 SENSITIVE Sensitive   ?  * 80,000 COLONIES/mL ESCHERICHIA COLI  ?Culture, blood (routine x 2)     Status: None (Preliminary result)  ?  Collection Time: 01/31/22 12:30 PM  ? Specimen: BLOOD  ?Result Value Ref Range Status  ? Specimen Description   Final  ?  BLOOD LEFT ANTECUBITAL ?Performed at [facility], [address] [place], [place] [address] ?  ? Special Requests   Final  ?  BOTTLES DRAWN AEROBIC AND ANAEROBIC Blood Culture adequate volume ?Performed at [facility], [address] [place], [place] [address] ?  ? Culture   Final  ?  NO GROWTH 2 DAYS ?Performed at [facility], [place] [address] [place], [place] [address] ?  ? Report Status PENDING  Incomplete  ?Culture, blood (routine x 2)     Status: None (Preliminary result)  ? Collection Time: 01/31/22 12:30 PM  ? Specimen: BLOOD  ?Result Value Ref Range Status  ? Specimen Description   Final  ?  BLOOD RIGHT ANTECUBITAL ?Performed at [facility], [address] [place],  [address] ?  ? Special Requests   Final  ?  BOTTLES DRAWN AEROBIC AND ANAEROBIC Blood Culture adequate  volume ?Performed at [facility], [address] [place], [place] [address] ?  ? Culture   Final  ?  NO GROWTH 2 DAYS ?Performed at [facility], [place] [address], [place], [place] [address] ?  ? Report Status PENDING  Incomplete  ?  ? ?Labs: ?BNP (last 3 results) ?No results for input(s): BNP in the last 8760 hours. ?Basic Metabolic Panel: ?Recent Labs  ?Lab 01/31/22 ?1150 02/01/22 ?0322 02/02/22 ?0316  ?NA 132* 135 136  ?K 3.9 4.5 4.4  ?CL 100 102 105  ?CO2 24 26 24   ?GLUCOSE 133* 105* 105*  ?BUN 13 13 13   ?CREATININE 0.99 0.82 0.87  ?CALCIUM 9.6 8.7* 8.4*  ?MG  --  2.1 2.3  ? ?Liver Function Tests: ?Recent Labs  ?Lab 01/31/22 ?1150 02/01/22 ?0322  ?AST 16 16  ?ALT 15 16  ?ALKPHOS 73 74  ?BILITOT 0.8 0.5  ?PROT 7.5 6.9  ?ALBUMIN 4.1 3.4*  ? ?No results for input(s): LIPASE, AMYLASE in the last 168 hours. ?No results for input(s): AMMONIA in the last 168 hours. ?CBC: ?Recent Labs  ?Lab 01/31/22 ?1150 02/01/22 ?0322  02/02/22 ?0316  ?WBC 23.0* 16.2* 9.8  ?NEUTROABS 19.3*  --  6.9  ?HGB 13.3 12.5* 12.3*  ?HCT 40.7 38.1* 37.7*  ?MCV 91.3 92.7 92.4  ?PLT 376 326 299  ? ?Cardiac Enzymes: ?No results for input(s): CKTOTAL, CKMB,

## 2022-02-02 NOTE — Plan of Care (Signed)
?  Problem: Nutrition: ?Goal: Adequate nutrition will be maintained ?Outcome: Adequate for Discharge ?  ?Problem: Elimination: ?Goal: Will not experience complications related to bowel motility ?Outcome: Adequate for Discharge ?  ?Problem: Urinary Elimination: ?Goal: Signs and symptoms of infection will decrease ?Outcome: Adequate for Discharge ?  ?

## 2022-02-05 LAB — CULTURE, BLOOD (ROUTINE X 2)
Culture: NO GROWTH
Culture: NO GROWTH
Special Requests: ADEQUATE
Special Requests: ADEQUATE

## 2022-02-11 DIAGNOSIS — Z09 Encounter for follow-up examination after completed treatment for conditions other than malignant neoplasm: Secondary | ICD-10-CM | POA: Diagnosis not present

## 2022-02-11 DIAGNOSIS — I4891 Unspecified atrial fibrillation: Secondary | ICD-10-CM | POA: Diagnosis not present

## 2022-02-11 DIAGNOSIS — D649 Anemia, unspecified: Secondary | ICD-10-CM | POA: Diagnosis not present

## 2022-02-11 DIAGNOSIS — A419 Sepsis, unspecified organism: Secondary | ICD-10-CM | POA: Diagnosis not present

## 2022-02-11 DIAGNOSIS — Z7901 Long term (current) use of anticoagulants: Secondary | ICD-10-CM | POA: Diagnosis not present

## 2022-02-18 DIAGNOSIS — Z7901 Long term (current) use of anticoagulants: Secondary | ICD-10-CM | POA: Diagnosis not present

## 2022-02-22 ENCOUNTER — Ambulatory Visit (INDEPENDENT_AMBULATORY_CARE_PROVIDER_SITE_OTHER): Payer: Medicare HMO

## 2022-02-22 DIAGNOSIS — I442 Atrioventricular block, complete: Secondary | ICD-10-CM

## 2022-02-23 LAB — CUP PACEART REMOTE DEVICE CHECK
Battery Remaining Longevity: 23 mo
Battery Voltage: 2.88 V
Brady Statistic AP VP Percent: 68.13 %
Brady Statistic AP VS Percent: 0.49 %
Brady Statistic AS VP Percent: 28.77 %
Brady Statistic AS VS Percent: 2.55 %
Brady Statistic RA Percent Paced: 38.4 %
Brady Statistic RV Percent Paced: 93.2 %
Date Time Interrogation Session: 20230417021035
Implantable Lead Implant Date: 20180111
Implantable Lead Implant Date: 20180111
Implantable Lead Implant Date: 20180111
Implantable Lead Location: 753858
Implantable Lead Location: 753859
Implantable Lead Location: 753860
Implantable Lead Model: 4398
Implantable Lead Model: 5076
Implantable Lead Model: 5076
Implantable Pulse Generator Implant Date: 20180111
Lead Channel Impedance Value: 323 Ohm
Lead Channel Impedance Value: 323 Ohm
Lead Channel Impedance Value: 456 Ohm
Lead Channel Impedance Value: 456 Ohm
Lead Channel Impedance Value: 494 Ohm
Lead Channel Impedance Value: 532 Ohm
Lead Channel Impedance Value: 608 Ohm
Lead Channel Impedance Value: 684 Ohm
Lead Channel Impedance Value: 893 Ohm
Lead Channel Pacing Threshold Amplitude: 0.75 V
Lead Channel Pacing Threshold Amplitude: 1 V
Lead Channel Pacing Threshold Amplitude: 1 V
Lead Channel Pacing Threshold Pulse Width: 0.4 ms
Lead Channel Pacing Threshold Pulse Width: 0.4 ms
Lead Channel Pacing Threshold Pulse Width: 1 ms
Lead Channel Sensing Intrinsic Amplitude: 0.375 mV
Lead Channel Sensing Intrinsic Amplitude: 0.375 mV
Lead Channel Sensing Intrinsic Amplitude: 3.5 mV
Lead Channel Sensing Intrinsic Amplitude: 3.5 mV
Lead Channel Setting Pacing Amplitude: 1.5 V
Lead Channel Setting Pacing Amplitude: 2 V
Lead Channel Setting Pacing Amplitude: 3.25 V
Lead Channel Setting Pacing Pulse Width: 0.4 ms
Lead Channel Setting Pacing Pulse Width: 1 ms
Lead Channel Setting Sensing Sensitivity: 2 mV

## 2022-03-10 NOTE — Progress Notes (Signed)
Remote pacemaker transmission.   

## 2022-03-18 DIAGNOSIS — Z7901 Long term (current) use of anticoagulants: Secondary | ICD-10-CM | POA: Diagnosis not present

## 2022-04-15 DIAGNOSIS — Z7901 Long term (current) use of anticoagulants: Secondary | ICD-10-CM | POA: Diagnosis not present

## 2022-05-03 DIAGNOSIS — Z7901 Long term (current) use of anticoagulants: Secondary | ICD-10-CM | POA: Diagnosis not present

## 2022-05-24 ENCOUNTER — Ambulatory Visit (INDEPENDENT_AMBULATORY_CARE_PROVIDER_SITE_OTHER): Payer: Medicare HMO

## 2022-05-24 DIAGNOSIS — I442 Atrioventricular block, complete: Secondary | ICD-10-CM | POA: Diagnosis not present

## 2022-05-25 LAB — CUP PACEART REMOTE DEVICE CHECK
Battery Remaining Longevity: 23 mo
Battery Voltage: 2.88 V
Brady Statistic RA Percent Paced: 4.02 %
Brady Statistic RV Percent Paced: 86.89 %
Date Time Interrogation Session: 20230717125640
Implantable Lead Implant Date: 20180111
Implantable Lead Implant Date: 20180111
Implantable Lead Implant Date: 20180111
Implantable Lead Location: 753858
Implantable Lead Location: 753859
Implantable Lead Location: 753860
Implantable Lead Model: 4398
Implantable Lead Model: 5076
Implantable Lead Model: 5076
Implantable Pulse Generator Implant Date: 20180111
Lead Channel Impedance Value: 342 Ohm
Lead Channel Impedance Value: 342 Ohm
Lead Channel Impedance Value: 475 Ohm
Lead Channel Impedance Value: 475 Ohm
Lead Channel Impedance Value: 494 Ohm
Lead Channel Impedance Value: 551 Ohm
Lead Channel Impedance Value: 665 Ohm
Lead Channel Impedance Value: 722 Ohm
Lead Channel Impedance Value: 893 Ohm
Lead Channel Pacing Threshold Amplitude: 0.75 V
Lead Channel Pacing Threshold Amplitude: 1 V
Lead Channel Pacing Threshold Amplitude: 1.125 V
Lead Channel Pacing Threshold Pulse Width: 0.4 ms
Lead Channel Pacing Threshold Pulse Width: 0.4 ms
Lead Channel Pacing Threshold Pulse Width: 1 ms
Lead Channel Sensing Intrinsic Amplitude: 0.75 mV
Lead Channel Sensing Intrinsic Amplitude: 0.75 mV
Lead Channel Sensing Intrinsic Amplitude: 4.625 mV
Lead Channel Sensing Intrinsic Amplitude: 4.625 mV
Lead Channel Setting Pacing Amplitude: 1.75 V
Lead Channel Setting Pacing Amplitude: 2 V
Lead Channel Setting Pacing Amplitude: 2.5 V
Lead Channel Setting Pacing Pulse Width: 0.4 ms
Lead Channel Setting Pacing Pulse Width: 1 ms
Lead Channel Setting Sensing Sensitivity: 2 mV

## 2022-05-31 DIAGNOSIS — Z7901 Long term (current) use of anticoagulants: Secondary | ICD-10-CM | POA: Diagnosis not present

## 2022-06-18 DIAGNOSIS — H5203 Hypermetropia, bilateral: Secondary | ICD-10-CM | POA: Diagnosis not present

## 2022-06-19 DIAGNOSIS — H524 Presbyopia: Secondary | ICD-10-CM | POA: Diagnosis not present

## 2022-06-25 NOTE — Progress Notes (Signed)
Remote pacemaker transmission.   

## 2022-06-28 DIAGNOSIS — Z7901 Long term (current) use of anticoagulants: Secondary | ICD-10-CM | POA: Diagnosis not present

## 2022-07-26 DIAGNOSIS — Z7901 Long term (current) use of anticoagulants: Secondary | ICD-10-CM | POA: Diagnosis not present

## 2022-08-17 DIAGNOSIS — H40033 Anatomical narrow angle, bilateral: Secondary | ICD-10-CM | POA: Diagnosis not present

## 2022-08-17 DIAGNOSIS — H18413 Arcus senilis, bilateral: Secondary | ICD-10-CM | POA: Diagnosis not present

## 2022-08-17 DIAGNOSIS — H2513 Age-related nuclear cataract, bilateral: Secondary | ICD-10-CM | POA: Diagnosis not present

## 2022-08-17 DIAGNOSIS — H40032 Anatomical narrow angle, left eye: Secondary | ICD-10-CM | POA: Diagnosis not present

## 2022-08-17 DIAGNOSIS — H25043 Posterior subcapsular polar age-related cataract, bilateral: Secondary | ICD-10-CM | POA: Diagnosis not present

## 2022-08-23 ENCOUNTER — Ambulatory Visit (INDEPENDENT_AMBULATORY_CARE_PROVIDER_SITE_OTHER): Payer: Medicare HMO

## 2022-08-23 DIAGNOSIS — I442 Atrioventricular block, complete: Secondary | ICD-10-CM

## 2022-08-23 DIAGNOSIS — Z7901 Long term (current) use of anticoagulants: Secondary | ICD-10-CM | POA: Diagnosis not present

## 2022-08-26 LAB — CUP PACEART REMOTE DEVICE CHECK
Battery Remaining Longevity: 19 mo
Battery Voltage: 2.85 V
Brady Statistic RA Percent Paced: 3.14 %
Brady Statistic RV Percent Paced: 89.47 %
Date Time Interrogation Session: 20231018165535
Implantable Lead Implant Date: 20180111
Implantable Lead Implant Date: 20180111
Implantable Lead Implant Date: 20180111
Implantable Lead Location: 753858
Implantable Lead Location: 753859
Implantable Lead Location: 753860
Implantable Lead Model: 4398
Implantable Lead Model: 5076
Implantable Lead Model: 5076
Implantable Pulse Generator Implant Date: 20180111
Lead Channel Impedance Value: 342 Ohm
Lead Channel Impedance Value: 342 Ohm
Lead Channel Impedance Value: 456 Ohm
Lead Channel Impedance Value: 494 Ohm
Lead Channel Impedance Value: 551 Ohm
Lead Channel Impedance Value: 570 Ohm
Lead Channel Impedance Value: 684 Ohm
Lead Channel Impedance Value: 722 Ohm
Lead Channel Impedance Value: 988 Ohm
Lead Channel Pacing Threshold Amplitude: 0.75 V
Lead Channel Pacing Threshold Amplitude: 0.75 V
Lead Channel Pacing Threshold Amplitude: 0.75 V
Lead Channel Pacing Threshold Pulse Width: 0.4 ms
Lead Channel Pacing Threshold Pulse Width: 0.4 ms
Lead Channel Pacing Threshold Pulse Width: 1 ms
Lead Channel Sensing Intrinsic Amplitude: 0.375 mV
Lead Channel Sensing Intrinsic Amplitude: 0.375 mV
Lead Channel Sensing Intrinsic Amplitude: 3.875 mV
Lead Channel Sensing Intrinsic Amplitude: 3.875 mV
Lead Channel Setting Pacing Amplitude: 1.5 V
Lead Channel Setting Pacing Amplitude: 2 V
Lead Channel Setting Pacing Amplitude: 2.5 V
Lead Channel Setting Pacing Pulse Width: 0.4 ms
Lead Channel Setting Pacing Pulse Width: 1 ms
Lead Channel Setting Sensing Sensitivity: 2 mV

## 2022-09-09 NOTE — Progress Notes (Signed)
Remote pacemaker transmission.   

## 2022-09-20 DIAGNOSIS — Z7901 Long term (current) use of anticoagulants: Secondary | ICD-10-CM | POA: Diagnosis not present

## 2022-10-06 DIAGNOSIS — Z23 Encounter for immunization: Secondary | ICD-10-CM | POA: Diagnosis not present

## 2022-10-06 DIAGNOSIS — Z7901 Long term (current) use of anticoagulants: Secondary | ICD-10-CM | POA: Diagnosis not present

## 2022-10-06 DIAGNOSIS — I4891 Unspecified atrial fibrillation: Secondary | ICD-10-CM | POA: Diagnosis not present

## 2022-10-06 DIAGNOSIS — Z0000 Encounter for general adult medical examination without abnormal findings: Secondary | ICD-10-CM | POA: Diagnosis not present

## 2022-10-06 DIAGNOSIS — Z952 Presence of prosthetic heart valve: Secondary | ICD-10-CM | POA: Diagnosis not present

## 2022-10-06 DIAGNOSIS — R972 Elevated prostate specific antigen [PSA]: Secondary | ICD-10-CM | POA: Diagnosis not present

## 2022-10-06 DIAGNOSIS — E7800 Pure hypercholesterolemia, unspecified: Secondary | ICD-10-CM | POA: Diagnosis not present

## 2022-10-20 DIAGNOSIS — Z7901 Long term (current) use of anticoagulants: Secondary | ICD-10-CM | POA: Diagnosis not present

## 2022-11-22 ENCOUNTER — Ambulatory Visit (INDEPENDENT_AMBULATORY_CARE_PROVIDER_SITE_OTHER): Payer: Medicare HMO

## 2022-11-22 DIAGNOSIS — I442 Atrioventricular block, complete: Secondary | ICD-10-CM

## 2022-11-24 LAB — CUP PACEART REMOTE DEVICE CHECK
Battery Remaining Longevity: 14 mo
Battery Voltage: 2.82 V
Brady Statistic RA Percent Paced: 3.97 %
Brady Statistic RV Percent Paced: 89.12 %
Date Time Interrogation Session: 20240116133727
Implantable Lead Connection Status: 753985
Implantable Lead Connection Status: 753985
Implantable Lead Connection Status: 753985
Implantable Lead Implant Date: 20180111
Implantable Lead Implant Date: 20180111
Implantable Lead Implant Date: 20180111
Implantable Lead Location: 753858
Implantable Lead Location: 753859
Implantable Lead Location: 753860
Implantable Lead Model: 4398
Implantable Lead Model: 5076
Implantable Lead Model: 5076
Implantable Pulse Generator Implant Date: 20180111
Lead Channel Impedance Value: 323 Ohm
Lead Channel Impedance Value: 342 Ohm
Lead Channel Impedance Value: 437 Ohm
Lead Channel Impedance Value: 494 Ohm
Lead Channel Impedance Value: 513 Ohm
Lead Channel Impedance Value: 551 Ohm
Lead Channel Impedance Value: 665 Ohm
Lead Channel Impedance Value: 703 Ohm
Lead Channel Impedance Value: 931 Ohm
Lead Channel Pacing Threshold Amplitude: 0.75 V
Lead Channel Pacing Threshold Amplitude: 0.875 V
Lead Channel Pacing Threshold Amplitude: 0.875 V
Lead Channel Pacing Threshold Pulse Width: 0.4 ms
Lead Channel Pacing Threshold Pulse Width: 0.4 ms
Lead Channel Pacing Threshold Pulse Width: 1 ms
Lead Channel Sensing Intrinsic Amplitude: 0.625 mV
Lead Channel Sensing Intrinsic Amplitude: 0.625 mV
Lead Channel Sensing Intrinsic Amplitude: 4.875 mV
Lead Channel Sensing Intrinsic Amplitude: 4.875 mV
Lead Channel Setting Pacing Amplitude: 1.5 V
Lead Channel Setting Pacing Amplitude: 2 V
Lead Channel Setting Pacing Amplitude: 2.5 V
Lead Channel Setting Pacing Pulse Width: 0.4 ms
Lead Channel Setting Pacing Pulse Width: 1 ms
Lead Channel Setting Sensing Sensitivity: 2 mV
Zone Setting Status: 755011
Zone Setting Status: 755011

## 2022-11-30 DIAGNOSIS — Z7901 Long term (current) use of anticoagulants: Secondary | ICD-10-CM | POA: Diagnosis not present

## 2022-12-28 DIAGNOSIS — Z7901 Long term (current) use of anticoagulants: Secondary | ICD-10-CM | POA: Diagnosis not present

## 2023-01-03 NOTE — Progress Notes (Signed)
Remote pacemaker transmission.   

## 2023-01-11 DIAGNOSIS — Z7901 Long term (current) use of anticoagulants: Secondary | ICD-10-CM | POA: Diagnosis not present

## 2023-02-08 DIAGNOSIS — Z7901 Long term (current) use of anticoagulants: Secondary | ICD-10-CM | POA: Diagnosis not present

## 2023-02-21 ENCOUNTER — Ambulatory Visit (INDEPENDENT_AMBULATORY_CARE_PROVIDER_SITE_OTHER): Payer: Medicare HMO

## 2023-02-21 DIAGNOSIS — I442 Atrioventricular block, complete: Secondary | ICD-10-CM | POA: Diagnosis not present

## 2023-02-22 LAB — CUP PACEART REMOTE DEVICE CHECK
Battery Remaining Longevity: 11 mo
Battery Voltage: 2.77 V
Brady Statistic RA Percent Paced: 5.94 %
Brady Statistic RV Percent Paced: 89.85 %
Date Time Interrogation Session: 20240415121411
Implantable Lead Connection Status: 753985
Implantable Lead Connection Status: 753985
Implantable Lead Connection Status: 753985
Implantable Lead Implant Date: 20180111
Implantable Lead Implant Date: 20180111
Implantable Lead Implant Date: 20180111
Implantable Lead Location: 753858
Implantable Lead Location: 753859
Implantable Lead Location: 753860
Implantable Lead Model: 4398
Implantable Lead Model: 5076
Implantable Lead Model: 5076
Implantable Pulse Generator Implant Date: 20180111
Lead Channel Impedance Value: 342 Ohm
Lead Channel Impedance Value: 342 Ohm
Lead Channel Impedance Value: 456 Ohm
Lead Channel Impedance Value: 494 Ohm
Lead Channel Impedance Value: 513 Ohm
Lead Channel Impedance Value: 589 Ohm
Lead Channel Impedance Value: 665 Ohm
Lead Channel Impedance Value: 741 Ohm
Lead Channel Impedance Value: 969 Ohm
Lead Channel Pacing Threshold Amplitude: 0.75 V
Lead Channel Pacing Threshold Amplitude: 1 V
Lead Channel Pacing Threshold Amplitude: 1.125 V
Lead Channel Pacing Threshold Pulse Width: 0.4 ms
Lead Channel Pacing Threshold Pulse Width: 0.4 ms
Lead Channel Pacing Threshold Pulse Width: 1 ms
Lead Channel Sensing Intrinsic Amplitude: 0.375 mV
Lead Channel Sensing Intrinsic Amplitude: 0.375 mV
Lead Channel Sensing Intrinsic Amplitude: 6 mV
Lead Channel Sensing Intrinsic Amplitude: 6 mV
Lead Channel Setting Pacing Amplitude: 1.75 V
Lead Channel Setting Pacing Amplitude: 2 V
Lead Channel Setting Pacing Amplitude: 2.5 V
Lead Channel Setting Pacing Pulse Width: 0.4 ms
Lead Channel Setting Pacing Pulse Width: 1 ms
Lead Channel Setting Sensing Sensitivity: 2 mV
Zone Setting Status: 755011
Zone Setting Status: 755011

## 2023-03-10 DIAGNOSIS — Z7901 Long term (current) use of anticoagulants: Secondary | ICD-10-CM | POA: Diagnosis not present

## 2023-03-24 DIAGNOSIS — Z7901 Long term (current) use of anticoagulants: Secondary | ICD-10-CM | POA: Diagnosis not present

## 2023-03-28 NOTE — Progress Notes (Signed)
Remote pacemaker transmission.   

## 2023-04-21 DIAGNOSIS — Z7901 Long term (current) use of anticoagulants: Secondary | ICD-10-CM | POA: Diagnosis not present

## 2023-05-19 DIAGNOSIS — Z7901 Long term (current) use of anticoagulants: Secondary | ICD-10-CM | POA: Diagnosis not present

## 2023-06-12 ENCOUNTER — Emergency Department (HOSPITAL_BASED_OUTPATIENT_CLINIC_OR_DEPARTMENT_OTHER): Payer: Medicare HMO

## 2023-06-12 ENCOUNTER — Emergency Department (HOSPITAL_BASED_OUTPATIENT_CLINIC_OR_DEPARTMENT_OTHER)
Admission: EM | Admit: 2023-06-12 | Discharge: 2023-06-12 | Disposition: A | Discharge status: Home / Self Care | Payer: Medicare HMO | Attending: Emergency Medicine | Admitting: Emergency Medicine

## 2023-06-12 ENCOUNTER — Other Ambulatory Visit: Payer: Self-pay

## 2023-06-12 ENCOUNTER — Encounter (HOSPITAL_BASED_OUTPATIENT_CLINIC_OR_DEPARTMENT_OTHER): Payer: Self-pay | Admitting: Emergency Medicine

## 2023-06-12 DIAGNOSIS — S0990XA Unspecified injury of head, initial encounter: Secondary | ICD-10-CM | POA: Insufficient documentation

## 2023-06-12 DIAGNOSIS — W500XXA Accidental hit or strike by another person, initial encounter: Secondary | ICD-10-CM | POA: Diagnosis not present

## 2023-06-12 DIAGNOSIS — Z7982 Long term (current) use of aspirin: Secondary | ICD-10-CM | POA: Diagnosis not present

## 2023-06-12 DIAGNOSIS — S0181XA Laceration without foreign body of other part of head, initial encounter: Secondary | ICD-10-CM | POA: Insufficient documentation

## 2023-06-12 DIAGNOSIS — Z7901 Long term (current) use of anticoagulants: Secondary | ICD-10-CM | POA: Insufficient documentation

## 2023-06-12 DIAGNOSIS — Z23 Encounter for immunization: Secondary | ICD-10-CM | POA: Diagnosis not present

## 2023-06-12 DIAGNOSIS — S0993XA Unspecified injury of face, initial encounter: Secondary | ICD-10-CM | POA: Diagnosis present

## 2023-06-12 LAB — PROTIME-INR
INR: 2.9 — ABNORMAL HIGH (ref 0.8–1.2)
Prothrombin Time: 30.2 seconds — ABNORMAL HIGH (ref 11.4–15.2)

## 2023-06-12 MED ORDER — LIDOCAINE-EPINEPHRINE (PF) 2 %-1:200000 IJ SOLN
10.0000 mL | Freq: Once | INTRAMUSCULAR | Status: AC
  Administered 2023-06-12: 10 mL
  Filled 2023-06-12: qty 20

## 2023-06-12 MED ORDER — TETANUS-DIPHTH-ACELL PERTUSSIS 5-2.5-18.5 LF-MCG/0.5 IM SUSY
0.5000 mL | PREFILLED_SYRINGE | Freq: Once | INTRAMUSCULAR | Status: AC
  Administered 2023-06-12: 0.5 mL via INTRAMUSCULAR
  Filled 2023-06-12: qty 0.5

## 2023-06-12 NOTE — Discharge Instructions (Signed)
The stitches can can come out in around 5 to 7 days.
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
## 2023-06-20 DIAGNOSIS — I4891 Unspecified atrial fibrillation: Secondary | ICD-10-CM | POA: Diagnosis not present

## 2023-06-20 DIAGNOSIS — I442 Atrioventricular block, complete: Secondary | ICD-10-CM | POA: Diagnosis not present

## 2023-06-20 DIAGNOSIS — D6869 Other thrombophilia: Secondary | ICD-10-CM | POA: Diagnosis not present

## 2023-06-20 DIAGNOSIS — S0191XD Laceration without foreign body of unspecified part of head, subsequent encounter: Secondary | ICD-10-CM | POA: Diagnosis not present

## 2023-06-29 ENCOUNTER — Ambulatory Visit: Payer: Managed Care, Other (non HMO)

## 2023-06-30 LAB — CUP PACEART REMOTE DEVICE CHECK
Battery Remaining Longevity: 6 mo
Battery Voltage: 2.66 V
Brady Statistic RA Percent Paced: 6.65 %
Brady Statistic RV Percent Paced: 89.35 %
Date Time Interrogation Session: 20240821190647
Implantable Lead Connection Status: 753985
Implantable Lead Connection Status: 753985
Implantable Lead Connection Status: 753985
Implantable Lead Implant Date: 20180111
Implantable Lead Implant Date: 20180111
Implantable Lead Implant Date: 20180111
Implantable Lead Location: 753858
Implantable Lead Location: 753859
Implantable Lead Location: 753860
Implantable Lead Model: 4398
Implantable Lead Model: 5076
Implantable Lead Model: 5076
Implantable Pulse Generator Implant Date: 20180111
Lead Channel Impedance Value: 342 Ohm
Lead Channel Impedance Value: 342 Ohm
Lead Channel Impedance Value: 456 Ohm
Lead Channel Impedance Value: 494 Ohm
Lead Channel Impedance Value: 494 Ohm
Lead Channel Impedance Value: 570 Ohm
Lead Channel Impedance Value: 627 Ohm
Lead Channel Impedance Value: 703 Ohm
Lead Channel Impedance Value: 931 Ohm
Lead Channel Pacing Threshold Amplitude: 0.75 V
Lead Channel Pacing Threshold Amplitude: 0.875 V
Lead Channel Pacing Threshold Amplitude: 1.125 V
Lead Channel Pacing Threshold Pulse Width: 0.4 ms
Lead Channel Pacing Threshold Pulse Width: 0.4 ms
Lead Channel Pacing Threshold Pulse Width: 1 ms
Lead Channel Sensing Intrinsic Amplitude: 0.875 mV
Lead Channel Sensing Intrinsic Amplitude: 0.875 mV
Lead Channel Sensing Intrinsic Amplitude: 3.5 mV
Lead Channel Sensing Intrinsic Amplitude: 3.5 mV
Lead Channel Setting Pacing Amplitude: 1.75 V
Lead Channel Setting Pacing Amplitude: 2 V
Lead Channel Setting Pacing Amplitude: 2.5 V
Lead Channel Setting Pacing Pulse Width: 0.4 ms
Lead Channel Setting Pacing Pulse Width: 1 ms
Lead Channel Setting Sensing Sensitivity: 2 mV
Zone Setting Status: 755011
Zone Setting Status: 755011

## 2023-07-13 DIAGNOSIS — Z7901 Long term (current) use of anticoagulants: Secondary | ICD-10-CM | POA: Diagnosis not present

## 2023-08-01 ENCOUNTER — Ambulatory Visit (INDEPENDENT_AMBULATORY_CARE_PROVIDER_SITE_OTHER): Payer: Medicare HMO

## 2023-08-01 DIAGNOSIS — I442 Atrioventricular block, complete: Secondary | ICD-10-CM

## 2023-08-01 LAB — CUP PACEART REMOTE DEVICE CHECK
Battery Remaining Longevity: 5 mo
Battery Voltage: 2.63 V
Brady Statistic RA Percent Paced: 8.03 %
Brady Statistic RV Percent Paced: 90.83 %
Date Time Interrogation Session: 20240923020458
Implantable Lead Connection Status: 753985
Implantable Lead Connection Status: 753985
Implantable Lead Connection Status: 753985
Implantable Lead Implant Date: 20180111
Implantable Lead Implant Date: 20180111
Implantable Lead Implant Date: 20180111
Implantable Lead Location: 753858
Implantable Lead Location: 753859
Implantable Lead Location: 753860
Implantable Lead Model: 4398
Implantable Lead Model: 5076
Implantable Lead Model: 5076
Implantable Pulse Generator Implant Date: 20180111
Lead Channel Impedance Value: 323 Ohm
Lead Channel Impedance Value: 323 Ohm
Lead Channel Impedance Value: 456 Ohm
Lead Channel Impedance Value: 475 Ohm
Lead Channel Impedance Value: 513 Ohm
Lead Channel Impedance Value: 551 Ohm
Lead Channel Impedance Value: 665 Ohm
Lead Channel Impedance Value: 703 Ohm
Lead Channel Impedance Value: 931 Ohm
Lead Channel Pacing Threshold Amplitude: 0.75 V
Lead Channel Pacing Threshold Amplitude: 0.875 V
Lead Channel Pacing Threshold Amplitude: 1 V
Lead Channel Pacing Threshold Pulse Width: 0.4 ms
Lead Channel Pacing Threshold Pulse Width: 0.4 ms
Lead Channel Pacing Threshold Pulse Width: 1 ms
Lead Channel Sensing Intrinsic Amplitude: 0.75 mV
Lead Channel Sensing Intrinsic Amplitude: 0.75 mV
Lead Channel Sensing Intrinsic Amplitude: 3.25 mV
Lead Channel Sensing Intrinsic Amplitude: 3.25 mV
Lead Channel Setting Pacing Amplitude: 1.5 V
Lead Channel Setting Pacing Amplitude: 2 V
Lead Channel Setting Pacing Amplitude: 2.5 V
Lead Channel Setting Pacing Pulse Width: 0.4 ms
Lead Channel Setting Pacing Pulse Width: 1 ms
Lead Channel Setting Sensing Sensitivity: 2 mV
Zone Setting Status: 755011
Zone Setting Status: 755011

## 2023-08-10 DIAGNOSIS — Z7901 Long term (current) use of anticoagulants: Secondary | ICD-10-CM | POA: Diagnosis not present

## 2023-08-12 NOTE — Progress Notes (Signed)
Remote pacemaker transmission.   

## 2023-09-01 ENCOUNTER — Ambulatory Visit (INDEPENDENT_AMBULATORY_CARE_PROVIDER_SITE_OTHER): Payer: Medicare HMO

## 2023-09-01 DIAGNOSIS — I442 Atrioventricular block, complete: Secondary | ICD-10-CM | POA: Diagnosis not present

## 2023-09-02 LAB — CUP PACEART REMOTE DEVICE CHECK
Battery Remaining Longevity: 4 mo
Battery Voltage: 2.62 V
Brady Statistic RA Percent Paced: 9.36 %
Brady Statistic RV Percent Paced: 86.96 %
Date Time Interrogation Session: 20241024184703
Implantable Lead Connection Status: 753985
Implantable Lead Connection Status: 753985
Implantable Lead Connection Status: 753985
Implantable Lead Implant Date: 20180111
Implantable Lead Implant Date: 20180111
Implantable Lead Implant Date: 20180111
Implantable Lead Location: 753858
Implantable Lead Location: 753859
Implantable Lead Location: 753860
Implantable Lead Model: 4398
Implantable Lead Model: 5076
Implantable Lead Model: 5076
Implantable Pulse Generator Implant Date: 20180111
Lead Channel Impedance Value: 323 Ohm
Lead Channel Impedance Value: 323 Ohm
Lead Channel Impedance Value: 456 Ohm
Lead Channel Impedance Value: 475 Ohm
Lead Channel Impedance Value: 494 Ohm
Lead Channel Impedance Value: 532 Ohm
Lead Channel Impedance Value: 627 Ohm
Lead Channel Impedance Value: 684 Ohm
Lead Channel Impedance Value: 893 Ohm
Lead Channel Pacing Threshold Amplitude: 0.75 V
Lead Channel Pacing Threshold Amplitude: 0.875 V
Lead Channel Pacing Threshold Amplitude: 1.125 V
Lead Channel Pacing Threshold Pulse Width: 0.4 ms
Lead Channel Pacing Threshold Pulse Width: 0.4 ms
Lead Channel Pacing Threshold Pulse Width: 1 ms
Lead Channel Sensing Intrinsic Amplitude: 0.375 mV
Lead Channel Sensing Intrinsic Amplitude: 0.375 mV
Lead Channel Sensing Intrinsic Amplitude: 4.625 mV
Lead Channel Sensing Intrinsic Amplitude: 4.625 mV
Lead Channel Setting Pacing Amplitude: 1.75 V
Lead Channel Setting Pacing Amplitude: 2 V
Lead Channel Setting Pacing Amplitude: 2.5 V
Lead Channel Setting Pacing Pulse Width: 0.4 ms
Lead Channel Setting Pacing Pulse Width: 1 ms
Lead Channel Setting Sensing Sensitivity: 2 mV
Zone Setting Status: 755011
Zone Setting Status: 755011

## 2023-09-13 DIAGNOSIS — Z7901 Long term (current) use of anticoagulants: Secondary | ICD-10-CM | POA: Diagnosis not present

## 2023-09-13 DIAGNOSIS — Z23 Encounter for immunization: Secondary | ICD-10-CM | POA: Diagnosis not present

## 2023-09-20 NOTE — Progress Notes (Signed)
Remote pacemaker transmission.   

## 2023-10-03 ENCOUNTER — Ambulatory Visit: Payer: Medicare HMO

## 2023-10-03 DIAGNOSIS — I442 Atrioventricular block, complete: Secondary | ICD-10-CM

## 2023-10-03 LAB — CUP PACEART REMOTE DEVICE CHECK
Battery Remaining Longevity: 3 mo
Battery Voltage: 2.61 V
Brady Statistic RA Percent Paced: 9.47 %
Brady Statistic RV Percent Paced: 89.35 %
Date Time Interrogation Session: 20241125011700
Implantable Lead Connection Status: 753985
Implantable Lead Connection Status: 753985
Implantable Lead Connection Status: 753985
Implantable Lead Implant Date: 20180111
Implantable Lead Implant Date: 20180111
Implantable Lead Implant Date: 20180111
Implantable Lead Location: 753858
Implantable Lead Location: 753859
Implantable Lead Location: 753860
Implantable Lead Model: 4398
Implantable Lead Model: 5076
Implantable Lead Model: 5076
Implantable Pulse Generator Implant Date: 20180111
Lead Channel Impedance Value: 323 Ohm
Lead Channel Impedance Value: 342 Ohm
Lead Channel Impedance Value: 437 Ohm
Lead Channel Impedance Value: 475 Ohm
Lead Channel Impedance Value: 532 Ohm
Lead Channel Impedance Value: 589 Ohm
Lead Channel Impedance Value: 665 Ohm
Lead Channel Impedance Value: 703 Ohm
Lead Channel Impedance Value: 969 Ohm
Lead Channel Pacing Threshold Amplitude: 0.75 V
Lead Channel Pacing Threshold Amplitude: 0.875 V
Lead Channel Pacing Threshold Amplitude: 0.875 V
Lead Channel Pacing Threshold Pulse Width: 0.4 ms
Lead Channel Pacing Threshold Pulse Width: 0.4 ms
Lead Channel Pacing Threshold Pulse Width: 1 ms
Lead Channel Sensing Intrinsic Amplitude: 0.375 mV
Lead Channel Sensing Intrinsic Amplitude: 0.375 mV
Lead Channel Sensing Intrinsic Amplitude: 3.875 mV
Lead Channel Sensing Intrinsic Amplitude: 3.875 mV
Lead Channel Setting Pacing Amplitude: 1.5 V
Lead Channel Setting Pacing Amplitude: 2 V
Lead Channel Setting Pacing Amplitude: 2.5 V
Lead Channel Setting Pacing Pulse Width: 0.4 ms
Lead Channel Setting Pacing Pulse Width: 1 ms
Lead Channel Setting Sensing Sensitivity: 2 mV
Zone Setting Status: 755011
Zone Setting Status: 755011

## 2023-10-05 DIAGNOSIS — E7800 Pure hypercholesterolemia, unspecified: Secondary | ICD-10-CM | POA: Diagnosis not present

## 2023-10-05 DIAGNOSIS — R319 Hematuria, unspecified: Secondary | ICD-10-CM | POA: Diagnosis not present

## 2023-10-05 DIAGNOSIS — R972 Elevated prostate specific antigen [PSA]: Secondary | ICD-10-CM | POA: Diagnosis not present

## 2023-10-05 DIAGNOSIS — I4891 Unspecified atrial fibrillation: Secondary | ICD-10-CM | POA: Diagnosis not present

## 2023-10-10 DIAGNOSIS — Z0000 Encounter for general adult medical examination without abnormal findings: Secondary | ICD-10-CM | POA: Diagnosis not present

## 2023-10-10 DIAGNOSIS — E7800 Pure hypercholesterolemia, unspecified: Secondary | ICD-10-CM | POA: Diagnosis not present

## 2023-10-10 DIAGNOSIS — D6869 Other thrombophilia: Secondary | ICD-10-CM | POA: Diagnosis not present

## 2023-10-10 DIAGNOSIS — I4891 Unspecified atrial fibrillation: Secondary | ICD-10-CM | POA: Diagnosis not present

## 2023-10-10 DIAGNOSIS — Z952 Presence of prosthetic heart valve: Secondary | ICD-10-CM | POA: Diagnosis not present

## 2023-10-10 DIAGNOSIS — R319 Hematuria, unspecified: Secondary | ICD-10-CM | POA: Diagnosis not present

## 2023-10-10 DIAGNOSIS — R972 Elevated prostate specific antigen [PSA]: Secondary | ICD-10-CM | POA: Diagnosis not present

## 2023-10-10 DIAGNOSIS — I5022 Chronic systolic (congestive) heart failure: Secondary | ICD-10-CM | POA: Diagnosis not present

## 2023-10-10 DIAGNOSIS — Z7901 Long term (current) use of anticoagulants: Secondary | ICD-10-CM | POA: Diagnosis not present

## 2023-10-10 DIAGNOSIS — Z1211 Encounter for screening for malignant neoplasm of colon: Secondary | ICD-10-CM | POA: Diagnosis not present

## 2023-10-12 ENCOUNTER — Encounter: Payer: Self-pay | Admitting: Emergency Medicine

## 2023-10-17 ENCOUNTER — Other Ambulatory Visit: Payer: Self-pay | Admitting: Emergency Medicine

## 2023-10-17 DIAGNOSIS — F17210 Nicotine dependence, cigarettes, uncomplicated: Secondary | ICD-10-CM

## 2023-10-17 DIAGNOSIS — Z122 Encounter for screening for malignant neoplasm of respiratory organs: Secondary | ICD-10-CM

## 2023-10-17 DIAGNOSIS — Z87891 Personal history of nicotine dependence: Secondary | ICD-10-CM

## 2023-10-28 NOTE — Progress Notes (Signed)
Remote pacemaker transmission.   

## 2023-11-03 ENCOUNTER — Ambulatory Visit (INDEPENDENT_AMBULATORY_CARE_PROVIDER_SITE_OTHER): Payer: Medicare HMO | Admitting: Acute Care

## 2023-11-03 DIAGNOSIS — F17210 Nicotine dependence, cigarettes, uncomplicated: Secondary | ICD-10-CM

## 2023-11-03 NOTE — Progress Notes (Signed)
 Provider Attestation I agree with the documentation of the Shared Decision Making visit,  smoking cessation counseling if appropriate, and verification or eligibility for lung cancer screening as documented by the RN Nurse Navigator.   Raejean Bullock, MSN, AGACNP-BC [facility] See Amion for personal pager PCCM on call pager [phone]    Virtual Visit via Telephone Note  I connected with [name] [name] on 11/03/23 at  3:30 PM EST by telephone and verified that I am speaking with the correct person using two identifiers.  Location: Patient: in home Provider: [address] [place], [place], [address]    Shared Decision Making Visit Lung Cancer Screening Program [phone])   Eligibility: Age 67 y.o. Pack Years Smoking History Calculation 47 (# packs/per year x # years smoked) Recent History of coughing up blood  no Unexplained weight loss? no ( >Than 15 pounds within the last 6 months ) Prior History Lung / other cancer no (Diagnosis within the last 5 years already requiring surveillance chest CT Scans). Smoking Status Current Smoker Former Smokers: Years since quit:  NA  Quit Date: NA  Visit Components: Discussion included one or more decision making aids. yes Discussion included risk/benefits of screening. yes Discussion included potential follow up diagnostic testing for abnormal scans. yes Discussion included meaning and risk of over diagnosis. yes Discussion included meaning and risk of False Positives. yes Discussion included meaning of total radiation exposure. yes  Counseling Included: Importance of adherence to annual lung cancer LDCT screening. yes Impact of comorbidities on ability to participate in the program. yes Ability and willingness to under diagnostic treatment. yes  Smoking Cessation Counseling: Current Smokers:  Discussed importance of smoking cessation. yes Information about tobacco cessation classes and  interventions provided to patient. yes Patient provided with "ticket" for LDCT Scan. yes Symptomatic Patient. no  Counseling NA Diagnosis Code: Tobacco Use Z72.0 Asymptomatic Patient yes  Counseling (Intermediate counseling: > three minutes counseling) [id number] Former Smokers:  Discussed the importance of maintaining cigarette abstinence. yes Diagnosis Code: Personal History of Nicotine Dependence. O13.086 Information about tobacco cessation classes and interventions provided to patient. Yes Patient provided with "ticket" for LDCT Scan. yes Written Order for Lung Cancer Screening with LDCT placed in Epic. Yes (CT Chest Lung Cancer Screening Low Dose W/O CM) [id number] Z12.2-Screening of respiratory organs Z87.891-Personal history of nicotine dependence   [name] Gaskins, RN 11/03/23

## 2023-11-03 NOTE — Patient Instructions (Signed)

## 2023-11-04 ENCOUNTER — Ambulatory Visit (HOSPITAL_BASED_OUTPATIENT_CLINIC_OR_DEPARTMENT_OTHER)
Admission: RE | Admit: 2023-11-04 | Discharge: 2023-11-04 | Disposition: A | Discharge status: Home / Self Care | Payer: Medicare HMO | Source: Ambulatory Visit | Attending: Acute Care | Admitting: Acute Care

## 2023-11-04 DIAGNOSIS — Z87891 Personal history of nicotine dependence: Secondary | ICD-10-CM | POA: Diagnosis not present

## 2023-11-04 DIAGNOSIS — F17210 Nicotine dependence, cigarettes, uncomplicated: Secondary | ICD-10-CM | POA: Insufficient documentation

## 2023-11-04 DIAGNOSIS — Z122 Encounter for screening for malignant neoplasm of respiratory organs: Secondary | ICD-10-CM | POA: Diagnosis not present

## 2023-11-14 ENCOUNTER — Other Ambulatory Visit: Payer: Self-pay

## 2023-11-14 DIAGNOSIS — Z122 Encounter for screening for malignant neoplasm of respiratory organs: Secondary | ICD-10-CM

## 2023-11-14 DIAGNOSIS — Z87891 Personal history of nicotine dependence: Secondary | ICD-10-CM

## 2023-11-14 DIAGNOSIS — F17210 Nicotine dependence, cigarettes, uncomplicated: Secondary | ICD-10-CM

## 2023-11-25 ENCOUNTER — Ambulatory Visit: Payer: Medicare HMO | Attending: Internal Medicine | Admitting: Internal Medicine

## 2023-11-25 ENCOUNTER — Encounter: Payer: Self-pay | Admitting: Internal Medicine

## 2023-11-25 VITALS — BP 118/70 | HR 87 | Ht 70.0 in | Wt 183.6 lb

## 2023-11-25 DIAGNOSIS — I4819 Other persistent atrial fibrillation: Secondary | ICD-10-CM

## 2023-11-25 DIAGNOSIS — Z950 Presence of cardiac pacemaker: Secondary | ICD-10-CM

## 2023-11-25 LAB — CUP PACEART INCLINIC DEVICE CHECK
Battery Remaining Longevity: 1 mo
Battery Voltage: 2.61 V
Brady Statistic AP VP Percent: 73.52 %
Brady Statistic AP VS Percent: 0.31 %
Brady Statistic AS VP Percent: 24.3 %
Brady Statistic AS VS Percent: 1.84 %
Brady Statistic RA Percent Paced: 43.7 %
Brady Statistic RV Percent Paced: 93.91 %
Date Time Interrogation Session: 20250117103527
Implantable Lead Connection Status: 753985
Implantable Lead Connection Status: 753985
Implantable Lead Connection Status: 753985
Implantable Lead Implant Date: 20180111
Implantable Lead Implant Date: 20180111
Implantable Lead Implant Date: 20180111
Implantable Lead Location: 753858
Implantable Lead Location: 753859
Implantable Lead Location: 753860
Implantable Lead Model: 4398
Implantable Lead Model: 5076
Implantable Lead Model: 5076
Implantable Pulse Generator Implant Date: 20180111
Lead Channel Impedance Value: 1064 Ohm
Lead Channel Impedance Value: 342 Ohm
Lead Channel Impedance Value: 361 Ohm
Lead Channel Impedance Value: 475 Ohm
Lead Channel Impedance Value: 513 Ohm
Lead Channel Impedance Value: 589 Ohm
Lead Channel Impedance Value: 608 Ohm
Lead Channel Impedance Value: 760 Ohm
Lead Channel Impedance Value: 779 Ohm
Lead Channel Pacing Threshold Amplitude: 0.75 V
Lead Channel Pacing Threshold Amplitude: 0.875 V
Lead Channel Pacing Threshold Amplitude: 1.125 V
Lead Channel Pacing Threshold Pulse Width: 0.4 ms
Lead Channel Pacing Threshold Pulse Width: 0.4 ms
Lead Channel Pacing Threshold Pulse Width: 1 ms
Lead Channel Sensing Intrinsic Amplitude: 0.375 mV
Lead Channel Sensing Intrinsic Amplitude: 0.375 mV
Lead Channel Sensing Intrinsic Amplitude: 3.375 mV
Lead Channel Sensing Intrinsic Amplitude: 4.5 mV
Lead Channel Setting Pacing Amplitude: 1.75 V
Lead Channel Setting Pacing Amplitude: 2 V
Lead Channel Setting Pacing Amplitude: 2.5 V
Lead Channel Setting Pacing Pulse Width: 0.4 ms
Lead Channel Setting Pacing Pulse Width: 1 ms
Lead Channel Setting Sensing Sensitivity: 2 mV
Zone Setting Status: 755011
Zone Setting Status: 755011
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
## 2023-11-25 NOTE — Patient Instructions (Addendum)
Medication Instructions:  Your physician recommends that you continue on your current medications as directed. Please refer to the Current Medication list given to you today.  *If you need a refill on your cardiac medications before your next appointment, please call your pharmacy*  Lab Work: CBC Bmet -- Within 30 days of procedure  If you have labs (blood work) drawn today and your tests are completely normal, you will receive your results only by: MyChart Message (if you have MyChart) OR A paper copy in the mail If you have any lab test that is abnormal or we need to change your treatment, we will call you to review the results.  Testing/Procedures: Generator change  Follow-Up: At [facility], you and your health needs are our priority.  As part of our continuing mission to provide you with exceptional heart care, we have created designated Provider Care Teams.  These Care Teams include your primary Cardiologist (physician) and Advanced Practice Providers (APPs -  Physician Assistants and Nurse Practitioners) who all work together to provide you with the care you need, when you need it.  Your next appointment:   To be scheduled  The format for your next appointment:   In Person  Provider:   Lewayne Bunting, MD{or one of the following Advanced Practice Providers on your designated Care Team:   Francis Dowse, [place] Casimiro Needle "Mardelle Matte" [place], [place] Earnest Rosier, NP  Remote monitoring is used to monitor your Pacemaker/ ICD from home. This monitoring reduces the number of office visits required to check your device to one time per year. It allows [place] to keep an eye on the functioning of your device to ensure it is working properly.   Important Information About Sugar

## 2023-12-05 ENCOUNTER — Ambulatory Visit: Payer: Medicare HMO

## 2023-12-05 ENCOUNTER — Encounter: Payer: Self-pay | Admitting: Internal Medicine

## 2023-12-05 DIAGNOSIS — I442 Atrioventricular block, complete: Secondary | ICD-10-CM

## 2023-12-05 LAB — CUP PACEART REMOTE DEVICE CHECK
Battery Remaining Longevity: 1 mo
Battery Voltage: 2.6 V
Brady Statistic RA Percent Paced: 0.2 %
Brady Statistic RV Percent Paced: 88.58 %
Date Time Interrogation Session: 20250127021252
Implantable Lead Connection Status: 753985
Implantable Lead Connection Status: 753985
Implantable Lead Connection Status: 753985
Implantable Lead Implant Date: 20180111
Implantable Lead Implant Date: 20180111
Implantable Lead Implant Date: 20180111
Implantable Lead Location: 753858
Implantable Lead Location: 753859
Implantable Lead Location: 753860
Implantable Lead Model: 4398
Implantable Lead Model: 5076
Implantable Lead Model: 5076
Implantable Pulse Generator Implant Date: 20180111
Lead Channel Impedance Value: 1007 Ohm
Lead Channel Impedance Value: 342 Ohm
Lead Channel Impedance Value: 342 Ohm
Lead Channel Impedance Value: 475 Ohm
Lead Channel Impedance Value: 494 Ohm
Lead Channel Impedance Value: 551 Ohm
Lead Channel Impedance Value: 608 Ohm
Lead Channel Impedance Value: 741 Ohm
Lead Channel Impedance Value: 760 Ohm
Lead Channel Pacing Threshold Amplitude: 0.75 V
Lead Channel Pacing Threshold Amplitude: 0.75 V
Lead Channel Pacing Threshold Amplitude: 1 V
Lead Channel Pacing Threshold Pulse Width: 0.4 ms
Lead Channel Pacing Threshold Pulse Width: 0.4 ms
Lead Channel Pacing Threshold Pulse Width: 1 ms
Lead Channel Sensing Intrinsic Amplitude: 0.375 mV
Lead Channel Sensing Intrinsic Amplitude: 0.375 mV
Lead Channel Sensing Intrinsic Amplitude: 4.375 mV
Lead Channel Sensing Intrinsic Amplitude: 4.375 mV
Lead Channel Setting Pacing Amplitude: 1.5 V
Lead Channel Setting Pacing Amplitude: 2 V
Lead Channel Setting Pacing Amplitude: 2.5 V
Lead Channel Setting Pacing Pulse Width: 0.4 ms
Lead Channel Setting Pacing Pulse Width: 1 ms
Lead Channel Setting Sensing Sensitivity: 2 mV
Zone Setting Status: 755011
Zone Setting Status: 755011

## 2023-12-26 NOTE — Pre-Procedure Instructions (Signed)
Attempted to call patient regarding procedure instructions.  Left voicemail on the following items: Arrival time 0700 Nothing to eat or drink after midnight No meds AM of procedure Responsible person to drive you home and stay with you for 24 hrs Wash with special soap night before and morning of procedure If on anti-coagulant drug instructions Coumadin-last dose should have been Saturday 2/15

## 2023-12-27 ENCOUNTER — Other Ambulatory Visit: Payer: Self-pay

## 2023-12-27 ENCOUNTER — Encounter (HOSPITAL_COMMUNITY)
Admission: RE | Disposition: A | Discharge status: Home / Self Care | Payer: Self-pay | Source: Ambulatory Visit | Attending: Internal Medicine

## 2023-12-27 ENCOUNTER — Ambulatory Visit (HOSPITAL_COMMUNITY)
Admission: RE | Admit: 2023-12-27 | Discharge: 2023-12-27 | Disposition: A | Discharge status: Home / Self Care | Payer: Medicare HMO | Source: Ambulatory Visit | Attending: Internal Medicine | Admitting: Internal Medicine

## 2023-12-27 DIAGNOSIS — Z79899 Other long term (current) drug therapy: Secondary | ICD-10-CM | POA: Insufficient documentation

## 2023-12-27 DIAGNOSIS — Z45010 Encounter for checking and testing of cardiac pacemaker pulse generator [battery]: Secondary | ICD-10-CM | POA: Diagnosis present

## 2023-12-27 DIAGNOSIS — Z7901 Long term (current) use of anticoagulants: Secondary | ICD-10-CM | POA: Diagnosis not present

## 2023-12-27 DIAGNOSIS — I4819 Other persistent atrial fibrillation: Secondary | ICD-10-CM | POA: Diagnosis not present

## 2023-12-27 DIAGNOSIS — I442 Atrioventricular block, complete: Secondary | ICD-10-CM | POA: Diagnosis not present

## 2023-12-27 DIAGNOSIS — Z87891 Personal history of nicotine dependence: Secondary | ICD-10-CM | POA: Diagnosis not present

## 2023-12-27 DIAGNOSIS — I480 Paroxysmal atrial fibrillation: Secondary | ICD-10-CM

## 2023-12-27 DIAGNOSIS — I5022 Chronic systolic (congestive) heart failure: Secondary | ICD-10-CM | POA: Insufficient documentation

## 2023-12-27 HISTORY — PX: BIV PACEMAKER GENERATOR CHANGEOUT: EP1198

## 2023-12-27 LAB — CBC
HCT: 42.3 % (ref 39.0–52.0)
Hemoglobin: 13.9 g/dL (ref 13.0–17.0)
MCH: 30.5 pg (ref 26.0–34.0)
MCHC: 32.9 g/dL (ref 30.0–36.0)
MCV: 93 fL (ref 80.0–100.0)
Platelets: 266 10*3/uL (ref 150–400)
RBC: 4.55 MIL/uL (ref 4.22–5.81)
RDW: 13.7 % (ref 11.5–15.5)
WBC: 7.5 10*3/uL (ref 4.0–10.5)
nRBC: 0 % (ref 0.0–0.2)

## 2023-12-27 LAB — BASIC METABOLIC PANEL
Anion gap: 11 (ref 5–15)
BUN: 17 mg/dL (ref 8–23)
CO2: 22 mmol/L (ref 22–32)
Calcium: 9.1 mg/dL (ref 8.9–10.3)
Chloride: 103 mmol/L (ref 98–111)
Creatinine, Ser: 0.9 mg/dL (ref 0.61–1.24)
GFR, Estimated: >60 mL/min (ref >60)
Glucose, Bld: 95 mg/dL (ref 70–99)
Potassium: 4.6 mmol/L (ref 3.5–5.1)
Sodium: 136 mmol/L (ref 135–145)

## 2023-12-27 LAB — PROTIME-INR
INR: 1.3 — ABNORMAL HIGH (ref 0.8–1.2)
Prothrombin Time: 16 s — ABNORMAL HIGH (ref 11.4–15.2)

## 2023-12-27 SURGERY — BIV PACEMAKER GENERATOR CHANGEOUT
Anesthesia: LOCAL

## 2023-12-27 MED ORDER — POVIDONE-IODINE 10 % EX SWAB: 2.0000 | Freq: Once | CUTANEOUS | Status: DC

## 2023-12-27 MED ORDER — CEFAZOLIN SODIUM-DEXTROSE 2-4 GM/100ML-% IV SOLN
2.0000 g | INTRAVENOUS | Status: AC
  Administered 2023-12-27: 2 g via INTRAVENOUS

## 2023-12-27 MED ORDER — MIDAZOLAM HCL 2 MG/2ML IJ SOLN
INTRAMUSCULAR | Status: AC
  Filled 2023-12-27: qty 2

## 2023-12-27 MED ORDER — MIDAZOLAM HCL 2 MG/2ML IJ SOLN
INTRAMUSCULAR | Status: AC
Start: 2023-12-27 — End: ?
  Filled 2023-12-27: qty 2

## 2023-12-27 MED ORDER — SODIUM CHLORIDE 0.9 % IV SOLN: INTRAVENOUS | Status: DC

## 2023-12-27 MED ORDER — MIDAZOLAM HCL 5 MG/5ML IJ SOLN
INTRAMUSCULAR | Status: DC | PRN
  Administered 2023-12-27 (×2): 1 mg via INTRAVENOUS

## 2023-12-27 MED ORDER — SODIUM CHLORIDE 0.9 % IV SOLN
INTRAVENOUS | Status: AC
  Filled 2023-12-27: qty 2

## 2023-12-27 MED ORDER — CEFAZOLIN SODIUM-DEXTROSE 2-4 GM/100ML-% IV SOLN
INTRAVENOUS | Status: AC
  Filled 2023-12-27: qty 100

## 2023-12-27 MED ORDER — FENTANYL CITRATE (PF) 100 MCG/2ML IJ SOLN
INTRAMUSCULAR | Status: AC
  Filled 2023-12-27: qty 2

## 2023-12-27 MED ORDER — LIDOCAINE HCL (PF) 1 % IJ SOLN
INTRAMUSCULAR | Status: DC | PRN
Start: 2023-12-27 — End: 2023-12-27
  Administered 2023-12-27: 60 mL

## 2023-12-27 MED ORDER — ACETAMINOPHEN 325 MG PO TABS: 325.0000 mg | ORAL_TABLET | ORAL | Status: DC | PRN

## 2023-12-27 MED ORDER — SODIUM CHLORIDE 0.9 % IV SOLN
80.0000 mg | INTRAVENOUS | Status: AC
  Administered 2023-12-27: 80 mg

## 2023-12-27 MED ORDER — ONDANSETRON HCL 4 MG/2ML IJ SOLN: 4.0000 mg | Freq: Four times a day (QID) | INTRAMUSCULAR | Status: DC | PRN

## 2023-12-27 MED ORDER — FENTANYL CITRATE (PF) 100 MCG/2ML IJ SOLN
INTRAMUSCULAR | Status: DC | PRN
  Administered 2023-12-27 (×2): 12.5 ug via INTRAVENOUS

## 2023-12-27 MED ORDER — CHLORHEXIDINE GLUCONATE 4 % EX SOLN: 4.0000 | Freq: Once | CUTANEOUS | Status: DC

## 2023-12-27 SURGICAL SUPPLY — 7 items
CABLE SURGICAL S-101-97-12 (CABLE) ×1 IMPLANT
PACEMAKER PRCT MRI CRTP W1TR01 (Pacemaker) IMPLANT
PAD DEFIB RADIO PHYSIO CONN (PAD) ×1 IMPLANT
POUCH AIGIS-R ANTIBACT PPM (Mesh General) ×1 IMPLANT
POUCH AIGIS-R ANTIBACT PPM MED (Mesh General) IMPLANT
PPM PRECEPTA MRI CRT-P W1TR01 (Pacemaker) ×1 IMPLANT
TRAY PACEMAKER INSERTION (PACKS) ×1 IMPLANT

## 2023-12-27 NOTE — Discharge Instructions (Signed)
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
## 2023-12-28 ENCOUNTER — Encounter (HOSPITAL_COMMUNITY): Payer: Self-pay | Admitting: Internal Medicine

## 2023-12-30 MED FILL — Midazolam HCl Inj 2 MG/2ML (Base Equivalent): INTRAMUSCULAR | Qty: 2 | Status: AC

## 2024-01-10 NOTE — Patient Instructions (Signed)

## 2024-01-11 ENCOUNTER — Ambulatory Visit: Payer: Medicare HMO | Attending: Cardiology

## 2024-01-11 DIAGNOSIS — I442 Atrioventricular block, complete: Secondary | ICD-10-CM | POA: Diagnosis not present

## 2024-01-11 DIAGNOSIS — I480 Paroxysmal atrial fibrillation: Secondary | ICD-10-CM

## 2024-01-11 LAB — CUP PACEART INCLINIC DEVICE CHECK
Date Time Interrogation Session: 20250305094122
Implantable Lead Connection Status: 753985
Implantable Lead Connection Status: 753985
Implantable Lead Connection Status: 753985
Implantable Lead Implant Date: 20180111
Implantable Lead Implant Date: 20180111
Implantable Lead Implant Date: 20180111
Implantable Lead Location: 753858
Implantable Lead Location: 753859
Implantable Lead Location: 753860
Implantable Lead Model: 4398
Implantable Lead Model: 5076
Implantable Lead Model: 5076
Implantable Pulse Generator Implant Date: 20250218

## 2024-01-11 NOTE — Progress Notes (Signed)
 Normal Pacemaker wound check. Wound well healed. Thresholds, sensing, and impedances consistent with implant measurements and outputs appropriate for chronic leads.  (gen change only). 1 short NSVT 11 beats. No arm restrictions.  Pt enrolled in remote follow-up.    NOTE: patient BIVP effectively: 89.0%  - will continue to monitor.

## 2024-01-12 NOTE — Progress Notes (Signed)
 Remote pacemaker transmission.

## 2024-02-13 ENCOUNTER — Ambulatory Visit: Payer: Medicare HMO

## 2024-02-13 DIAGNOSIS — I442 Atrioventricular block, complete: Secondary | ICD-10-CM

## 2024-02-13 DIAGNOSIS — I480 Paroxysmal atrial fibrillation: Secondary | ICD-10-CM

## 2024-02-14 ENCOUNTER — Encounter: Payer: Self-pay | Admitting: Internal Medicine

## 2024-02-16 LAB — CUP PACEART REMOTE DEVICE CHECK
Battery Remaining Longevity: 108 mo
Battery Voltage: 3.09 V
Brady Statistic AP VP Percent: 0 %
Brady Statistic AP VS Percent: 0 %
Brady Statistic AS VP Percent: 89.14 %
Brady Statistic AS VS Percent: 10.86 %
Brady Statistic RA Percent Paced: 0 %
Brady Statistic RV Percent Paced: 89.14 %
Date Time Interrogation Session: 20250409153455
Implantable Lead Connection Status: 753985
Implantable Lead Connection Status: 753985
Implantable Lead Connection Status: 753985
Implantable Lead Implant Date: 20180111
Implantable Lead Implant Date: 20180111
Implantable Lead Implant Date: 20180111
Implantable Lead Location: 753858
Implantable Lead Location: 753859
Implantable Lead Location: 753860
Implantable Lead Model: 4398
Implantable Lead Model: 5076
Implantable Lead Model: 5076
Implantable Pulse Generator Implant Date: 20250218
Lead Channel Impedance Value: 304 Ohm
Lead Channel Impedance Value: 361 Ohm
Lead Channel Impedance Value: 494 Ohm
Lead Channel Impedance Value: 513 Ohm
Lead Channel Impedance Value: 532 Ohm
Lead Channel Impedance Value: 551 Ohm
Lead Channel Impedance Value: 741 Ohm
Lead Channel Impedance Value: 760 Ohm
Lead Channel Impedance Value: 931 Ohm
Lead Channel Pacing Threshold Amplitude: 1 V
Lead Channel Pacing Threshold Amplitude: 1.125 V
Lead Channel Pacing Threshold Pulse Width: 0.4 ms
Lead Channel Pacing Threshold Pulse Width: 1 ms
Lead Channel Sensing Intrinsic Amplitude: 0.5 mV
Lead Channel Sensing Intrinsic Amplitude: 2.75 mV
Lead Channel Sensing Intrinsic Amplitude: 2.75 mV
Lead Channel Setting Pacing Amplitude: 1.75 V
Lead Channel Setting Pacing Amplitude: 2 V
Lead Channel Setting Pacing Pulse Width: 0.4 ms
Lead Channel Setting Pacing Pulse Width: 1 ms
Lead Channel Setting Sensing Sensitivity: 1.2 mV
Zone Setting Status: 755011

## 2024-02-21 ENCOUNTER — Encounter: Payer: Self-pay | Admitting: Internal Medicine

## 2024-03-26 NOTE — Progress Notes (Signed)
 Remote pacemaker transmission.

## 2024-03-26 NOTE — Addendum Note (Signed)
 Addended by: Edra Govern D on: 03/26/2024 05:18 PM   Modules accepted: Orders

## 2024-03-27 NOTE — Progress Notes (Addendum)
 Cardiology Office Note:  .   Date:  03/27/2024  ID:  [name] [name], DOB [birth year], MRN [id number] PCP: Benedetto Brady, MD  [facility] Providers Cardiologist:  None {  History of Present Illness: .   [name] [name] is a 68 y.o. male w/PMHx of  VHD (Rheumatic) s/p MVR (mechanical), TV repair, also had MAZE and LAA clipping, w/subsdequent CHB w/CRT-P,  PAFib,  chronic CHF (systolic), NICM >> w/recovered LVEF  He last saw Dr. Jordan/cardiology team 02/12/21, reported doing well, planned for echo in a year No changes made  He saw Dr. Carolynne Citron 11/25/23, he was in AFib, rate controlled, ~ 1 mo to ERI and planned for gen change once triggers.  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 Denies any healing concerns No CP, palpitations or cardiac awareness No SOB, DOE No near syncope or syncope  His PMD manages his warfarin, monthly and more frequent of needed Last INR was 3.1   Device information MDT CRT-P implanted 11/18/2016, gen change 12/27/23  Arrhythmia/AAD hx Amiodarone  >> stopped 2018  Studies Reviewed: Aaron Aas    EKG not done today 11/25/23 with excellent QRS morphology, duration of  DEVICE interrogation done today and reviewed by myself Battery and lead measurements are good 87% effective BP 12 episodes labeled monitored VT All are brief and likely RVR (1-2 seconds)   07/21/2018: TTE - Left ventricle: The cavity size was normal. Systolic function was    normal. The estimated ejection fraction was in the range of 55%    to 60%. Wall motion was normal; there were no regional wall    motion abnormalities. The study is not technically sufficient to    allow evaluation of LV diastolic function.  - Mitral valve: A mechanical prosthesis was present and functioning    normally. Mean gradient (D): 4 mm Hg.  Gradients at 70 bpm.  - Left atrium: The atrium was severely dilated.  - Right atrium: The atrium was severely dilated.  - Tricuspid valve: Prior procedures included surgical repair.    Transvalvular velocity was minimally increased due to    annuloplasty ring. Mean gradient (D): 2 mm Hg.    Echo 02/22/17: Study Conclusions - Left ventricle: The cavity size was mildly dilated. Systolic   function was mildly reduced. The estimated ejection fraction was   in the range of 45% to 50%. Hypokinesis of the anteroseptal and   inferoseptal myocardium. The study is not technically sufficient   to allow evaluation of LV diastolic function. - Aortic valve: Transvalvular velocity was within the normal range.   There was no stenosis. There was no regurgitation. - Mitral valve: A mechanical prosthesis was present and functioning   normally. The sewing ring appeared normal. Peak E-wave velocity:   149 cm/s. Mean gradient (D): 4 mm Hg. - Right ventricle: The cavity size was normal. Wall thickness was   normal. Systolic function was normal. - Tricuspid valve: There was no regurgitation.     Risk Assessment/Calculations:    Physical Exam:   VS:  There were no vitals taken for this visit.   Wt Readings from Last 3 Encounters:  12/27/23 180 lb (81.6 kg)  11/25/23 183 lb 9.6 oz (83.3 kg)  02/02/22 175 lb 14.8 oz (79.8 kg)    GEN: Well nourished, well developed  in no acute distress NECK: No JVD; No carotid bruits CARDIAC: RRR (paced), valve is appreciated, no murmurs, rubs, gallops RESPIRATORY:  CTA b/l without rales, wheezing or rhonchi  ABDOMEN: Soft, non-tender, non-distended EXTREMITIES:  No edema; No deformity   PPM site: is stable, well healed,  no thinning, fluctuation, tethering  ASSESSMENT AND PLAN: .    CRT-P intact function no changes mad  Presumed permanent Afib He is programmed VVIR, in AFib today CHA2DS2Vasc is 2, on warfarin  VHD Mechanical MVR, TV repair On  warfarin Needs to get back on track with Dr. Lemuel Quaker  NICM Recovered LVEF by his last echo No symptoms or exam findings of volume OL OptiVol looks good He only effective BP 87%, this is consistent with his numbers historically Last EKG has very good morphology Given clinically he is doing great, no programming changes He is observed to have some VS response pacing today Will defer any add on BB, updated echo etc. to Dr. Yehuda Helms C/w Dr. Yehuda Helms  Secondary hypercoagulable state 2/2 AFib   Dispo: remotes as usual, in c linic in a year with EP, sooner if needed  Signed, Debbie Fails, PA-C

## 2024-03-29 ENCOUNTER — Ambulatory Visit: Payer: Medicare HMO | Attending: Physician Assistant | Admitting: Physician Assistant

## 2024-03-29 VITALS — BP 127/67 | HR 93 | Ht 70.0 in | Wt 181.0 lb

## 2024-03-29 DIAGNOSIS — I428 Other cardiomyopathies: Secondary | ICD-10-CM

## 2024-03-29 DIAGNOSIS — I4821 Permanent atrial fibrillation: Secondary | ICD-10-CM

## 2024-03-29 DIAGNOSIS — D6869 Other thrombophilia: Secondary | ICD-10-CM

## 2024-03-29 DIAGNOSIS — Z950 Presence of cardiac pacemaker: Secondary | ICD-10-CM | POA: Diagnosis not present

## 2024-03-29 DIAGNOSIS — Z9889 Other specified postprocedural states: Secondary | ICD-10-CM

## 2024-03-29 DIAGNOSIS — Z952 Presence of prosthetic heart valve: Secondary | ICD-10-CM

## 2024-03-29 LAB — CUP PACEART INCLINIC DEVICE CHECK
Battery Remaining Longevity: 107 mo
Battery Voltage: 3.06 V
Brady Statistic AP VP Percent: 0 %
Brady Statistic AP VS Percent: 0 %
Brady Statistic AS VP Percent: 89.29 %
Brady Statistic AS VS Percent: 10.71 %
Brady Statistic RA Percent Paced: 0 %
Brady Statistic RV Percent Paced: 89.29 %
Date Time Interrogation Session: 20250522173736
Implantable Lead Connection Status: 753985
Implantable Lead Connection Status: 753985
Implantable Lead Connection Status: 753985
Implantable Lead Implant Date: 20180111
Implantable Lead Implant Date: 20180111
Implantable Lead Implant Date: 20180111
Implantable Lead Location: 753858
Implantable Lead Location: 753859
Implantable Lead Location: 753860
Implantable Lead Model: 4398
Implantable Lead Model: 5076
Implantable Lead Model: 5076
Implantable Pulse Generator Implant Date: 20250218
Lead Channel Impedance Value: 1007 Ohm
Lead Channel Impedance Value: 361 Ohm
Lead Channel Impedance Value: 361 Ohm
Lead Channel Impedance Value: 494 Ohm
Lead Channel Impedance Value: 513 Ohm
Lead Channel Impedance Value: 532 Ohm
Lead Channel Impedance Value: 608 Ohm
Lead Channel Impedance Value: 703 Ohm
Lead Channel Impedance Value: 760 Ohm
Lead Channel Pacing Threshold Amplitude: 0.875 V
Lead Channel Pacing Threshold Amplitude: 1.125 V
Lead Channel Pacing Threshold Pulse Width: 0.4 ms
Lead Channel Pacing Threshold Pulse Width: 1 ms
Lead Channel Sensing Intrinsic Amplitude: 0.5 mV
Lead Channel Sensing Intrinsic Amplitude: 3.375 mV
Lead Channel Sensing Intrinsic Amplitude: 5.75 mV
Lead Channel Setting Pacing Amplitude: 1.75 V
Lead Channel Setting Pacing Amplitude: 2 V
Lead Channel Setting Pacing Pulse Width: 0.4 ms
Lead Channel Setting Pacing Pulse Width: 1 ms
Lead Channel Setting Sensing Sensitivity: 1.2 mV
Zone Setting Status: 755011

## 2024-03-29 NOTE — Patient Instructions (Signed)
 Medication Instructions:   Your physician recommends that you continue on your current medications as directed. Please refer to the Current Medication list given to you today.   *If you need a refill on your cardiac medications before your next appointment, please call your pharmacy*   Lab Work:  NONE ORDERED  TODAY    If you have labs (blood work) drawn today and your tests are completely normal, you will receive your results only by: MyChart Message (if you have MyChart) OR A paper copy in the mail If you have any lab test that is abnormal or we need to change your treatment, we will call you to review the results.   Testing/Procedures: NONE ORDERED  TODAY     Follow-Up: At [facility], you and your health needs are our priority.  As part of our continuing mission to provide you with exceptional heart care, our providers are all part of one team.  This team includes your primary Cardiologist (physician) and Advanced Practice Providers or APPs (Physician Assistants and Nurse Practitioners) who all work together to provide you with the care you need, when you need it.  Your next appointment:  WITH DR Swaziland / APP ASAP OVERDUE  SINCE 2022   1 year(s)   Provider:    You may see    Mertha Abrahams, PA-C   We recommend signing up for the patient portal called "MyChart".  Sign up information is provided on this After Visit Summary.  MyChart is used to connect with patients for Virtual Visits (Telemedicine).  Patients are able to view lab/test results, encounter notes, upcoming appointments, etc.  Non-urgent messages can be sent to your provider as well.   To learn more about what you can do with MyChart, go to [email or web address].   Other Instructions

## 2024-05-14 ENCOUNTER — Ambulatory Visit: Payer: Medicare HMO

## 2024-05-14 DIAGNOSIS — I442 Atrioventricular block, complete: Secondary | ICD-10-CM | POA: Diagnosis not present

## 2024-05-15 LAB — CUP PACEART REMOTE DEVICE CHECK
Battery Remaining Longevity: 103 mo
Battery Voltage: 3.04 V
Brady Statistic AP VP Percent: 0 %
Brady Statistic AP VS Percent: 0 %
Brady Statistic AS VP Percent: 90.1 %
Brady Statistic AS VS Percent: 9.9 %
Brady Statistic RA Percent Paced: 0 %
Brady Statistic RV Percent Paced: 90.1 %
Date Time Interrogation Session: 20250706224658
Implantable Lead Connection Status: 753985
Implantable Lead Connection Status: 753985
Implantable Lead Connection Status: 753985
Implantable Lead Implant Date: 20180111
Implantable Lead Implant Date: 20180111
Implantable Lead Implant Date: 20180111
Implantable Lead Location: 753858
Implantable Lead Location: 753859
Implantable Lead Location: 753860
Implantable Lead Model: 4398
Implantable Lead Model: 5076
Implantable Lead Model: 5076
Implantable Pulse Generator Implant Date: 20250218
Lead Channel Impedance Value: 323 Ohm
Lead Channel Impedance Value: 361 Ohm
Lead Channel Impedance Value: 456 Ohm
Lead Channel Impedance Value: 513 Ohm
Lead Channel Impedance Value: 513 Ohm
Lead Channel Impedance Value: 551 Ohm
Lead Channel Impedance Value: 722 Ohm
Lead Channel Impedance Value: 722 Ohm
Lead Channel Impedance Value: 950 Ohm
Lead Channel Pacing Threshold Amplitude: 0.875 V
Lead Channel Pacing Threshold Amplitude: 1.125 V
Lead Channel Pacing Threshold Pulse Width: 0.4 ms
Lead Channel Pacing Threshold Pulse Width: 1 ms
Lead Channel Sensing Intrinsic Amplitude: 0.5 mV
Lead Channel Sensing Intrinsic Amplitude: 3.125 mV
Lead Channel Sensing Intrinsic Amplitude: 3.125 mV
Lead Channel Setting Pacing Amplitude: 1.75 V
Lead Channel Setting Pacing Amplitude: 2 V
Lead Channel Setting Pacing Pulse Width: 0.4 ms
Lead Channel Setting Pacing Pulse Width: 1 ms
Lead Channel Setting Sensing Sensitivity: 1.2 mV
Zone Setting Status: 755011

## 2024-05-17 ENCOUNTER — Ambulatory Visit: Payer: Self-pay | Admitting: Internal Medicine
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
## 2024-06-01 ENCOUNTER — Encounter: Payer: Self-pay | Admitting: Cardiology

## 2024-06-01 ENCOUNTER — Ambulatory Visit: Attending: Cardiology | Admitting: Cardiology

## 2024-06-01 VITALS — BP 115/50 | HR 88 | Ht 70.0 in | Wt 177.4 lb

## 2024-06-01 DIAGNOSIS — I442 Atrioventricular block, complete: Secondary | ICD-10-CM

## 2024-06-01 DIAGNOSIS — I4821 Permanent atrial fibrillation: Secondary | ICD-10-CM | POA: Diagnosis not present

## 2024-06-01 DIAGNOSIS — Z952 Presence of prosthetic heart valve: Secondary | ICD-10-CM | POA: Diagnosis not present

## 2024-06-01 NOTE — Patient Instructions (Signed)

## 2024-06-15 DIAGNOSIS — Z7901 Long term (current) use of anticoagulants: Secondary | ICD-10-CM | POA: Diagnosis not present

## 2024-07-16 DIAGNOSIS — Z7901 Long term (current) use of anticoagulants: Secondary | ICD-10-CM | POA: Diagnosis not present

## 2024-08-08 DIAGNOSIS — H52223 Regular astigmatism, bilateral: Secondary | ICD-10-CM | POA: Diagnosis not present

## 2024-08-08 DIAGNOSIS — H524 Presbyopia: Secondary | ICD-10-CM | POA: Diagnosis not present

## 2024-08-08 DIAGNOSIS — H5203 Hypermetropia, bilateral: Secondary | ICD-10-CM | POA: Diagnosis not present

## 2024-08-08 DIAGNOSIS — H40039 Anatomical narrow angle, unspecified eye: Secondary | ICD-10-CM | POA: Diagnosis not present

## 2024-08-08 DIAGNOSIS — H2513 Age-related nuclear cataract, bilateral: Secondary | ICD-10-CM | POA: Diagnosis not present

## 2024-08-13 ENCOUNTER — Ambulatory Visit: Payer: Medicare HMO

## 2024-08-13 DIAGNOSIS — Z7901 Long term (current) use of anticoagulants: Secondary | ICD-10-CM | POA: Diagnosis not present

## 2024-08-13 DIAGNOSIS — I4821 Permanent atrial fibrillation: Secondary | ICD-10-CM | POA: Diagnosis not present

## 2024-08-15 LAB — CUP PACEART REMOTE DEVICE CHECK
Battery Remaining Longevity: 106 mo
Battery Voltage: 3.02 V
Brady Statistic AP VP Percent: 0 %
Brady Statistic AP VS Percent: 0 %
Brady Statistic AS VP Percent: 90.13 %
Brady Statistic AS VS Percent: 9.87 %
Brady Statistic RA Percent Paced: 0 %
Brady Statistic RV Percent Paced: 90.13 %
Date Time Interrogation Session: 20251006112524
Implantable Lead Connection Status: 753985
Implantable Lead Connection Status: 753985
Implantable Lead Connection Status: 753985
Implantable Lead Implant Date: 20180111
Implantable Lead Implant Date: 20180111
Implantable Lead Implant Date: 20180111
Implantable Lead Location: 753858
Implantable Lead Location: 753859
Implantable Lead Location: 753860
Implantable Lead Model: 4398
Implantable Lead Model: 5076
Implantable Lead Model: 5076
Implantable Pulse Generator Implant Date: 20250218
Lead Channel Impedance Value: 1045 Ohm
Lead Channel Impedance Value: 342 Ohm
Lead Channel Impedance Value: 361 Ohm
Lead Channel Impedance Value: 475 Ohm
Lead Channel Impedance Value: 513 Ohm
Lead Channel Impedance Value: 570 Ohm
Lead Channel Impedance Value: 608 Ohm
Lead Channel Impedance Value: 760 Ohm
Lead Channel Impedance Value: 779 Ohm
Lead Channel Pacing Threshold Amplitude: 0.875 V
Lead Channel Pacing Threshold Amplitude: 1 V
Lead Channel Pacing Threshold Pulse Width: 0.4 ms
Lead Channel Pacing Threshold Pulse Width: 1 ms
Lead Channel Sensing Intrinsic Amplitude: 0.5 mV
Lead Channel Sensing Intrinsic Amplitude: 3.25 mV
Lead Channel Sensing Intrinsic Amplitude: 3.25 mV
Lead Channel Setting Pacing Amplitude: 1.5 V
Lead Channel Setting Pacing Amplitude: 2 V
Lead Channel Setting Pacing Pulse Width: 0.4 ms
Lead Channel Setting Pacing Pulse Width: 1 ms
Lead Channel Setting Sensing Sensitivity: 1.2 mV
Zone Setting Status: 755011

## 2024-08-16 ENCOUNTER — Ambulatory Visit: Payer: Self-pay | Admitting: Internal Medicine

## 2024-08-16 NOTE — Progress Notes (Signed)
 Remote PPM Transmission

## 2024-08-20 NOTE — Progress Notes (Signed)
 Remote PPM Transmission

## 2024-08-28 DIAGNOSIS — Z7901 Long term (current) use of anticoagulants: Secondary | ICD-10-CM | POA: Diagnosis not present

## 2024-09-28 DIAGNOSIS — Z7901 Long term (current) use of anticoagulants: Secondary | ICD-10-CM | POA: Diagnosis not present

## 2024-10-10 DIAGNOSIS — Z7901 Long term (current) use of anticoagulants: Secondary | ICD-10-CM | POA: Diagnosis not present

## 2024-10-10 DIAGNOSIS — R972 Elevated prostate specific antigen [PSA]: Secondary | ICD-10-CM | POA: Diagnosis not present

## 2024-10-10 DIAGNOSIS — J439 Emphysema, unspecified: Secondary | ICD-10-CM | POA: Diagnosis not present

## 2024-10-10 DIAGNOSIS — I5022 Chronic systolic (congestive) heart failure: Secondary | ICD-10-CM | POA: Diagnosis not present

## 2024-10-10 DIAGNOSIS — Z0000 Encounter for general adult medical examination without abnormal findings: Secondary | ICD-10-CM | POA: Diagnosis not present

## 2024-10-10 DIAGNOSIS — I4819 Other persistent atrial fibrillation: Secondary | ICD-10-CM | POA: Diagnosis not present

## 2024-10-10 DIAGNOSIS — Z950 Presence of cardiac pacemaker: Secondary | ICD-10-CM | POA: Diagnosis not present

## 2024-10-10 DIAGNOSIS — Z952 Presence of prosthetic heart valve: Secondary | ICD-10-CM | POA: Diagnosis not present

## 2024-10-10 DIAGNOSIS — I4891 Unspecified atrial fibrillation: Secondary | ICD-10-CM | POA: Diagnosis not present

## 2024-10-10 DIAGNOSIS — R7309 Other abnormal glucose: Secondary | ICD-10-CM | POA: Diagnosis not present

## 2024-10-10 DIAGNOSIS — D6869 Other thrombophilia: Secondary | ICD-10-CM | POA: Diagnosis not present

## 2024-10-10 DIAGNOSIS — E7800 Pure hypercholesterolemia, unspecified: Secondary | ICD-10-CM | POA: Diagnosis not present

## 2024-11-09 ENCOUNTER — Other Ambulatory Visit: Payer: Self-pay

## 2024-11-09 DIAGNOSIS — F17210 Nicotine dependence, cigarettes, uncomplicated: Secondary | ICD-10-CM

## 2024-11-09 DIAGNOSIS — Z122 Encounter for screening for malignant neoplasm of respiratory organs: Secondary | ICD-10-CM

## 2024-11-09 DIAGNOSIS — Z87891 Personal history of nicotine dependence: Secondary | ICD-10-CM

## 2024-11-12 ENCOUNTER — Ambulatory Visit: Payer: Medicare HMO

## 2024-11-12 DIAGNOSIS — I4821 Permanent atrial fibrillation: Secondary | ICD-10-CM | POA: Diagnosis not present

## 2024-11-14 LAB — CUP PACEART REMOTE DEVICE CHECK
Battery Remaining Longevity: 103 mo
Battery Voltage: 3.02 V
Brady Statistic AP VP Percent: 0 %
Brady Statistic AP VS Percent: 0 %
Brady Statistic AS VP Percent: 89.23 %
Brady Statistic AS VS Percent: 10.77 %
Brady Statistic RA Percent Paced: 0 %
Brady Statistic RV Percent Paced: 89.22 %
Date Time Interrogation Session: 20260105022018
Implantable Lead Connection Status: 753985
Implantable Lead Connection Status: 753985
Implantable Lead Connection Status: 753985
Implantable Lead Implant Date: 20180111
Implantable Lead Implant Date: 20180111
Implantable Lead Implant Date: 20180111
Implantable Lead Location: 753858
Implantable Lead Location: 753859
Implantable Lead Location: 753860
Implantable Lead Model: 4398
Implantable Lead Model: 5076
Implantable Lead Model: 5076
Implantable Pulse Generator Implant Date: 20250218
Lead Channel Impedance Value: 1026 Ohm
Lead Channel Impedance Value: 342 Ohm
Lead Channel Impedance Value: 361 Ohm
Lead Channel Impedance Value: 475 Ohm
Lead Channel Impedance Value: 513 Ohm
Lead Channel Impedance Value: 551 Ohm
Lead Channel Impedance Value: 608 Ohm
Lead Channel Impedance Value: 722 Ohm
Lead Channel Impedance Value: 760 Ohm
Lead Channel Pacing Threshold Amplitude: 1 V
Lead Channel Pacing Threshold Amplitude: 1 V
Lead Channel Pacing Threshold Pulse Width: 0.4 ms
Lead Channel Pacing Threshold Pulse Width: 1 ms
Lead Channel Sensing Intrinsic Amplitude: 0.5 mV
Lead Channel Sensing Intrinsic Amplitude: 3.5 mV
Lead Channel Sensing Intrinsic Amplitude: 3.5 mV
Lead Channel Setting Pacing Amplitude: 1.5 V
Lead Channel Setting Pacing Amplitude: 2 V
Lead Channel Setting Pacing Pulse Width: 0.4 ms
Lead Channel Setting Pacing Pulse Width: 1 ms
Lead Channel Setting Sensing Sensitivity: 1.2 mV
Zone Setting Status: 755011

## 2024-11-15 NOTE — Progress Notes (Signed)
 Remote PPM Transmission

## 2024-11-17 ENCOUNTER — Ambulatory Visit: Payer: Self-pay | Admitting: Cardiology
